# Patient Record
Sex: Female | Born: 1970
Health system: Southern US, Community
[De-identification: ages and names within clinical notes are randomized; demographics above are authoritative.]

## PROBLEM LIST (undated history)

## (undated) DIAGNOSIS — D0512 Intraductal carcinoma in situ of left breast: Secondary | ICD-10-CM

## (undated) DIAGNOSIS — Z1379 Encounter for other screening for genetic and chromosomal anomalies: Principal | ICD-10-CM

## (undated) DIAGNOSIS — I10 Essential (primary) hypertension: Secondary | ICD-10-CM

## (undated) DIAGNOSIS — R112 Nausea with vomiting, unspecified: Secondary | ICD-10-CM

## (undated) DIAGNOSIS — Z9889 Other specified postprocedural states: Secondary | ICD-10-CM

## (undated) DIAGNOSIS — D259 Leiomyoma of uterus, unspecified: Secondary | ICD-10-CM

## (undated) DIAGNOSIS — Z803 Family history of malignant neoplasm of breast: Secondary | ICD-10-CM

## (undated) DIAGNOSIS — F419 Anxiety disorder, unspecified: Secondary | ICD-10-CM

## (undated) DIAGNOSIS — Z8041 Family history of malignant neoplasm of ovary: Secondary | ICD-10-CM

## (undated) DIAGNOSIS — D649 Anemia, unspecified: Secondary | ICD-10-CM

## (undated) HISTORY — PX: MASTECTOMY: SHX3

## (undated) HISTORY — DX: Family history of malignant neoplasm of breast: Z80.3

## (undated) HISTORY — DX: Anxiety disorder, unspecified: F41.9

## (undated) HISTORY — PX: TUBAL LIGATION: SHX77

## (undated) HISTORY — DX: Family history of malignant neoplasm of ovary: Z80.41

## (undated) HISTORY — DX: Intraductal carcinoma in situ of left breast: D05.12

## (undated) HISTORY — PX: AXILLARY SURGERY: SHX892

## (undated) HISTORY — DX: Encounter for other screening for genetic and chromosomal anomalies: Z13.79

---

## 2000-07-08 ENCOUNTER — Emergency Department (HOSPITAL_COMMUNITY): Admission: EM | Admit: 2000-07-08 | Discharge: 2000-07-08 | Payer: Self-pay | Admitting: *Deleted

## 2001-08-18 ENCOUNTER — Emergency Department (HOSPITAL_COMMUNITY): Admission: EM | Admit: 2001-08-18 | Discharge: 2001-08-18 | Payer: Self-pay | Admitting: Emergency Medicine

## 2002-02-14 ENCOUNTER — Emergency Department (HOSPITAL_COMMUNITY): Admission: EM | Admit: 2002-02-14 | Discharge: 2002-02-15 | Payer: Self-pay | Admitting: Emergency Medicine

## 2002-10-24 ENCOUNTER — Inpatient Hospital Stay (HOSPITAL_COMMUNITY): Admission: AD | Admit: 2002-10-24 | Discharge: 2002-10-24 | Payer: Self-pay | Admitting: Obstetrics

## 2002-10-28 ENCOUNTER — Emergency Department (HOSPITAL_COMMUNITY): Admission: EM | Admit: 2002-10-28 | Discharge: 2002-10-28 | Payer: Self-pay | Admitting: Emergency Medicine

## 2002-12-16 ENCOUNTER — Emergency Department (HOSPITAL_COMMUNITY): Admission: EM | Admit: 2002-12-16 | Discharge: 2002-12-16 | Payer: Self-pay

## 2003-01-13 ENCOUNTER — Emergency Department (HOSPITAL_COMMUNITY): Admission: EM | Admit: 2003-01-13 | Discharge: 2003-01-13 | Payer: Self-pay | Admitting: *Deleted

## 2004-01-21 ENCOUNTER — Emergency Department (HOSPITAL_COMMUNITY): Admission: AD | Admit: 2004-01-21 | Discharge: 2004-01-21 | Payer: Self-pay | Admitting: Family Medicine

## 2004-05-13 ENCOUNTER — Emergency Department (HOSPITAL_COMMUNITY): Admission: EM | Admit: 2004-05-13 | Discharge: 2004-05-13 | Payer: Self-pay | Admitting: Family Medicine

## 2004-08-05 ENCOUNTER — Emergency Department (HOSPITAL_COMMUNITY): Admission: EM | Admit: 2004-08-05 | Discharge: 2004-08-05 | Payer: Self-pay | Admitting: Family Medicine

## 2004-12-22 ENCOUNTER — Emergency Department (HOSPITAL_COMMUNITY): Admission: EM | Admit: 2004-12-22 | Discharge: 2004-12-22 | Payer: Self-pay | Admitting: Family Medicine

## 2005-01-07 ENCOUNTER — Emergency Department (HOSPITAL_COMMUNITY): Admission: EM | Admit: 2005-01-07 | Discharge: 2005-01-07 | Payer: Self-pay | Admitting: Family Medicine

## 2005-12-02 ENCOUNTER — Emergency Department (HOSPITAL_COMMUNITY): Admission: EM | Admit: 2005-12-02 | Discharge: 2005-12-02 | Payer: Self-pay | Admitting: Family Medicine

## 2006-06-10 ENCOUNTER — Emergency Department (HOSPITAL_COMMUNITY): Admission: EM | Admit: 2006-06-10 | Discharge: 2006-06-10 | Payer: Self-pay | Admitting: Family Medicine

## 2007-12-22 ENCOUNTER — Emergency Department (HOSPITAL_COMMUNITY): Admission: EM | Admit: 2007-12-22 | Discharge: 2007-12-22 | Payer: Self-pay | Admitting: Family Medicine

## 2008-01-05 ENCOUNTER — Emergency Department (HOSPITAL_COMMUNITY): Admission: EM | Admit: 2008-01-05 | Discharge: 2008-01-05 | Payer: Self-pay | Admitting: Emergency Medicine

## 2008-01-08 ENCOUNTER — Emergency Department (HOSPITAL_COMMUNITY): Admission: EM | Admit: 2008-01-08 | Discharge: 2008-01-08 | Payer: Self-pay | Admitting: Family Medicine

## 2009-12-17 ENCOUNTER — Emergency Department (HOSPITAL_COMMUNITY): Admission: EM | Admit: 2009-12-17 | Discharge: 2009-12-18 | Payer: Self-pay | Admitting: Emergency Medicine

## 2009-12-17 ENCOUNTER — Emergency Department (HOSPITAL_COMMUNITY): Admission: EM | Admit: 2009-12-17 | Discharge: 2009-12-17 | Payer: Self-pay | Admitting: Family Medicine

## 2009-12-28 ENCOUNTER — Emergency Department (HOSPITAL_COMMUNITY): Admission: EM | Admit: 2009-12-28 | Discharge: 2009-12-28 | Payer: Self-pay | Admitting: Family Medicine

## 2010-01-16 ENCOUNTER — Ambulatory Visit: Payer: Self-pay | Admitting: Obstetrics and Gynecology

## 2010-04-05 ENCOUNTER — Emergency Department (HOSPITAL_COMMUNITY): Admission: EM | Admit: 2010-04-05 | Discharge: 2010-04-05 | Payer: Self-pay | Admitting: Family Medicine

## 2010-04-11 ENCOUNTER — Ambulatory Visit: Payer: Self-pay | Admitting: Obstetrics & Gynecology

## 2010-05-16 ENCOUNTER — Ambulatory Visit: Payer: Self-pay | Admitting: Obstetrics and Gynecology

## 2011-01-04 LAB — POCT I-STAT, CHEM 8
BUN: 8 mg/dL (ref 6–23)
Calcium, Ion: 1.07 mmol/L — ABNORMAL LOW (ref 1.12–1.32)
Chloride: 102 mEq/L (ref 96–112)
Creatinine, Ser: 0.9 mg/dL (ref 0.4–1.2)
Glucose, Bld: 95 mg/dL (ref 70–99)
HCT: 38 % (ref 36.0–46.0)
Hemoglobin: 12.9 g/dL (ref 12.0–15.0)
Potassium: 3.5 mEq/L (ref 3.5–5.1)
Sodium: 139 mEq/L (ref 135–145)
TCO2: 26 mmol/L (ref 0–100)

## 2011-01-11 LAB — POCT URINALYSIS DIP (DEVICE)
Bilirubin Urine: NEGATIVE
Glucose, UA: NEGATIVE mg/dL
Hgb urine dipstick: NEGATIVE
Nitrite: NEGATIVE
Protein, ur: 30 mg/dL — AB
Specific Gravity, Urine: 1.015 (ref 1.005–1.030)
Urobilinogen, UA: 1 mg/dL (ref 0.0–1.0)
pH: 7 (ref 5.0–8.0)

## 2011-01-11 LAB — URINALYSIS, ROUTINE W REFLEX MICROSCOPIC
Bilirubin Urine: NEGATIVE
Glucose, UA: NEGATIVE mg/dL
Hgb urine dipstick: NEGATIVE
Ketones, ur: NEGATIVE mg/dL
Nitrite: NEGATIVE
Protein, ur: NEGATIVE mg/dL
Specific Gravity, Urine: 1.028 (ref 1.005–1.030)
Urobilinogen, UA: 1 mg/dL (ref 0.0–1.0)
pH: 5.5 (ref 5.0–8.0)

## 2011-01-11 LAB — POCT PREGNANCY, URINE: Preg Test, Ur: NEGATIVE

## 2011-01-11 LAB — POCT RAPID STREP A (OFFICE): Streptococcus, Group A Screen (Direct): NEGATIVE

## 2011-01-11 LAB — SYPHILIS: RPR W/REFLEX TO RPR TITER AND TREPONEMAL ANTIBODIES, TRADITIONAL SCREENING AND DIAGNOSIS ALGORITHM: RPR Ser Ql: NONREACTIVE

## 2011-01-11 LAB — CBC
MCHC: 31.1 g/dL (ref 30.0–36.0)
MCV: 65.9 fL — ABNORMAL LOW (ref 78.0–100.0)
RDW: 21.1 % — ABNORMAL HIGH (ref 11.5–15.5)

## 2011-01-31 ENCOUNTER — Emergency Department (HOSPITAL_COMMUNITY)
Admission: EM | Admit: 2011-01-31 | Discharge: 2011-01-31 | Disposition: A | Discharge status: Home / Self Care | Payer: Self-pay | Attending: Emergency Medicine | Admitting: Emergency Medicine

## 2011-01-31 DIAGNOSIS — J309 Allergic rhinitis, unspecified: Secondary | ICD-10-CM | POA: Insufficient documentation

## 2011-01-31 DIAGNOSIS — J3489 Other specified disorders of nose and nasal sinuses: Secondary | ICD-10-CM | POA: Insufficient documentation

## 2011-01-31 DIAGNOSIS — R220 Localized swelling, mass and lump, head: Secondary | ICD-10-CM | POA: Insufficient documentation

## 2011-01-31 DIAGNOSIS — K089 Disorder of teeth and supporting structures, unspecified: Secondary | ICD-10-CM | POA: Insufficient documentation

## 2011-01-31 DIAGNOSIS — S025XXA Fracture of tooth (traumatic), initial encounter for closed fracture: Secondary | ICD-10-CM | POA: Insufficient documentation

## 2011-01-31 DIAGNOSIS — K029 Dental caries, unspecified: Secondary | ICD-10-CM | POA: Insufficient documentation

## 2011-01-31 DIAGNOSIS — X58XXXA Exposure to other specified factors, initial encounter: Secondary | ICD-10-CM | POA: Insufficient documentation

## 2011-01-31 DIAGNOSIS — Y929 Unspecified place or not applicable: Secondary | ICD-10-CM | POA: Insufficient documentation

## 2011-01-31 DIAGNOSIS — J329 Chronic sinusitis, unspecified: Secondary | ICD-10-CM | POA: Insufficient documentation

## 2011-01-31 DIAGNOSIS — R059 Cough, unspecified: Secondary | ICD-10-CM | POA: Insufficient documentation

## 2011-01-31 DIAGNOSIS — R51 Headache: Secondary | ICD-10-CM | POA: Insufficient documentation

## 2011-01-31 DIAGNOSIS — R05 Cough: Secondary | ICD-10-CM | POA: Insufficient documentation

## 2011-05-01 ENCOUNTER — Inpatient Hospital Stay (INDEPENDENT_AMBULATORY_CARE_PROVIDER_SITE_OTHER)
Admission: RE | Admit: 2011-05-01 | Discharge: 2011-05-01 | Disposition: A | Discharge status: Home / Self Care | Payer: Self-pay | Source: Ambulatory Visit | Attending: Family Medicine | Admitting: Family Medicine

## 2011-05-01 DIAGNOSIS — J069 Acute upper respiratory infection, unspecified: Secondary | ICD-10-CM

## 2011-05-01 DIAGNOSIS — R000 Tachycardia, unspecified: Secondary | ICD-10-CM

## 2011-07-13 LAB — INFLUENZA A AND B ANTIGEN (CONVERTED LAB): Inflenza A Ag: NEGATIVE

## 2011-10-20 HISTORY — PX: KNEE ARTHROSCOPY: SUR90

## 2012-05-18 ENCOUNTER — Emergency Department (HOSPITAL_COMMUNITY)
Admission: EM | Admit: 2012-05-18 | Discharge: 2012-05-18 | Disposition: A | Discharge status: Home / Self Care | Payer: Self-pay

## 2014-09-28 ENCOUNTER — Encounter (HOSPITAL_COMMUNITY): Payer: Self-pay | Admitting: Vascular Surgery

## 2014-09-28 ENCOUNTER — Observation Stay (HOSPITAL_COMMUNITY)
Admission: EM | Admit: 2014-09-28 | Discharge: 2014-09-29 | Disposition: A | Discharge status: Home / Self Care | Payer: No Typology Code available for payment source | Attending: Internal Medicine | Admitting: Internal Medicine

## 2014-09-28 DIAGNOSIS — Y9389 Activity, other specified: Secondary | ICD-10-CM | POA: Diagnosis not present

## 2014-09-28 DIAGNOSIS — Y998 Other external cause status: Secondary | ICD-10-CM | POA: Insufficient documentation

## 2014-09-28 DIAGNOSIS — D649 Anemia, unspecified: Secondary | ICD-10-CM | POA: Diagnosis not present

## 2014-09-28 DIAGNOSIS — Y92488 Other paved roadways as the place of occurrence of the external cause: Secondary | ICD-10-CM | POA: Insufficient documentation

## 2014-09-28 DIAGNOSIS — N920 Excessive and frequent menstruation with regular cycle: Secondary | ICD-10-CM | POA: Insufficient documentation

## 2014-09-28 DIAGNOSIS — R000 Tachycardia, unspecified: Secondary | ICD-10-CM | POA: Insufficient documentation

## 2014-09-28 DIAGNOSIS — D509 Iron deficiency anemia, unspecified: Secondary | ICD-10-CM | POA: Diagnosis not present

## 2014-09-28 DIAGNOSIS — D259 Leiomyoma of uterus, unspecified: Secondary | ICD-10-CM | POA: Diagnosis not present

## 2014-09-28 DIAGNOSIS — I10 Essential (primary) hypertension: Secondary | ICD-10-CM | POA: Diagnosis not present

## 2014-09-28 HISTORY — DX: Leiomyoma of uterus, unspecified: D25.9

## 2014-09-28 HISTORY — DX: Essential (primary) hypertension: I10

## 2014-09-28 MED ORDER — DIAZEPAM 5 MG PO TABS
5.0000 mg | ORAL_TABLET | Freq: Once | ORAL | Status: AC
  Administered 2014-09-28: 5 mg via ORAL
  Filled 2014-09-28: qty 1

## 2014-09-28 MED ORDER — OXYCODONE-ACETAMINOPHEN 5-325 MG PO TABS
2.0000 | ORAL_TABLET | Freq: Once | ORAL | Status: AC
  Administered 2014-09-28: 2 via ORAL
  Filled 2014-09-28: qty 2
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
## 2014-09-28 NOTE — ED Notes (Signed)
Pt ambulatory to the bathroom 
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
## 2014-09-28 NOTE — ED Notes (Signed)
Pt stating pain is better and she is "ready to go home."
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
## 2014-09-28 NOTE — ED Notes (Signed)
MD at bedside. 

## 2014-09-29 ENCOUNTER — Encounter (HOSPITAL_COMMUNITY): Payer: Self-pay | Admitting: Internal Medicine

## 2014-09-29 DIAGNOSIS — D500 Iron deficiency anemia secondary to blood loss (chronic): Secondary | ICD-10-CM

## 2014-09-29 DIAGNOSIS — N920 Excessive and frequent menstruation with regular cycle: Secondary | ICD-10-CM | POA: Diagnosis present

## 2014-09-29 DIAGNOSIS — R000 Tachycardia, unspecified: Secondary | ICD-10-CM | POA: Insufficient documentation

## 2014-09-29 DIAGNOSIS — D259 Leiomyoma of uterus, unspecified: Secondary | ICD-10-CM | POA: Diagnosis present

## 2014-09-29 DIAGNOSIS — N924 Excessive bleeding in the premenopausal period: Secondary | ICD-10-CM

## 2014-09-29 DIAGNOSIS — D649 Anemia, unspecified: Secondary | ICD-10-CM | POA: Diagnosis present

## 2014-09-29 LAB — CBC WITH DIFFERENTIAL/PLATELET
BASOS PCT: 0 % (ref 0–1)
Basophils Absolute: 0 10*3/uL (ref 0.0–0.1)
EOS ABS: 0 10*3/uL (ref 0.0–0.7)
Eosinophils Relative: 0 % (ref 0–5)
HEMATOCRIT: 25.2 % — AB (ref 36.0–46.0)
HEMOGLOBIN: 6.7 g/dL — AB (ref 12.0–15.0)
Lymphocytes Relative: 18 % (ref 12–46)
Lymphs Abs: 1.4 10*3/uL (ref 0.7–4.0)
MCH: 15.1 pg — AB (ref 26.0–34.0)
MCHC: 26.6 g/dL — AB (ref 30.0–36.0)
MCV: 56.9 fL — ABNORMAL LOW (ref 78.0–100.0)
MONO ABS: 0.5 10*3/uL (ref 0.1–1.0)
Monocytes Relative: 7 % (ref 3–12)
NEUTROS ABS: 5.7 10*3/uL (ref 1.7–7.7)
Neutrophils Relative %: 75 % (ref 43–77)
Platelets: 565 10*3/uL — ABNORMAL HIGH (ref 150–400)
RBC: 4.43 MIL/uL (ref 3.87–5.11)
RDW: 22.8 % — ABNORMAL HIGH (ref 11.5–15.5)
WBC: 7.6 10*3/uL (ref 4.0–10.5)

## 2014-09-29 LAB — BASIC METABOLIC PANEL
Anion gap: 15 (ref 5–15)
BUN: 8 mg/dL (ref 6–23)
CALCIUM: 9.2 mg/dL (ref 8.4–10.5)
CO2: 23 meq/L (ref 19–32)
CREATININE: 0.73 mg/dL (ref 0.50–1.10)
Chloride: 101 mEq/L (ref 96–112)
GFR calc Af Amer: >90 mL/min (ref >90)
GFR calc non Af Amer: >90 mL/min (ref >90)
GLUCOSE: 121 mg/dL — AB (ref 70–99)
Potassium: 3.7 mEq/L (ref 3.7–5.3)
Sodium: 139 mEq/L (ref 137–147)

## 2014-09-29 LAB — IRON AND TIBC
Iron: 181 ug/dL — ABNORMAL HIGH (ref 42–135)
Saturation Ratios: 51 % (ref 20–55)
TIBC: 357 ug/dL (ref 250–470)
UIBC: 176 ug/dL (ref 125–400)

## 2014-09-29 LAB — URINALYSIS, ROUTINE W REFLEX MICROSCOPIC
BILIRUBIN URINE: NEGATIVE
GLUCOSE, UA: NEGATIVE mg/dL
KETONES UR: NEGATIVE mg/dL
LEUKOCYTES UA: NEGATIVE
Nitrite: NEGATIVE
PH: 6 (ref 5.0–8.0)
Protein, ur: NEGATIVE mg/dL
Specific Gravity, Urine: <1.005 — ABNORMAL LOW (ref 1.005–1.030)
Urobilinogen, UA: 0.2 mg/dL (ref 0.0–1.0)

## 2014-09-29 LAB — RAPID URINE DRUG SCREEN, HOSP PERFORMED
AMPHETAMINES: NOT DETECTED
BARBITURATES: NOT DETECTED
BENZODIAZEPINES: NOT DETECTED
Cocaine: NOT DETECTED
Opiates: NOT DETECTED
Tetrahydrocannabinol: NOT DETECTED

## 2014-09-29 LAB — URINE MICROSCOPIC-ADD ON

## 2014-09-29 LAB — CBC
HCT: 29.3 % — ABNORMAL LOW (ref 36.0–46.0)
HEMATOCRIT: 26.5 % — AB (ref 36.0–46.0)
HEMOGLOBIN: 7.4 g/dL — AB (ref 12.0–15.0)
HEMOGLOBIN: 8.6 g/dL — AB (ref 12.0–15.0)
MCH: 16.9 pg — AB (ref 26.0–34.0)
MCH: 18.6 pg — ABNORMAL LOW (ref 26.0–34.0)
MCHC: 27.9 g/dL — ABNORMAL LOW (ref 30.0–36.0)
MCHC: 29.4 g/dL — ABNORMAL LOW (ref 30.0–36.0)
MCV: 60.4 fL — AB (ref 78.0–100.0)
MCV: 63.3 fL — AB (ref 78.0–100.0)
PLATELETS: 500 10*3/uL — AB (ref 150–400)
Platelets: 471 10*3/uL — ABNORMAL HIGH (ref 150–400)
RBC: 4.39 MIL/uL (ref 3.87–5.11)
RBC: 4.63 MIL/uL (ref 3.87–5.11)
RDW: 25.4 % — ABNORMAL HIGH (ref 11.5–15.5)
RDW: 26.1 % — ABNORMAL HIGH (ref 11.5–15.5)
WBC: 6 10*3/uL (ref 4.0–10.5)
WBC: 6.2 10*3/uL (ref 4.0–10.5)

## 2014-09-29 LAB — TSH: TSH: 1.29 u[IU]/mL (ref 0.350–4.500)

## 2014-09-29 LAB — PREPARE RBC (CROSSMATCH)

## 2014-09-29 LAB — TROPONIN I

## 2014-09-29 LAB — FERRITIN: FERRITIN: 4 ng/mL — AB (ref 10–291)

## 2014-09-29 LAB — ABO/RH: ABO/RH(D): A POS

## 2014-09-29 MED ORDER — FERROUS SULFATE 325 (65 FE) MG PO TABS
325.0000 mg | ORAL_TABLET | Freq: Three times a day (TID) | ORAL | Status: DC
  Administered 2014-09-29 (×3): 325 mg via ORAL
  Filled 2014-09-29 (×4): qty 1

## 2014-09-29 MED ORDER — SODIUM CHLORIDE 0.9 % IV BOLUS (SEPSIS)
2000.0000 mL | Freq: Once | INTRAVENOUS | Status: AC
  Administered 2014-09-29: 2000 mL via INTRAVENOUS

## 2014-09-29 MED ORDER — SODIUM CHLORIDE 0.9 % IV SOLN
Freq: Once | INTRAVENOUS | Status: AC
  Administered 2014-09-29: 05:00:00 via INTRAVENOUS

## 2014-09-29 MED ORDER — SODIUM CHLORIDE 0.9 % IJ SOLN
3.0000 mL | Freq: Two times a day (BID) | INTRAMUSCULAR | Status: DC
  Administered 2014-09-29 (×2): 3 mL via INTRAVENOUS

## 2014-09-29 MED ORDER — IBUPROFEN 600 MG PO TABS
600.0000 mg | ORAL_TABLET | ORAL | Status: DC | PRN
  Administered 2014-09-29: 600 mg via ORAL
  Filled 2014-09-29 (×3): qty 1

## 2014-09-29 MED ORDER — SODIUM CHLORIDE 0.9 % IV SOLN
25.0000 mg | Freq: Once | INTRAVENOUS | Status: AC
  Administered 2014-09-29: 25 mg via INTRAVENOUS
  Filled 2014-09-29: qty 0.5

## 2014-09-29 MED ORDER — SODIUM CHLORIDE 0.9 % IV SOLN
1000.0000 mg | Freq: Once | INTRAVENOUS | Status: AC
  Administered 2014-09-29: 1000 mg via INTRAVENOUS
  Filled 2014-09-29 (×2): qty 20

## 2014-09-29 MED ORDER — ONDANSETRON HCL 4 MG PO TABS
4.0000 mg | ORAL_TABLET | Freq: Four times a day (QID) | ORAL | Status: DC | PRN
  Administered 2014-09-29: 4 mg via ORAL
  Filled 2014-09-29: qty 1

## 2014-09-29 MED ORDER — HYDROCODONE-ACETAMINOPHEN 5-325 MG PO TABS: 1.0000 | ORAL_TABLET | Freq: Four times a day (QID) | ORAL | Status: DC | PRN

## 2014-09-29 MED ORDER — IBUPROFEN 600 MG PO TABS
600.0000 mg | ORAL_TABLET | Freq: Four times a day (QID) | ORAL | Status: DC | PRN
Start: 2014-09-29 — End: 2014-09-30
  Administered 2014-09-29 (×2): 600 mg via ORAL
  Filled 2014-09-29 (×3): qty 1

## 2014-09-29 MED ORDER — VITAMIN C 500 MG PO TABS
500.0000 mg | ORAL_TABLET | Freq: Three times a day (TID) | ORAL | Status: DC
  Administered 2014-09-29 (×3): 500 mg via ORAL
  Filled 2014-09-29 (×4): qty 1

## 2014-09-29 MED ORDER — ONDANSETRON HCL 4 MG/2ML IJ SOLN: 4.0000 mg | Freq: Four times a day (QID) | INTRAMUSCULAR | Status: DC | PRN

## 2014-09-29 MED ORDER — FERROUS SULFATE 325 (65 FE) MG PO TABS: 325.0000 mg | ORAL_TABLET | Freq: Every day | ORAL | Status: DC

## 2014-09-29 MED ORDER — SODIUM CHLORIDE 0.9 % IV SOLN
INTRAVENOUS | Status: DC
  Administered 2014-09-29: 08:00:00 via INTRAVENOUS

## 2014-09-29 MED ORDER — MORPHINE SULFATE 2 MG/ML IJ SOLN
2.0000 mg | INTRAMUSCULAR | Status: DC | PRN
  Administered 2014-09-29 (×3): 2 mg via INTRAVENOUS
  Filled 2014-09-29 (×3): qty 1

## 2014-09-29 NOTE — ED Notes (Signed)
MD at bedside. 
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
## 2014-09-29 NOTE — Progress Notes (Signed)
UR completed 

## 2014-09-29 NOTE — Progress Notes (Signed)
Received pt report from [place].

## 2014-09-29 NOTE — Progress Notes (Signed)
Casey Baker [id number] Admission Data: 09/29/2014 4:35 AM Attending Provider: Orvan Falconer, MD PCP:No primary care provider on file. Code Status: Full  Casey Baker is a 43 y.o. female patient admitted from ED:  -No acute distress noted.  -No complaints of shortness of breath.  -No complaints of chest pain.   Cardiac Monitoring: Box # 4 in place. Cardiac monitor yields:sinus tachycardia.  Blood pressure 116/67, pulse 103, temperature 99.1 F (37.3 C), temperature source Oral, resp. rate 16, last menstrual period 09/28/2014, SpO2 100 %.   IV Fluids:  IV in place, occlusive dsg intact without redness, IV cath wrist left, condition patent and no redness and antecubital left, condition patent and no redness none.   Allergies:  Review of patient's allergies indicates not on file.  Past Medical History:   has a past medical history of Hypertension and Uterine fibroid.  Past Surgical History:   has no past surgical history on file.  Social History:   reports that she has never smoked. She has never used smokeless tobacco. She reports that she does not drink alcohol or use illicit drugs.  Skin: NSI  Patient/Family orientated to room. Information packet given to patient/family. Admission inpatient armband information verified with patient/family to include name and date of birth and placed on patient arm. Side rails up x 2, fall assessment and education completed with patient/family. Patient/family able to verbalize understanding of risk associated with falls and verbalized understanding to call for assistance before getting out of bed. Call light within reach. Patient/family able to voice and demonstrate understanding of unit orientation instructions.
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
## 2014-09-29 NOTE — Progress Notes (Signed)
dischager instruction given to pt . VSS prior to discharge. Pt was

## 2014-09-30 ENCOUNTER — Emergency Department (HOSPITAL_COMMUNITY): Payer: No Typology Code available for payment source

## 2014-09-30 ENCOUNTER — Encounter (HOSPITAL_COMMUNITY): Payer: Self-pay | Admitting: Family Medicine

## 2014-09-30 ENCOUNTER — Emergency Department (HOSPITAL_COMMUNITY)
Admission: EM | Admit: 2014-09-30 | Discharge: 2014-09-30 | Disposition: A | Discharge status: Home / Self Care | Payer: No Typology Code available for payment source | Attending: Emergency Medicine | Admitting: Emergency Medicine

## 2014-09-30 DIAGNOSIS — M545 Low back pain, unspecified: Secondary | ICD-10-CM

## 2014-09-30 DIAGNOSIS — Y998 Other external cause status: Secondary | ICD-10-CM | POA: Diagnosis not present

## 2014-09-30 DIAGNOSIS — Z8742 Personal history of other diseases of the female genital tract: Secondary | ICD-10-CM | POA: Insufficient documentation

## 2014-09-30 DIAGNOSIS — Y92410 Unspecified street and highway as the place of occurrence of the external cause: Secondary | ICD-10-CM | POA: Insufficient documentation

## 2014-09-30 DIAGNOSIS — M62830 Muscle spasm of back: Secondary | ICD-10-CM

## 2014-09-30 DIAGNOSIS — T148 Other injury of unspecified body region: Secondary | ICD-10-CM | POA: Insufficient documentation

## 2014-09-30 DIAGNOSIS — M62838 Other muscle spasm: Secondary | ICD-10-CM

## 2014-09-30 DIAGNOSIS — Y9389 Activity, other specified: Secondary | ICD-10-CM | POA: Insufficient documentation

## 2014-09-30 DIAGNOSIS — D649 Anemia, unspecified: Secondary | ICD-10-CM | POA: Insufficient documentation

## 2014-09-30 DIAGNOSIS — I10 Essential (primary) hypertension: Secondary | ICD-10-CM | POA: Insufficient documentation

## 2014-09-30 DIAGNOSIS — T148XXA Other injury of unspecified body region, initial encounter: Secondary | ICD-10-CM

## 2014-09-30 DIAGNOSIS — M542 Cervicalgia: Secondary | ICD-10-CM

## 2014-09-30 HISTORY — DX: Anemia, unspecified: D64.9

## 2014-09-30 LAB — TYPE AND SCREEN
ABO/RH(D): A POS
Antibody Screen: NEGATIVE
Unit division: 0
Unit division: 0

## 2014-09-30 MED ORDER — CYCLOBENZAPRINE HCL 10 MG PO TABS: 10.0000 mg | ORAL_TABLET | Freq: Three times a day (TID) | ORAL | Status: DC | PRN

## 2014-09-30 MED ORDER — TRAMADOL HCL 50 MG PO TABS: 50.0000 mg | ORAL_TABLET | Freq: Four times a day (QID) | ORAL | Status: DC | PRN

## 2014-09-30 MED ORDER — CYCLOBENZAPRINE HCL 10 MG PO TABS
10.0000 mg | ORAL_TABLET | Freq: Once | ORAL | Status: AC
  Administered 2014-09-30: 10 mg via ORAL
  Filled 2014-09-30: qty 1

## 2014-09-30 MED ORDER — OXYCODONE-ACETAMINOPHEN 5-325 MG PO TABS
1.0000 | ORAL_TABLET | Freq: Once | ORAL | Status: AC
  Administered 2014-09-30: 1 via ORAL
  Filled 2014-09-30: qty 1
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
## 2014-09-30 NOTE — Progress Notes (Signed)
Casey Baker discharged to home per MD order.  Discharge instructions reviewed and discussed with the patient, all questions and concerns answered. Copy of instructions and scripts given to patient.    Medication List    STOP taking these medications        ibuprofen 200 MG tablet  Commonly known as:  ADVIL,MOTRIN      TAKE these medications        ferrous sulfate 325 (65 FE) MG tablet  Commonly known as:  FERROUSUL  Take 1 tablet (325 mg total) by mouth at bedtime.     HYDROcodone-acetaminophen 5-325 MG per tablet  Commonly known as:  NORCO  Take 1 tablet by mouth every 6 (six) hours as needed for moderate pain.     PROBIOTIC DAILY Caps  Take 1 capsule by mouth 2 (two) times daily.        Patients skin is clean, dry and intact, no evidence of skin break down. IV site discontinued . Site without signs and symptoms of complications. Dressing and pressure applied. VSS prior to discharge.   Patient escorted to car by this writer in a wheelchair,  no distress noted upon discharge. Family member present with patient. All belongings sent with pt and family member.  [name] 09/30/2014 1:16 AM
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
## 2014-09-30 NOTE — Progress Notes (Signed)
09/30/2014 4:10 PM P.J. Everton Bertha,RN, BSN case manager       In to speak with patient. Given information about the [employer] and [employer]. Explained benefits of having primary care physician. Encouraged patient to call tomorrow to make follow up appt from hospitalization. Talked with patient regarding Ferrous sulfate being on $4 list and that cost for Norco 5/325 at [facility] would be $17.65.  09/30/2014 4:03 PM P.J. Maria Gallicchio,RN, BSN case Metallurgist on [address] at [phone]. Spoke with [place] who verified that Ferrous sulfate 325 mg po is covered on $4 program. Also reported Norco 5/325 mg quantity 20 would be $17.65  09/30/2014 3:57 PM P.J. Jerrelle Michelsen,RN, BSN case manager         Received referral from [place], [place] regarding patient. Reports that patient was discharged yesterday with prescriptions for Ferrous sulfate 325 mg quantity 30 and Norco 5/325 quantity 20. PA reports that patient cannot afford medications and requests that [facility] go in and speak with patient.

## 2014-10-09 ENCOUNTER — Emergency Department (HOSPITAL_COMMUNITY)
Admission: EM | Admit: 2014-10-09 | Discharge: 2014-10-10 | Disposition: A | Discharge status: Home / Self Care | Payer: No Typology Code available for payment source | Attending: Emergency Medicine | Admitting: Emergency Medicine

## 2014-10-09 ENCOUNTER — Encounter (HOSPITAL_COMMUNITY): Payer: Self-pay | Admitting: Emergency Medicine

## 2014-10-09 DIAGNOSIS — Y9389 Activity, other specified: Secondary | ICD-10-CM | POA: Diagnosis not present

## 2014-10-09 DIAGNOSIS — Z79899 Other long term (current) drug therapy: Secondary | ICD-10-CM | POA: Insufficient documentation

## 2014-10-09 DIAGNOSIS — S99921A Unspecified injury of right foot, initial encounter: Secondary | ICD-10-CM | POA: Diagnosis present

## 2014-10-09 DIAGNOSIS — Z8742 Personal history of other diseases of the female genital tract: Secondary | ICD-10-CM | POA: Diagnosis not present

## 2014-10-09 DIAGNOSIS — D649 Anemia, unspecified: Secondary | ICD-10-CM | POA: Diagnosis not present

## 2014-10-09 DIAGNOSIS — Y92410 Unspecified street and highway as the place of occurrence of the external cause: Secondary | ICD-10-CM | POA: Diagnosis not present

## 2014-10-09 DIAGNOSIS — I10 Essential (primary) hypertension: Secondary | ICD-10-CM | POA: Diagnosis not present

## 2014-10-09 DIAGNOSIS — Y998 Other external cause status: Secondary | ICD-10-CM | POA: Insufficient documentation

## 2014-10-09 DIAGNOSIS — M62830 Muscle spasm of back: Secondary | ICD-10-CM | POA: Insufficient documentation

## 2014-10-09 MED ORDER — DIAZEPAM 5 MG PO TABS
5.0000 mg | ORAL_TABLET | Freq: Once | ORAL | Status: AC
  Administered 2014-10-10: 5 mg via ORAL
  Filled 2014-10-09: qty 1

## 2014-10-09 MED ORDER — HYDROCODONE-ACETAMINOPHEN 5-325 MG PO TABS
2.0000 | ORAL_TABLET | Freq: Once | ORAL | Status: AC
  Administered 2014-10-10: 2 via ORAL
  Filled 2014-10-09: qty 2
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
## 2014-10-10 ENCOUNTER — Emergency Department (HOSPITAL_COMMUNITY): Payer: No Typology Code available for payment source

## 2014-10-10 NOTE — Discharge Instructions (Signed)
°Emergency Department Resource Guide °1) Find a Doctor and Pay Out of Pocket °Although you won't have to find out who is covered by your insurance plan, it is a good idea to ask around and get recommendations. You will then need to call the office and see if the doctor you have chosen will accept you as a new patient and what types of options they offer for patients who are self-pay. Some doctors offer discounts or will set up payment plans for their patients who do not have insurance, but you will need to ask so you aren't surprised when you get to your appointment. ° °2) Contact Your Local Health Department °Not all health departments have doctors that can see patients for sick visits, but many do, so it is worth a call to see if yours does. If you don't know where your local health department is, you can check in your phone book. The CDC also has a tool to help you locate your state's health department, and many state websites also have listings of all of their local health departments. ° °3) Find a Walk-in Clinic °If your illness is not likely to be very severe or complicated, you may want to try a walk in clinic. These are popping up all over the country in pharmacies, drugstores, and shopping centers. They're usually staffed by nurse practitioners or physician assistants that have been trained to treat common illnesses and complaints. They're usually fairly quick and inexpensive. However, if you have serious medical issues or chronic medical problems, these are probably not your best option. ° °No Primary Care Doctor: °- Call [facility] at  832-8000 - they can help you locate a primary care doctor that  accepts your insurance, provides certain services, etc. °- Physician Referral Service- 1-800-533-3463 ° °Chronic Pain Problems: °Organization         Address  Phone   Notes  °[facility]  (336) 297-2271 Patients need to be referred by their primary care doctor.  ° °Medication  Assistance: °Organization         Address  Phone   Notes  °[place] [facility] [address] °[place], [place] [address] (336) 641-8030 --Must be a resident of [place] °-- Must have NO insurance coverage whatsoever (no Medicaid/ Medicare, etc.) °-- The pt. MUST have a primary care doctor that directs their care regularly and follows them in the community °  °[facility]  (866) 331-1348   °[employer]  (888) 892-1162   ° °Agencies that provide inexpensive medical care: °Organization         Address  Phone   Notes  °[facility]  (336) 832-8035   °[facility]    (336) 832-7272   °[facility] [address] °[place], [place] [address] (336) 832-4777   °[facility] [address], °[place] (336) 271-4999   °[facility]    (336) 373-0678   °[facility]    (336) 272-1050   °[facility] ° [address], [place] Phone:  (336) 832-4444, Fax:  (336) 832-4440 Hours of Operation:  9 am - 6 pm, M-F.  Also accepts Medicaid/Medicare and self-pay.  °[facility] ° [address], [place] Phone: (336) 832-3150, Fax: (336) 832-3151. Hours of Operation:  8:30 am - 5:30 pm, M-F.  Also accepts Medicaid and self-pay.  °[facility] [place] [address], [place] Phone: (336) 878-6027   °[facility] [address], [place], [place] (336)723-1848, Ext. 123 Mondays & Thursdays: 7-9 AM.  First 15 patients are seen on a first come, first serve basis. °  ° °Medicaid-accepting [place] Providers: ° °Organization         Address  Phone   Notes  °[facility] [address], [place] (336) 641-2100 Also accepts self-pay patients.  °[facility] [address], [place] ° (336) 856-9996   °[facility] [address], [place]  (336) 288-8857   °[facility] [address] [place] [address], [place] (336) 299-7000   °Veita Bland [address], [place]  ° (336) 373-1557 Only accepts [place] Access Medicaid patients after they have their name applied to their card.  ° °Self-Pay (no insurance) in [place]: ° °Organization         Address  Phone   Notes  °Sickle Cell Patients, [facility] [address], [place] (336) 832-1970   °[facility] [address], [place] (336) 832-4400   °[facility] ° [address],  (336) 992-4800   °[facility]/Dr. Osei-Bonsu ° [address] [place] [address], [place] or [address], [place] (336) 841-8500 Phone number for both [place] and [place] locations is the same.  °[facility] [address], [place] (336) 299-0000   °[facility] [address] [place] [address], [place] or [address] (336) 852-7530 °(336) 878-2260   °[facility] [address], [place] (336) 350-1642, phone; (336) 294-5005, fax Sees patients 1st and 3rd Saturday of every month.  Must not qualify for public or private insurance (i.e. Medicaid, Medicare, [place] Health Choice, Veterans' Benefits) • Household income should be no more than 200% of the poverty level •The clinic cannot treat you if you are pregnant or think you are pregnant • Sexually transmitted diseases are not treated at the clinic.  ° ° °Dental Care: °Organization         Address  Phone  Notes  °[place] [facility] [address], [place] (336) 641-6152 Accepts children up to age 21 who are enrolled in Medicaid or [place] Health Choice; pregnant women with a Medicaid card; and children who have applied for Medicaid or [place] Health Choice, but were declined, whose parents can pay a reduced fee at time of service.  °[place]  [facility] [place]  [address], [place] (336) 641-7733 Accepts children up to age 21 who are enrolled in Medicaid or [place] Health Choice; pregnant women with a Medicaid card; and children who have applied for Medicaid or [place] Health Choice, but were declined, whose parents can pay a reduced fee at time of service.  °[facility] ° [address],  (336) 641-4533 Patients are seen by appointment only. Walk-ins are not accepted. [facility] will see patients 18 years of age and older. °Monday - Tuesday (8am-5pm) °Most Wednesdays (8:30-5pm) °$30 per visit, cash only  °[facility] ° [address], [place] (336) 641-4533 Patients are seen by appointment only. Walk-ins are not accepted. [facility] will see patients 18 years of age and older. °One   Wednesday Evening (Monthly: Volunteer Based).  $30 per visit, cash only  °[facility]  (919) 537-3737 for adults; Children under age 4, call Graduate Pediatric Dentistry at ([phone]) [phone]. Children aged 4-14, please call (919) [phone] to request a pediatric application. ° Dental services are provided in all areas of dental care including fillings, crowns and bridges, complete and partial dentures, implants, gum treatment, root canals, and extractions. Preventive care is also provided. Treatment is provided to both adults and children. °Patients are selected via a lottery and there is often a waiting list. °  °[facility] [address], °[place] ° (336) 763-8833 [email or web address] °  °[facility] [address], [place], [place] (336)723-1848, Ext. 123 Second and Fourth Thursday of each month, opens at 6:30 AM; Clinic ends at 9 AM.  Patients are seen on a first-come first-served basis, and a limited number are seen during each clinic.  ° °[facility] ° [address], [place], [place] (336) 723-7904    Eligibility Requirements °You must have lived in [place], [place], or [place] counties for at least the last three months. °  You cannot be eligible for state or federal sponsored healthcare insurance, including Veterans Administration, Medicaid, or Medicare. °  You generally cannot be eligible for healthcare insurance through your employer.  °  How to apply: °Eligibility screenings are held every Tuesday and Wednesday afternoon from 1:00 pm until 4:00 pm. You do not need an appointment for the interview!  °[facility] [address], [place], [place] 336-631-2330   °[facility]  336-342-8273   °[facility]  336-703-3100   ° [facility]  336-570-6415   ° °Behavioral Health Resources in the Community: °Intensive Outpatient Programs °Organization         Address  Phone  Notes  °[facility] [address], [place], [place] 336-878-6098   °[facility] [address], [place], [place] 336-832-9800   °[facility] [address], [place], [place] ° 336-882-2125   °[facility] [address],  °[place], [place] 1-800-853-5163 or 336-641-4981   °Substance Abuse Resources °Organization         Address  Phone  Notes  °[facility]  336-882-2125   °[facility]  336-784-9470   °[facility]  336-285-9073   °[facility]  336-845-3988   °Residential & Outpatient Substance Abuse Program  1-800-659-3381   °Psychological Services °Organization         Address  Phone  Notes  °[facility]  336- 832-9600   °[facility]  336- 378-7881   °[facility] [address], [place] 1-800-853-5163 or 336-641-4981   ° °Mobile Crisis Teams °Organization         Address  Phone  Notes  °[facility], Mobile Crisis Care Unit  1-877-626-1772   °Assertive °[facility] ° [address]  [place], [place] 336-834-9664   °Sharon DeEsch [address] ° [place] 336-554-5454   ° °Self-Help/Support Groups °Organization         Address  Phone             Notes  °[facility]  - variety of support groups  336- 373-1402 Call for more information  °Narcotics Anonymous (NA), [facility] [address], °[place]  2 meetings at this location  ° °  Residential Treatment Programs °Organization         Address  Phone  Notes  °[facility] [address],    °[place]  1-866-801-8205   °[facility] ° [address], [place], [place] 704-293-8524   °[facility] [address], [place] 336-845-3988 Admissions: 8am-3pm M-F  °[facility] [address]    °[place], [place] 336-841-1104   °[facility] [address], [place], [place] 336-379-7146   °[facility] [address]  °[place], [place] 336-285-9073   °[facility] Intensive Outpatient [address], [place], [place] 336-852-3033   °[facility] [address]  °[place], [place] 1-877-615-2722 or 336-784-9470   °[facility] [address] , [place] 336-227-7417 Accepts Medicaid  °[facility] [address]  °[place] 1-800-659-3381 Substance Abuse/Addiction Treatment  ° °[place] Behavioral Health Resources °Organization         Address  Phone  Notes  °[facility]  (888) 581-9988   °Julie Brannon, PhD [address] [place], [place]   (336) 349-5553 or (336) 951-0000   °[facility]   [address] °[place], [place] (336) 349-4454   °[facility] [address], [place], [place] (336) 342-8316 Insurance/Medicaid/sponsorship through [facility]  °[facility] [address]                                    [place], [place] (336) 342-8316 Therapy/tele-psych/case    °[facility] [address]  ° [place],  (336) 349-2233    °Dr. Arfeen  (336) 349-4544   °[facility] [place]  [employer] [place] [facility] 1) [address],  °2) [address], [place] °3)  [address], [place] (336) 349-3220 °(336) 342-7768 ° °(336) 342-8140   °[place] [facility] (336) 342-1394 or (336) 342-3537 (After Hours)    ° ° °

## 2014-11-15 ENCOUNTER — Emergency Department (HOSPITAL_COMMUNITY): Payer: Self-pay

## 2014-11-15 ENCOUNTER — Emergency Department (HOSPITAL_COMMUNITY)
Admission: EM | Admit: 2014-11-15 | Discharge: 2014-11-15 | Disposition: A | Discharge status: Home / Self Care | Payer: Self-pay | Attending: Emergency Medicine | Admitting: Emergency Medicine

## 2014-11-15 ENCOUNTER — Encounter (HOSPITAL_COMMUNITY): Payer: Self-pay

## 2014-11-15 DIAGNOSIS — N898 Other specified noninflammatory disorders of vagina: Secondary | ICD-10-CM | POA: Insufficient documentation

## 2014-11-15 DIAGNOSIS — R000 Tachycardia, unspecified: Secondary | ICD-10-CM | POA: Insufficient documentation

## 2014-11-15 DIAGNOSIS — D649 Anemia, unspecified: Secondary | ICD-10-CM | POA: Insufficient documentation

## 2014-11-15 DIAGNOSIS — I10 Essential (primary) hypertension: Secondary | ICD-10-CM | POA: Insufficient documentation

## 2014-11-15 DIAGNOSIS — Z3202 Encounter for pregnancy test, result negative: Secondary | ICD-10-CM | POA: Insufficient documentation

## 2014-11-15 DIAGNOSIS — D259 Leiomyoma of uterus, unspecified: Secondary | ICD-10-CM | POA: Insufficient documentation

## 2014-11-15 LAB — BASIC METABOLIC PANEL
ANION GAP: 12 (ref 5–15)
BUN: 7 mg/dL (ref 6–23)
CO2: 22 mmol/L (ref 19–32)
CREATININE: 0.83 mg/dL (ref 0.50–1.10)
Calcium: 9.5 mg/dL (ref 8.4–10.5)
Chloride: 104 mmol/L (ref 96–112)
GFR calc Af Amer: >90 mL/min (ref >90)
GFR calc non Af Amer: 85 mL/min — ABNORMAL LOW (ref >90)
Glucose, Bld: 92 mg/dL (ref 70–99)
Potassium: 3.4 mmol/L — ABNORMAL LOW (ref 3.5–5.1)
SODIUM: 138 mmol/L (ref 135–145)

## 2014-11-15 LAB — WET PREP, GENITAL
CLUE CELLS WET PREP: NONE SEEN
TRICH WET PREP: NONE SEEN
WBC, Wet Prep HPF POC: NONE SEEN
YEAST WET PREP: NONE SEEN

## 2014-11-15 LAB — RAPID URINE DRUG SCREEN, HOSP PERFORMED
Amphetamines: NOT DETECTED
Barbiturates: NOT DETECTED
Benzodiazepines: NOT DETECTED
COCAINE: NOT DETECTED
Opiates: NOT DETECTED
Tetrahydrocannabinol: NOT DETECTED

## 2014-11-15 LAB — CBC
HEMATOCRIT: 42.2 % (ref 36.0–46.0)
Hemoglobin: 13.6 g/dL (ref 12.0–15.0)
MCH: 24.6 pg — ABNORMAL LOW (ref 26.0–34.0)
MCHC: 32.2 g/dL (ref 30.0–36.0)
MCV: 76.4 fL — AB (ref 78.0–100.0)
Platelets: 279 10*3/uL (ref 150–400)
RBC: 5.52 MIL/uL — AB (ref 3.87–5.11)
WBC: 5.5 10*3/uL (ref 4.0–10.5)

## 2014-11-15 LAB — URINE MICROSCOPIC-ADD ON: RBC / HPF: NONE SEEN RBC/hpf (ref <3)

## 2014-11-15 LAB — URINALYSIS, ROUTINE W REFLEX MICROSCOPIC
BILIRUBIN URINE: NEGATIVE
Glucose, UA: NEGATIVE mg/dL
Hgb urine dipstick: NEGATIVE
Ketones, ur: 15 mg/dL — AB
Leukocytes, UA: NEGATIVE
Nitrite: NEGATIVE
PH: 7 (ref 5.0–8.0)
Protein, ur: 30 mg/dL — AB
SPECIFIC GRAVITY, URINE: 1.025 (ref 1.005–1.030)
UROBILINOGEN UA: 1 mg/dL (ref 0.0–1.0)

## 2014-11-15 LAB — PREGNANCY, URINE: PREG TEST UR: NEGATIVE

## 2014-11-15 MED ORDER — IOHEXOL 300 MG/ML  SOLN: 25.0000 mL | Freq: Once | INTRAMUSCULAR | Status: DC | PRN

## 2014-11-15 MED ORDER — LORAZEPAM 2 MG/ML IJ SOLN
1.0000 mg | Freq: Once | INTRAMUSCULAR | Status: AC
  Administered 2014-11-15: 1 mg via INTRAVENOUS
  Filled 2014-11-15: qty 1

## 2014-11-15 MED ORDER — IOHEXOL 300 MG/ML  SOLN
100.0000 mL | Freq: Once | INTRAMUSCULAR | Status: AC | PRN
  Administered 2014-11-15: 100 mL via INTRAVENOUS

## 2014-11-15 MED ORDER — SODIUM CHLORIDE 0.9 % IV BOLUS (SEPSIS)
1000.0000 mL | Freq: Once | INTRAVENOUS | Status: AC
  Administered 2014-11-15: 1000 mL via INTRAVENOUS

## 2014-11-15 MED ORDER — LORAZEPAM 1 MG PO TABS: 1.0000 mg | ORAL_TABLET | Freq: Once | ORAL | Status: DC

## 2014-11-15 NOTE — ED Provider Notes (Signed)
CSN: [id number]     Arrival date & time 11/15/14  1657 History   First MD Initiated Contact with Patient 11/15/14 1714     Chief Complaint  Patient presents with  . Vaginal Discharge     (Consider location/radiation/quality/duration/timing/severity/associated sxs/prior Treatment) Patient is a 44 y.o. female presenting with vaginal discharge. The history is provided by the patient. No language interpreter was used.  Vaginal Discharge Quality:  Thin and watery Onset quality:  Gradual Duration:  2 days Timing:  Constant Progression:  Unchanged Chronicity:  New Context: spontaneously   Context: not after intercourse and not after urination   Relieved by:  Nothing Worsened by:  Nothing tried Ineffective treatments:  None tried Associated symptoms: no abdominal pain, no dysuria, no fever, no genital lesions, no nausea, no urinary frequency, no urinary hesitancy, no urinary incontinence, no vaginal itching and no vomiting   Risk factors: new sexual partner and unprotected sex   Risk factors: no endometriosis, no gynecological surgery, no STI, no STI exposure and no terminated pregnancy     Past Medical History  Diagnosis Date  . Hypertension   . Uterine fibroid   . Anemia    History reviewed. No pertinent past surgical history. History reviewed. No pertinent family history. History  Substance Use Topics  . Smoking status: Never Smoker   . Smokeless tobacco: Never Used  . Alcohol Use: No   OB History    No data available     Review of Systems  Constitutional: Negative for fever.  Respiratory: Negative for cough and shortness of breath.   Gastrointestinal: Negative for nausea, vomiting and abdominal pain.  Genitourinary: Positive for vaginal discharge. Negative for bladder incontinence, dysuria, hesitancy, urgency, frequency and pelvic pain.  Musculoskeletal: Negative for back pain and neck pain.  Skin: Negative for color change.  All other systems reviewed and are  negative.     Allergies  Review of patient's allergies indicates no known allergies.  Home Medications   Prior to Admission medications   Medication Sig Start Date End Date Taking? Authorizing Provider  acetaminophen (TYLENOL) 325 MG tablet Take 650 mg by mouth every 6 (six) hours as needed for mild pain.   Yes Historical Provider, MD  cyclobenzaprine (FLEXERIL) 10 MG tablet Take 1 tablet (10 mg total) by mouth 3 (three) times daily as needed for muscle spasms. Patient not taking: Reported on 11/15/2014 09/30/14   Patty Sermons Camprubi-Soms, PA-C  ferrous sulfate (FERROUSUL) 325 (65 FE) MG tablet Take 1 tablet (325 mg total) by mouth at bedtime. Patient not taking: Reported on 11/15/2014 09/29/14   [name], MD  HYDROcodone-acetaminophen (NORCO) 5-325 MG per tablet Take 1 tablet by mouth every 6 (six) hours as needed for moderate pain. Patient not taking: Reported on 11/15/2014 09/29/14   [name], MD  traMADol (ULTRAM) 50 MG tablet Take 1 tablet (50 mg total) by mouth every 6 (six) hours as needed. Patient not taking: Reported on 11/15/2014 09/30/14   Patty Sermons Camprubi-Soms, PA-C   BP 128/72 mmHg  Pulse 18  Temp(Src) 98.1 F (36.7 C) (Oral)  Resp 18  SpO2 98%  LMP 10/19/2014 Physical Exam  Constitutional: She is oriented to person, place, and time. She appears well-developed and well-nourished. No distress.  HENT:  Head: Normocephalic and atraumatic.  Eyes: Pupils are equal, round, and reactive to light.  Neck: Normal range of motion.  Cardiovascular: Regular rhythm, normal heart sounds and intact distal pulses.  Tachycardia present.   Pulmonary/Chest: Effort normal. No  respiratory distress. She has no wheezes. She exhibits no tenderness.  Abdominal: Soft. Bowel sounds are normal. She exhibits no distension. There is no tenderness. There is no rebound and no guarding.  Neurological: She is alert and oriented to person, place, and time. She has normal strength. No  cranial nerve deficit or sensory deficit. She exhibits normal muscle tone. Coordination and gait normal.  Skin: Skin is warm and dry.  Nursing note and vitals reviewed.   ED Course  Procedures (including critical care time) Labs Review Labs Reviewed  URINALYSIS, ROUTINE W REFLEX MICROSCOPIC - Abnormal; Notable for the following:    Ketones, ur 15 (*)    Protein, ur 30 (*)    All other components within normal limits  CBC - Abnormal; Notable for the following:    RBC 5.52 (*)    MCV 76.4 (*)    MCH 24.6 (*)    All other components within normal limits  BASIC METABOLIC PANEL - Abnormal; Notable for the following:    Potassium 3.4 (*)    GFR calc non Af Amer 85 (*)    All other components within normal limits  WET PREP, GENITAL  PREGNANCY, URINE  URINE MICROSCOPIC-ADD ON  URINE RAPID DRUG SCREEN (HOSP PERFORMED)  GC/CHLAMYDIA PROBE AMP ([place])    Imaging Review Ct Abdomen Pelvis W Contrast  11/15/2014   CLINICAL DATA:  Vaginal discharge.  EXAM: CT ABDOMEN AND PELVIS WITH CONTRAST  TECHNIQUE: Multidetector CT imaging of the abdomen and pelvis was performed using the standard protocol following bolus administration of intravenous contrast.  CONTRAST:  144mL OMNIPAQUE IOHEXOL 300 MG/ML  SOLN  COMPARISON:  Ultrasound 12/18/2009  FINDINGS: Lower chest:  No significant abnormalities  Hepatobiliary: There are unremarkable appearances of the liver, gallbladder and bile ducts.  Pancreas: Normal  Spleen: Normal  Adrenals/Urinary Tract: The adrenals and kidneys are normal in appearance. There is moderate right hydronephrosis and mild left hydronephrosis. Both ureters are dilated down to the level of the enlarged uterus. No urinary calculi are evident.  Stomach/Bowel: There are unremarkable appearances of the stomach, small bowel and colon.  Vascular/Lymphatic: The abdominal aorta is normal in caliber. There is no atherosclerotic calcification. There is no adenopathy in the abdomen or pelvis.   Reproductive: There is marked uterine enlargement, measuring 12 by 19 x 20 centimeters. The uterus contains numerous masses, some of which are calcified. This likely represents a multi fibroid uterus. It is larger than on the ultrasound of 12/18/2009 when it measured 7.4 x 8.6 x 15.6 centimeters  Other: There is no ascites. No acute inflammatory changes are evident in the abdomen or pelvis.  Musculoskeletal: Unremarkable  IMPRESSION: 1. Very large multi fibroid uterus, measuring up to 20 centimeters. 2. Bilateral hydronephrosis, probably due to compression by the markedly enlarged uterus.   Electronically Signed   By: Andreas Newport M.D.   On: 11/15/2014 22:19     EKG Interpretation None      MDM   Final diagnoses:  Vaginal discharge  Uterine leiomyoma, unspecified location   Patient is a 44 year old African-American female with pertinent past medical history of uterine fibroids who comes to the emergency department today with a thin vaginal discharge for the past 2 months who comes to the emergency department because she is concerned that she may have an STD. Physical exam as above. Patient is significantly tachycardic with a heart rate of 140 upon arrival. She has a history of heavy vaginal bleeding requiring blood transfusions as result a  CBC was obtained to evaluate for anemia. Last time patient had a heavy period was approximately a month ago. Patient was treated with a liter of normal saline and a milligram of Ativan because patient has significant history of anxiety when she is in the hospital.  Initial workup included a CBC, BMP, UA, urine pregnancy, wet prep, and an EKG. EKG is detailed above. CBC had hemoglobin of 13 otherwise unremarkable. BMP was unremarkable. UA was negative. Urine pregnancy was negative. Wet prep was unremarkable. Patient had a benign pelvic exam with the exception of his significantly enlarged uterus consistent with her history of uterine fibroids as result I doubt  PID or cervicitis and do not feel the patient requires empiric treatment at this time since she has no other evidence of STDs. Patient does not have any exposures to STDs either. Patient remained persistently tachycardic in the emergency department despite treatment with Ativan and normal saline. As result she was administered another bolus of normal saline and another milligram of Ativan. After completion of this patient still remained tachycardic in the 140s.  Patient is very anxious appearing as a result a UDS was added to the urine and a CT of her abdomen and pelvis was obtained to evaluate for her large uterine mass to ensure that there was no other abdominal abnormalities contributing to her tachycardia. Patient is afebrile she does not have any cough or shortness of breath as result I doubt pneumonia causing her tachycardia. She does not have any shortness of breath or chest pain as result I doubt a PE. CT of the abdomen and pelvis demonstrated a large multi fibroid uterus measuring 20 cm there is bilateral hydronephrosis. The patient does not have any elevation in creatinine and no flank pain as result I doubt that this is the cause of her tachycardia. After the CT scan patient was again observed she had significant decrease in her heart rate down to 110 and her heart rate and remained here for approximately 30 minutes.  This heart rate is consistent with previous evaluations in the emergency department. As result I feel that the patient is stable for discharge at this time. She was instructed to return to the emergency department with shortness of breath, fevers, chills, worsening abdominal pain, vaginal bleeding, or any other concerns. The patient expressed understanding. She was discharged in a good condition. Labs and imaging reviewed by myself and considered in medical decision making. Imaging was interpreted by Radiology. Care was discussed with my attending Dr. Regenia Skeeter.     Katheren Shams,  MD 11/16/14 [place], MD 11/24/14 Laureen Abrahams

## 2014-11-15 NOTE — ED Notes (Signed)
Pt reporting intermittent vaginal discharge since October.  Sts it had an odor once.  Partner awaiting results for STDs.

## 2014-11-15 NOTE — Discharge Instructions (Signed)
Uterine Fibroid A uterine fibroid is a growth (tumor) that occurs in your uterus. This type of tumor is not cancerous and does not spread out of the uterus. You can have one or many fibroids. Fibroids can vary in size, weight, and where they grow in the uterus. Some can become quite large. Most fibroids do not require medical treatment, but some can cause pain or heavy bleeding during and between periods. CAUSES  A fibroid is the result of a single uterine cell that keeps growing (unregulated), which is different than most cells in the human body. Most cells have a control mechanism that keeps them from reproducing without control.  SIGNS AND SYMPTOMS   Bleeding.  Pelvic pain and pressure.  Bladder problems due to the size of the fibroid.  Infertility and miscarriages depending on the size and location of the fibroid. DIAGNOSIS  Uterine fibroids are diagnosed through a physical exam. Your health care provider may feel the lumpy tumors during a pelvic exam. Ultrasonography may be done to get information regarding size, location, and number of tumors.  TREATMENT   Your health care provider may recommend watchful waiting. This involves getting the fibroid checked by your health care provider to see if it grows or shrinks.   Hormone treatment or an intrauterine device (IUD) may be prescribed.   Surgery may be needed to remove the fibroids (myomectomy) or the uterus (hysterectomy). This depends on your situation. When fibroids interfere with fertility and a woman wants to become pregnant, a health care provider may recommend having the fibroids removed.  HOME CARE INSTRUCTIONS  Home care depends on how you were treated. In general:   Keep all follow-up appointments with your health care provider.   Only take over-the-counter or prescription medicines as directed by your health care provider. If you were prescribed a hormone treatment, take the hormone medicines exactly as directed. Do not  take aspirin. It can cause bleeding.   Talk to your health care provider about taking iron pills.  If your periods are troublesome but not so heavy, lie down with your feet raised slightly above your heart. Place cold packs on your lower abdomen.   If your periods are heavy, write down the number of pads or tampons you use per month. Bring this information to your health care provider.   Include green vegetables in your diet.  SEEK IMMEDIATE MEDICAL CARE IF:  You have pelvic pain or cramps not controlled with medicines.   You have a sudden increase in pelvic pain.   You have an increase in bleeding between and during periods.   You have excessive periods and soak tampons or pads in a half hour or less.  You feel lightheaded or have fainting episodes. Document Released: 10/02/2000 Document Revised: 07/26/2013 Document Reviewed: 05/04/2013 Exit[employer] Patient Information 2015 [employer]. This information is not intended to replace advice given to you by your health care provider. Make sure you discuss any questions you have with your health care provider.  

## 2014-11-15 NOTE — ED Notes (Signed)
HR increased to 145 - pt states she is anxious.  MD notified.

## 2014-11-16 LAB — GC/CHLAMYDIA PROBE AMP (~~LOC~~) NOT AT ARMC
Chlamydia: NEGATIVE
Neisseria Gonorrhea: NEGATIVE

## 2014-12-03 ENCOUNTER — Ambulatory Visit: Payer: Self-pay | Admitting: Internal Medicine

## 2014-12-06 ENCOUNTER — Other Ambulatory Visit (HOSPITAL_COMMUNITY): Payer: Self-pay | Admitting: Chiropractic Medicine

## 2014-12-06 DIAGNOSIS — G8929 Other chronic pain: Secondary | ICD-10-CM

## 2014-12-10 ENCOUNTER — Ambulatory Visit: Payer: Self-pay | Admitting: Internal Medicine

## 2014-12-10 ENCOUNTER — Ambulatory Visit (HOSPITAL_COMMUNITY)
Admission: RE | Admit: 2014-12-10 | Discharge: 2014-12-10 | Disposition: A | Discharge status: Home / Self Care | Payer: No Typology Code available for payment source | Source: Ambulatory Visit | Attending: Chiropractic Medicine | Admitting: Chiropractic Medicine

## 2014-12-10 DIAGNOSIS — M47896 Other spondylosis, lumbar region: Secondary | ICD-10-CM | POA: Insufficient documentation

## 2014-12-10 DIAGNOSIS — G8929 Other chronic pain: Secondary | ICD-10-CM

## 2014-12-10 DIAGNOSIS — D259 Leiomyoma of uterus, unspecified: Secondary | ICD-10-CM | POA: Insufficient documentation

## 2015-04-23 ENCOUNTER — Encounter (HOSPITAL_COMMUNITY): Payer: Self-pay | Admitting: Physical Medicine and Rehabilitation

## 2015-04-23 ENCOUNTER — Emergency Department (HOSPITAL_COMMUNITY)
Admission: EM | Admit: 2015-04-23 | Discharge: 2015-04-23 | Disposition: A | Discharge status: Home / Self Care | Payer: Self-pay | Attending: Emergency Medicine | Admitting: Emergency Medicine

## 2015-04-23 DIAGNOSIS — D649 Anemia, unspecified: Secondary | ICD-10-CM | POA: Insufficient documentation

## 2015-04-23 DIAGNOSIS — K088 Other specified disorders of teeth and supporting structures: Secondary | ICD-10-CM | POA: Insufficient documentation

## 2015-04-23 DIAGNOSIS — Z86018 Personal history of other benign neoplasm: Secondary | ICD-10-CM | POA: Insufficient documentation

## 2015-04-23 DIAGNOSIS — Z791 Long term (current) use of non-steroidal anti-inflammatories (NSAID): Secondary | ICD-10-CM | POA: Insufficient documentation

## 2015-04-23 DIAGNOSIS — I10 Essential (primary) hypertension: Secondary | ICD-10-CM | POA: Insufficient documentation

## 2015-04-23 DIAGNOSIS — K0889 Other specified disorders of teeth and supporting structures: Secondary | ICD-10-CM

## 2015-04-23 LAB — BASIC METABOLIC PANEL
Anion gap: 10 (ref 5–15)
CHLORIDE: 103 mmol/L (ref 101–111)
CO2: 23 mmol/L (ref 22–32)
Calcium: 8.9 mg/dL (ref 8.9–10.3)
Creatinine, Ser: 0.81 mg/dL (ref 0.44–1.00)
GFR calc Af Amer: >60 mL/min (ref >60)
GFR calc non Af Amer: >60 mL/min (ref >60)
GLUCOSE: 96 mg/dL (ref 65–99)
Potassium: 3.5 mmol/L (ref 3.5–5.1)
SODIUM: 136 mmol/L (ref 135–145)

## 2015-04-23 LAB — CBC WITH DIFFERENTIAL/PLATELET
BASOS ABS: 0 10*3/uL (ref 0.0–0.1)
Basophils Relative: 0 % (ref 0–1)
Eosinophils Absolute: 0 10*3/uL (ref 0.0–0.7)
Eosinophils Relative: 0 % (ref 0–5)
HCT: 37.5 % (ref 36.0–46.0)
Hemoglobin: 12.2 g/dL (ref 12.0–15.0)
Lymphocytes Relative: 20 % (ref 12–46)
Lymphs Abs: 1.5 10*3/uL (ref 0.7–4.0)
MCH: 27.4 pg (ref 26.0–34.0)
MCHC: 32.5 g/dL (ref 30.0–36.0)
MCV: 84.1 fL (ref 78.0–100.0)
Monocytes Absolute: 0.9 10*3/uL (ref 0.1–1.0)
Monocytes Relative: 12 % (ref 3–12)
NEUTROS ABS: 4.9 10*3/uL (ref 1.7–7.7)
NEUTROS PCT: 68 % (ref 43–77)
PLATELETS: 311 10*3/uL (ref 150–400)
RBC: 4.46 MIL/uL (ref 3.87–5.11)
RDW: 14.9 % (ref 11.5–15.5)
WBC: 7.3 10*3/uL (ref 4.0–10.5)

## 2015-04-23 LAB — I-STAT CHEM 8, ED
BUN: 4 mg/dL — AB (ref 6–20)
Calcium, Ion: 1.12 mmol/L (ref 1.12–1.23)
Chloride: 102 mmol/L (ref 101–111)
Creatinine, Ser: 0.8 mg/dL (ref 0.44–1.00)
Glucose, Bld: 104 mg/dL — ABNORMAL HIGH (ref 65–99)
HCT: 40 % (ref 36.0–46.0)
Hemoglobin: 13.6 g/dL (ref 12.0–15.0)
Potassium: 4.2 mmol/L (ref 3.5–5.1)
Sodium: 138 mmol/L (ref 135–145)
TCO2: 24 mmol/L (ref 0–100)

## 2015-04-23 MED ORDER — HYDROCODONE-ACETAMINOPHEN 5-325 MG PO TABS
2.0000 | ORAL_TABLET | Freq: Once | ORAL | Status: AC
  Administered 2015-04-23: 2 via ORAL
  Filled 2015-04-23: qty 2

## 2015-04-23 MED ORDER — LORAZEPAM 2 MG/ML IJ SOLN
1.0000 mg | Freq: Once | INTRAMUSCULAR | Status: AC
  Administered 2015-04-23: 1 mg via INTRAVENOUS
  Filled 2015-04-23: qty 1

## 2015-04-23 MED ORDER — HYDROCODONE-ACETAMINOPHEN 5-325 MG PO TABS: 1.0000 | ORAL_TABLET | Freq: Four times a day (QID) | ORAL | Status: DC | PRN

## 2015-04-23 MED ORDER — MORPHINE SULFATE 4 MG/ML IJ SOLN
4.0000 mg | Freq: Once | INTRAMUSCULAR | Status: AC
  Administered 2015-04-23: 4 mg via INTRAVENOUS
  Filled 2015-04-23: qty 1

## 2015-04-23 MED ORDER — PENICILLIN V POTASSIUM 250 MG PO TABS: 250.0000 mg | ORAL_TABLET | Freq: Four times a day (QID) | ORAL | Status: AC

## 2015-04-23 MED ORDER — SODIUM CHLORIDE 0.9 % IV BOLUS (SEPSIS)
1000.0000 mL | Freq: Once | INTRAVENOUS | Status: AC
  Administered 2015-04-23: 1000 mL via INTRAVENOUS

## 2015-04-23 NOTE — ED Notes (Signed)
Pt reports R lower dental pain. Ongoing for several days. 9/10 pain upon arrival to ED. Respirations unlabored. NAD

## 2015-04-23 NOTE — ED Notes (Signed)
Attempted to place IV in pt not successful 1st attempt. Pt states that every time she needs a IV she states that IV team has to place her IV.

## 2015-04-23 NOTE — ED Notes (Signed)
Pt states that she has some teeth that need to be pulled and that she is getting prepared for dentures and partials. She states that the pain started Saturday.

## 2015-04-23 NOTE — ED Provider Notes (Signed)
CSN: [id number]     Arrival date & time 04/23/15  1123 History   First MD Initiated Contact with Patient 04/23/15 1213     Chief Complaint  Patient presents with  . Dental Pain     (Consider location/radiation/quality/duration/timing/severity/associated sxs/prior Treatment) HPI Comments: Patient presents to the emergency department with chief complaint of dental pain. She states that she has had right lower dental pain for the past 3 days. Started after she had a piece of chicken. She rates pain as a 10 out of 10. With palpation and with chewing. She reports mild associated facial swelling. She denies any fevers, chills, difficulty swallowing, difficulty breathing, or vomiting. She does not have a dentist. She has tried tea bags and saltwater rinses. She has not tried anything additional for her symptoms. She was moved to the acute side because of tachycardia.  Patient states that she always has a fast heart rate. States that her heart rate never drops below 130.  States that every time she comes to the emergency department "they make a big deal about her heart rate," but that she attributes this to anxiety.  She has never seen a cardiologist.  The history is provided by the patient. No language interpreter was used.    Past Medical History  Diagnosis Date  . Hypertension   . Uterine fibroid   . Anemia    History reviewed. No pertinent past surgical history. No family history on file. History  Substance Use Topics  . Smoking status: Never Smoker   . Smokeless tobacco: Never Used  . Alcohol Use: No   OB History    No data available     Review of Systems  Constitutional: Negative for fever and chills.  HENT: Positive for dental problem. Negative for drooling.   Neurological: Negative for speech difficulty.  Psychiatric/Behavioral: Positive for sleep disturbance.      Allergies  Review of patient's allergies indicates no known allergies.  Home Medications   Prior to  Admission medications   Medication Sig Start Date End Date Taking? Authorizing Provider  cyclobenzaprine (FLEXERIL) 10 MG tablet Take 1 tablet (10 mg total) by mouth 3 (three) times daily as needed for muscle spasms. 09/30/14  Yes Mercedes Camprubi-Soms, PA-C  naproxen (NAPROSYN) 375 MG tablet Take 375 mg by mouth 2 (two) times daily with a meal. 03/20/15  Yes Historical Provider, MD  ferrous sulfate (FERROUSUL) 325 (65 FE) MG tablet Take 1 tablet (325 mg total) by mouth at bedtime. Patient not taking: Reported on 11/15/2014 09/29/14   [name], MD  HYDROcodone-acetaminophen (NORCO) 5-325 MG per tablet Take 1 tablet by mouth every 6 (six) hours as needed for moderate pain. Patient not taking: Reported on 11/15/2014 09/29/14   [name], MD  traMADol (ULTRAM) 50 MG tablet Take 1 tablet (50 mg total) by mouth every 6 (six) hours as needed. Patient not taking: Reported on 11/15/2014 09/30/14   Mercedes Camprubi-Soms, PA-C   BP 144/87 mmHg  Pulse 134  Temp(Src) 98.4 F (36.9 C) (Oral)  Resp 18  SpO2 98% Physical Exam  Constitutional: She is oriented to person, place, and time. She appears well-developed and well-nourished.  HENT:  Head: Normocephalic and atraumatic.  Mouth/Throat:    Poor dentition throughout.  Affected tooth as diagrammed.  No signs of peritonsillar or tonsillar abscess.  No signs of gingival abscess. Oropharynx is clear and without exudates.  Uvula is midline.  Airway is intact. No signs of Ludwig's angina with palpation of oral and  sublingual mucosa.   Eyes: Conjunctivae and EOM are normal.  Neck: Normal range of motion.  Cardiovascular: Normal rate.   Pulmonary/Chest: Effort normal.  Abdominal: She exhibits no distension.  Musculoskeletal: Normal range of motion.  Neurological: She is alert and oriented to person, place, and time.  Skin: Skin is dry.  Psychiatric: She has a normal mood and affect. Her behavior is normal. Judgment and thought content normal.    Nursing note and vitals reviewed.   ED Course  Procedures (including critical care time) Results for orders placed or performed during the hospital encounter of 04/23/15  CBC with Differential  Result Value Ref Range   WBC 7.3 4.0 - 10.5 K/uL   RBC 4.46 3.87 - 5.11 MIL/uL   Hemoglobin 12.2 12.0 - 15.0 g/dL   HCT 37.5 36.0 - 46.0 %   MCV 84.1 78.0 - 100.0 fL   MCH 27.4 26.0 - 34.0 pg   MCHC 32.5 30.0 - 36.0 g/dL   RDW 14.9 11.5 - 15.5 %   Platelets 311 150 - 400 K/uL   Neutrophils Relative % 68 43 - 77 %   Neutro Abs 4.9 1.7 - 7.7 K/uL   Lymphocytes Relative 20 12 - 46 %   Lymphs Abs 1.5 0.7 - 4.0 K/uL   Monocytes Relative 12 3 - 12 %   Monocytes Absolute 0.9 0.1 - 1.0 K/uL   Eosinophils Relative 0 0 - 5 %   Eosinophils Absolute 0.0 0.0 - 0.7 K/uL   Basophils Relative 0 0 - 1 %   Basophils Absolute 0.0 0.0 - 0.1 K/uL  Basic metabolic panel  Result Value Ref Range   Sodium 136 135 - 145 mmol/L   Potassium 3.5 3.5 - 5.1 mmol/L   Chloride 103 101 - 111 mmol/L   CO2 23 22 - 32 mmol/L   Glucose, Bld 96 65 - 99 mg/dL   BUN <5 (L) 6 - 20 mg/dL   Creatinine, Ser 0.81 0.44 - 1.00 mg/dL   Calcium 8.9 8.9 - 10.3 mg/dL   GFR calc non Af Amer >60 >60 mL/min   GFR calc Af Amer >60 >60 mL/min   Anion gap 10 5 - 15  I-stat chem 8, ed  Result Value Ref Range   Sodium 138 135 - 145 mmol/L   Potassium 4.2 3.5 - 5.1 mmol/L   Chloride 102 101 - 111 mmol/L   BUN 4 (L) 6 - 20 mg/dL   Creatinine, Ser 0.80 0.44 - 1.00 mg/dL   Glucose, Bld 104 (H) 65 - 99 mg/dL   Calcium, Ion 1.12 1.12 - 1.23 mmol/L   TCO2 24 0 - 100 mmol/L   Hemoglobin 13.6 12.0 - 15.0 g/dL   HCT 40.0 36.0 - 46.0 %   No results found.   Imaging Review No results found.   EKG Interpretation   Date/Time:  Tuesday April 23 2015 14:15:08 EDT Ventricular Rate:  142 PR Interval:  128 QRS Duration: 72 QT Interval:  315 QTC Calculation: 484 R Axis:   22 Text Interpretation:  Sinus tachycardia Borderline T  abnormalities,  lateral leads Sinus tachycardia T wave abnormality abn Confirmed by  Carmin Muskrat  MD [phone]) on 04/23/2015 2:22:53 PM      MDM   Final diagnoses:  Pain, dental    Patient with toothache.  No gross abscess.  Exam unconcerning for Ludwig's angina or spread of infection.  Will treat with penicillin and pain meds.  Urged patient to follow-up with dentist.  Patient has been seen by and discussed with Dr. Vanita Panda.  Patient does not have any evidence of large abscess on my exam.  Patient is very well appearing.  She is afebrile.  Labs are reassuring.  Patient seen by and discussed with Dr. Vanita Panda, who agrees that there is no indication for imaging at this time and that patient needs to follow-up with her dentist.  Will give the patient abx and pain meds.  Recommend close follow-up with dentist and possibly cardiologist. In reviewing the patient's medical record, she has multiple episodes of tachycardia >130s.  She states that this is normal for here.  States that she always gets some hydrocodone and it gets better.  She asks if she needs to take this daily.  Patient could have some opiate withdrawal vs anxiety.  Patient again discussed with Dr. Vanita Panda, who agrees with plan for abx and some pain medicine.  Will recommend dental follow-up.      Montine Circle, PA-C 04/23/15 1544  Carmin Muskrat, MD 04/24/15 1600

## 2015-04-23 NOTE — Discharge Instructions (Signed)

## 2015-04-23 NOTE — ED Notes (Signed)
Pt states she drove here today. Advised patient that she should not drive. Offered bus pass. She refused. Advised she should wait in lobby for some time and attempt to get a ride. Pt agreed.

## 2015-04-23 NOTE — ED Provider Notes (Signed)
MSE was initiated and I personally evaluated the patient and placed orders (if any) at  12:25 PM on April 23, 2015.   BP 153/92 mmHg  Pulse 150  Temp(Src) 98.8 F (37.1 C) (Oral)  Resp 18  SpO2 100%  Casey Baker is a 44 y.o. female, with a h/o HTN, who presents to the Emergency Department complaining of constant right lower dental pain for the past 3 days. Pt states that pain started after eating a piece of chicken 3 days ago. She currently rates her pain as a 10/10 aching, non-radiating sensation that is exacerbated with palpation and pressure. She reports associated right facial swelling, gum swelling and HA. Pt has tried tea bags, salt rinse and ice to the affected area with mild relief. She denies fever, chills, trouble swallowing, trismus, bleeding from gums, drooling, chest pain, SOB, visual disturbances, light-headedness, abdominal pain, nausea, vomiting, dysuria, hematuria, vaginal discharge or bleeding, weakness, numbness/tingling.   On exam, pt tachycardic in the 150s and has had normal HRs in the past (70-80s), could be from pain but facial swelling very extensive on the R side, and concern for abscess and early sepsis is raised. Will get labs and CT imaging, give pain meds and fluids, and upgrade acuity and send her to a pod area.  The patient appears stable so that the remainder of the MSE may be completed by another provider.  Mung Rinker Camprubi-Soms, PA-C 04/23/15 1226  Carmin Muskrat, MD 04/24/15 1600

## 2015-04-23 NOTE — ED Notes (Signed)
Per request of PA acuity increased to a 3 and new bed requested,

## 2015-06-07 ENCOUNTER — Emergency Department (HOSPITAL_COMMUNITY)
Admission: EM | Admit: 2015-06-07 | Discharge: 2015-06-07 | Disposition: A | Discharge status: Home / Self Care | Payer: Self-pay | Attending: Emergency Medicine | Admitting: Emergency Medicine

## 2015-06-07 ENCOUNTER — Encounter (HOSPITAL_COMMUNITY): Payer: Self-pay | Admitting: Vascular Surgery

## 2015-06-07 DIAGNOSIS — Z86018 Personal history of other benign neoplasm: Secondary | ICD-10-CM | POA: Insufficient documentation

## 2015-06-07 DIAGNOSIS — I10 Essential (primary) hypertension: Secondary | ICD-10-CM | POA: Insufficient documentation

## 2015-06-07 DIAGNOSIS — Z3202 Encounter for pregnancy test, result negative: Secondary | ICD-10-CM | POA: Insufficient documentation

## 2015-06-07 DIAGNOSIS — F419 Anxiety disorder, unspecified: Secondary | ICD-10-CM | POA: Insufficient documentation

## 2015-06-07 DIAGNOSIS — R350 Frequency of micturition: Secondary | ICD-10-CM

## 2015-06-07 DIAGNOSIS — R000 Tachycardia, unspecified: Secondary | ICD-10-CM | POA: Insufficient documentation

## 2015-06-07 DIAGNOSIS — N898 Other specified noninflammatory disorders of vagina: Secondary | ICD-10-CM | POA: Insufficient documentation

## 2015-06-07 DIAGNOSIS — D649 Anemia, unspecified: Secondary | ICD-10-CM | POA: Insufficient documentation

## 2015-06-07 LAB — URINALYSIS, ROUTINE W REFLEX MICROSCOPIC
Bilirubin Urine: NEGATIVE
Glucose, UA: NEGATIVE mg/dL
Hgb urine dipstick: NEGATIVE
Ketones, ur: NEGATIVE mg/dL
Leukocytes, UA: NEGATIVE
Nitrite: NEGATIVE
Protein, ur: NEGATIVE mg/dL
Specific Gravity, Urine: 1.002 — ABNORMAL LOW (ref 1.005–1.030)
Urobilinogen, UA: 0.2 mg/dL (ref 0.0–1.0)
pH: 6 (ref 5.0–8.0)

## 2015-06-07 LAB — POC URINE PREG, ED: Preg Test, Ur: NEGATIVE

## 2015-06-07 LAB — I-STAT CHEM 8, ED
BUN: 7 mg/dL (ref 6–20)
CALCIUM ION: 1.2 mmol/L (ref 1.12–1.23)
CREATININE: 0.8 mg/dL (ref 0.44–1.00)
Chloride: 103 mmol/L (ref 101–111)
GLUCOSE: 103 mg/dL — AB (ref 65–99)
HCT: 36 % (ref 36.0–46.0)
HEMOGLOBIN: 12.2 g/dL (ref 12.0–15.0)
POTASSIUM: 3.7 mmol/L (ref 3.5–5.1)
Sodium: 141 mmol/L (ref 135–145)
TCO2: 24 mmol/L (ref 0–100)

## 2015-06-07 LAB — WET PREP, GENITAL
CLUE CELLS WET PREP: NONE SEEN
TRICH WET PREP: NONE SEEN
Yeast Wet Prep HPF POC: NONE SEEN

## 2015-06-07 LAB — CBG MONITORING, ED: GLUCOSE-CAPILLARY: 89 mg/dL (ref 65–99)

## 2015-06-07 NOTE — ED Notes (Signed)
Pt reports to the ED for eval of urinary frequency and watery vaginal d/c. She reports the d/c has been intermittent for several months. She has been seen for the same the past and had a negative workup but it came back. Denies any vaginal bleeding, vaginal itching, or new sexual partners. Reports increased urination x last few days. Denies any dysuria. Has hx of uterine fibroid and states it is larger than normal. Pt A&Ox4, resp e/u, and skin warm and dry.

## 2015-06-07 NOTE — Discharge Instructions (Signed)
1. Medications: usual home medications 2. Treatment: rest, drink plenty of fluids,  3. Follow Up: Please followup with your primary doctor in 7 days for discussion of your diagnoses and further evaluation after today's visit; if you do not have a primary care doctor use the resource guide provided to find one; Please return to the ER for worsening symptoms

## 2015-06-07 NOTE — ED Provider Notes (Signed)
CSN: [id number]     Arrival date & time 06/07/15  1125 History   First MD Initiated Contact with Patient 06/07/15 1140     Chief Complaint  Patient presents with  . Vaginal Discharge  . Urinary Frequency     (Consider location/radiation/quality/duration/timing/severity/associated sxs/prior Treatment) Patient is a 44 y.o. female presenting with vaginal discharge and frequency. The history is provided by the patient and medical records. No language interpreter was used.  Vaginal Discharge Associated symptoms: abdominal pain (suprapubic)   Associated symptoms: no dysuria, no fever, no nausea and no vomiting   Urinary Frequency Associated symptoms include abdominal pain (suprapubic). Pertinent negatives include no chest pain, coughing, diaphoresis, fatigue, fever, headaches, nausea, rash or vomiting.     Casey Baker is a 44 y.o. female  with a hx of uterine fibroids, tachycardia, HTN, anemia presents to the Emergency Department complaining of gradual, persistent, progressively worsening vaginal discharge and urinary frequency. Associated symptoms include mild suprapubic abd pain.  Pt reports a hx of large fibroids that she thinks are protruding more than usual.  Pt reports urinary frequency and polyuria.  Patient reports that she notes this leakage when sitting in certain positions, moving from sitting to standing or bending over. She reports that the leakage has no color or odor to it.  She believes she has had an increase in vaginal discharge without odor.  Pt reports that the leakage is enough to soil her clothes. Pt denies fever, chills, headache, neck pain, chest pain, SOB, nausea, vomiting, diarrhea, weakness, dizziness, syncope, hematuria.  Pt reports she has been with 1 female partner for the last 7 years.  She reports her last STD was in her 51s.  Pt reports 4 weeks ago the condom they were using broke.  LMP: 2 weeks ago.     Past Medical History  Diagnosis Date  . Hypertension   .  Uterine fibroid   . Anemia    History reviewed. No pertinent past surgical history. No family history on file. Social History  Substance Use Topics  . Smoking status: Never Smoker   . Smokeless tobacco: Never Used  . Alcohol Use: No   OB History    No data available     Review of Systems  Constitutional: Negative for fever, diaphoresis, appetite change, fatigue and unexpected weight change.  HENT: Negative for mouth sores.   Eyes: Negative for visual disturbance.  Respiratory: Negative for cough, chest tightness, shortness of breath and wheezing.   Cardiovascular: Negative for chest pain.  Gastrointestinal: Positive for abdominal pain (suprapubic). Negative for nausea, vomiting, diarrhea and constipation.  Endocrine: Positive for polyuria. Negative for polydipsia and polyphagia.  Genitourinary: Positive for frequency and vaginal discharge. Negative for dysuria, urgency and hematuria.  Musculoskeletal: Negative for back pain and neck stiffness.  Skin: Negative for rash.  Allergic/Immunologic: Negative for immunocompromised state.  Neurological: Negative for syncope, light-headedness and headaches.  Hematological: Does not bruise/bleed easily.  Psychiatric/Behavioral: Negative for sleep disturbance. The patient is not nervous/anxious.       Allergies  Review of patient's allergies indicates no known allergies.  Home Medications   Prior to Admission medications   Medication Sig Start Date End Date Taking? Authorizing Provider  cyclobenzaprine (FLEXERIL) 10 MG tablet Take 1 tablet (10 mg total) by mouth 3 (three) times daily as needed for muscle spasms. 09/30/14  Yes Mercedes Camprubi-Soms, PA-C  ferrous sulfate (FERROUSUL) 325 (65 FE) MG tablet Take 1 tablet (325 mg total) by mouth at bedtime. 09/29/14  Yes [name], MD  naproxen (NAPROSYN) 375 MG tablet Take 375 mg by mouth 2 (two) times daily with a meal. 03/20/15  Yes Historical Provider, MD   BP 155/93 mmHg  Pulse 127   Temp(Src) 97.9 F (36.6 C) (Oral)  Resp 16  SpO2 100%  LMP 05/28/2015 (Approximate) Physical Exam  Constitutional: She appears well-developed and well-nourished. No distress.  Awake, alert, nontoxic appearance  HENT:  Head: Normocephalic and atraumatic.  Mouth/Throat: Oropharynx is clear and moist. No oropharyngeal exudate.  Eyes: Conjunctivae are normal. No scleral icterus.  Neck: Normal range of motion. Neck supple.  Cardiovascular: Regular rhythm and intact distal pulses.  Tachycardia present.  Exam reveals gallop.   Pulses:      Radial pulses are 2+ on the right side, and 2+ on the left side.       Dorsalis pedis pulses are 2+ on the right side, and 2+ on the left side.  Pulmonary/Chest: Effort normal and breath sounds normal. No respiratory distress. She has no wheezes.  Equal chest expansion  Abdominal: Soft. Bowel sounds are normal. She exhibits mass. There is no tenderness. There is no rebound and no guarding. Hernia confirmed negative in the right inguinal area and confirmed negative in the left inguinal area.  Uterus and uterine fibroids are palpable in the abdomen greater on the right than the left; patient reports they are nontender  Genitourinary: No labial fusion. There is no rash, tenderness or lesion on the right labia. There is no rash, tenderness or lesion on the left labia. Uterus is enlarged. Uterus is not deviated, not fixed and not tender. Cervix exhibits no motion tenderness, no discharge and no friability. Right adnexum displays no mass, no tenderness and no fullness. Left adnexum displays no mass, no tenderness and no fullness. No erythema, tenderness or bleeding in the vagina. No foreign body around the vagina. No signs of injury around the vagina. Vaginal discharge ( Minimal, white, nonodorous) found.  Uterus is enlarged but not tender No cervical motion tenderness or friable cervix. Vaginal discharge does not appear purulent  Musculoskeletal: Normal range of  motion. She exhibits no edema.  Lymphadenopathy:       Right: No inguinal adenopathy present.       Left: No inguinal adenopathy present.  Neurological: She is alert.  Speech is clear and goal oriented Moves extremities without ataxia  Skin: Skin is warm and dry. She is not diaphoretic. No erythema.  Psychiatric: She has a normal mood and affect.  Nursing note and vitals reviewed.   ED Course  Procedures (including critical care time) Labs Review Labs Reviewed  WET PREP, GENITAL - Abnormal; Notable for the following:    WBC, Wet Prep HPF POC FEW (*)    All other components within normal limits  URINALYSIS, ROUTINE W REFLEX MICROSCOPIC (NOT AT [facility]) - Abnormal; Notable for the following:    Specific Gravity, Urine 1.002 (*)    All other components within normal limits  I-STAT CHEM 8, ED - Abnormal; Notable for the following:    Glucose, Bld 103 (*)    All other components within normal limits  RPR  HIV ANTIBODY (ROUTINE TESTING)  POC URINE PREG, ED  CBG MONITORING, ED  GC/CHLAMYDIA PROBE AMP ([place]) NOT AT [facility]    Imaging Review No results found. I have personally reviewed and evaluated these images and lab results as part of my medical decision-making.   EKG Interpretation None      MDM  Final diagnoses:  Urinary frequency  History of uterine fibroid  Vaginal discharge  Sinus tachycardia  Anxiety   Casey Baker presents with complaints of vaginal discharge and urinary frequency, to [place] for several months. She reports she was evaluated for this prior but is much more nervous about it since her boyfriend condom broke several weeks ago.  Patient's abdomen is soft and nontender with palpable uterus and very large fibroids.  Record review shows the patient had CT scan in January 2015 which showed hydronephrosis due to her enlarging fibroids. I recommended a CT scan or ultrasound today to reevaluate her fibroids and kidneys. She declines at this time wishing  to perform this on an outpatient setting.    2 PM No increased serum creatinine, pregnancy test negative, CBG 89 and urinalysis without ketones, glucose or evidence of urinary tract infection. No evidence the patient's polyuria is associated with diabetes. Wet prep pending; however no foul-smelling discharge, adnexal or cervical motion tenderness to suggest pelvic infection.    2:29 PM Wet prep with only few white blood cells. Patient is low risk for STD and I recommend that she wait for cultures to pursue treatment. Based on patient's history and physical I think is most likely that she is leaking urine due to her very large fibroids. Patient has scheduled an appointment [facility] in one week for further evaluation and for referral to OB/GYN for discussion of surgical removal of her fibroids. Her abdomen is soft and nontender. Her fibroids are palpable on exam.    Patient has been tachycardic throughout her time here in the emergency department. She reports this is normal for her usually to the 150s. Patient reports this is directly related to her anxiety however she wishes for no anxiety medication today here in the emergency department because she is driving. She denies chest pain, shortness of breath, dyspnea on exertion, long car trips or leg swelling. Doubt DVT or PE at this time.  Patient with significant tachycardia at previous encounters as well.  BP 155/93 mmHg  Pulse 127  Temp(Src) 97.9 F (36.6 C) (Oral)  Resp 16  SpO2 100%  LMP 05/28/2015 (Approximate)    Abigail Butts, PA-C 06/07/15 [place], MD 06/12/15 1351

## 2015-06-08 ENCOUNTER — Telehealth (HOSPITAL_BASED_OUTPATIENT_CLINIC_OR_DEPARTMENT_OTHER): Payer: Self-pay | Admitting: Emergency Medicine

## 2015-06-08 LAB — RPR: RPR: NONREACTIVE

## 2015-06-08 LAB — HIV ANTIBODY (ROUTINE TESTING W REFLEX): HIV SCREEN 4TH GENERATION: NONREACTIVE

## 2015-06-09 ENCOUNTER — Telehealth (HOSPITAL_COMMUNITY): Payer: Self-pay

## 2015-06-09 NOTE — Telephone Encounter (Signed)
Pt call for gon./chlam. Results.  ID verified x 2.  Informed pt results are not back at this time.

## 2015-06-10 LAB — GC/CHLAMYDIA PROBE AMP (~~LOC~~) NOT AT ARMC
Chlamydia: NEGATIVE
Neisseria Gonorrhea: NEGATIVE

## 2015-06-14 ENCOUNTER — Ambulatory Visit: Payer: Self-pay

## 2017-10-19 DIAGNOSIS — C50919 Malignant neoplasm of unspecified site of unspecified female breast: Secondary | ICD-10-CM

## 2017-10-19 HISTORY — DX: Malignant neoplasm of unspecified site of unspecified female breast: C50.919

## 2017-12-08 ENCOUNTER — Other Ambulatory Visit: Payer: Self-pay

## 2017-12-08 ENCOUNTER — Emergency Department (HOSPITAL_COMMUNITY)
Admission: EM | Admit: 2017-12-08 | Discharge: 2017-12-08 | Disposition: A | Discharge status: Home / Self Care | Payer: Self-pay | Attending: Emergency Medicine | Admitting: Emergency Medicine

## 2017-12-08 ENCOUNTER — Encounter (HOSPITAL_COMMUNITY): Payer: Self-pay | Admitting: *Deleted

## 2017-12-08 DIAGNOSIS — Y998 Other external cause status: Secondary | ICD-10-CM | POA: Insufficient documentation

## 2017-12-08 DIAGNOSIS — I10 Essential (primary) hypertension: Secondary | ICD-10-CM | POA: Insufficient documentation

## 2017-12-08 DIAGNOSIS — Y939 Activity, unspecified: Secondary | ICD-10-CM | POA: Insufficient documentation

## 2017-12-08 DIAGNOSIS — Z23 Encounter for immunization: Secondary | ICD-10-CM | POA: Insufficient documentation

## 2017-12-08 DIAGNOSIS — S299XXA Unspecified injury of thorax, initial encounter: Secondary | ICD-10-CM

## 2017-12-08 DIAGNOSIS — S21032A Puncture wound without foreign body of left breast, initial encounter: Secondary | ICD-10-CM | POA: Insufficient documentation

## 2017-12-08 DIAGNOSIS — W272XXA Contact with scissors, initial encounter: Secondary | ICD-10-CM | POA: Insufficient documentation

## 2017-12-08 DIAGNOSIS — Y929 Unspecified place or not applicable: Secondary | ICD-10-CM | POA: Insufficient documentation

## 2017-12-08 DIAGNOSIS — D509 Iron deficiency anemia, unspecified: Secondary | ICD-10-CM | POA: Insufficient documentation

## 2017-12-08 LAB — BASIC METABOLIC PANEL
Anion gap: 13 (ref 5–15)
BUN: 6 mg/dL (ref 6–20)
CALCIUM: 9.1 mg/dL (ref 8.9–10.3)
CHLORIDE: 104 mmol/L (ref 101–111)
CO2: 22 mmol/L (ref 22–32)
CREATININE: 0.87 mg/dL (ref 0.44–1.00)
GFR calc Af Amer: >60 mL/min (ref >60)
GFR calc non Af Amer: >60 mL/min (ref >60)
GLUCOSE: 112 mg/dL — AB (ref 65–99)
Potassium: 4.3 mmol/L (ref 3.5–5.1)
Sodium: 139 mmol/L (ref 135–145)

## 2017-12-08 LAB — I-STAT BETA HCG BLOOD, ED (MC, WL, AP ONLY): I-stat hCG, quantitative: <5 m[IU]/mL (ref <5)

## 2017-12-08 LAB — CBC
HCT: 31.8 % — ABNORMAL LOW (ref 36.0–46.0)
HEMOGLOBIN: 8.9 g/dL — AB (ref 12.0–15.0)
MCH: 17.7 pg — AB (ref 26.0–34.0)
MCHC: 28 g/dL — AB (ref 30.0–36.0)
MCV: 63.2 fL — AB (ref 78.0–100.0)
Platelets: 406 10*3/uL — ABNORMAL HIGH (ref 150–400)
RBC: 5.03 MIL/uL (ref 3.87–5.11)
RDW: 21.1 % — ABNORMAL HIGH (ref 11.5–15.5)
WBC: 5 10*3/uL (ref 4.0–10.5)

## 2017-12-08 MED ORDER — TETANUS-DIPHTH-ACELL PERTUSSIS 5-2.5-18.5 LF-MCG/0.5 IM SUSP
0.5000 mL | Freq: Once | INTRAMUSCULAR | Status: AC
  Administered 2017-12-08: 0.5 mL via INTRAMUSCULAR
  Filled 2017-12-08: qty 0.5

## 2017-12-08 MED ORDER — FERROUS SULFATE 325 (65 FE) MG PO TABS: 325.0000 mg | ORAL_TABLET | Freq: Every day | ORAL | 0 refills | Status: DC

## 2017-12-08 NOTE — Discharge Instructions (Signed)
Keep the area clean and dry. DO NOT apply pressure or pu clear-pink drainage is normal, pus is not.  Return to the emergency department if there are anysh on the area.  Signs of an infection such as fever, redness, swelling, or abnormal drainage.  You may apply antibiotic ointment twice daily if you wish.  Take your Iron supplements as prescribed.  Follow-up with OB/GYN and primary care for reevaluation of your symptoms.  Return to the department if any concerning signs or symptoms develop.

## 2017-12-08 NOTE — ED Triage Notes (Addendum)
Pt reports cutting left nipple accidentally with scissors 6 weeks ago and still has bleeding/oozing from nipple. Pt has hx of anemia and reports fatigue and heavy vaginal bleeding so requests to have hgb and iron level checked. Tachy at triage but patient states that is her baseline.

## 2017-12-08 NOTE — ED Notes (Signed)
D/C reviewed with patient. No further questions

## 2017-12-08 NOTE — ED Notes (Signed)
ED Provider at bedside. 
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
## 2018-01-12 ENCOUNTER — Ambulatory Visit: Payer: Self-pay | Attending: Internal Medicine | Admitting: Internal Medicine

## 2018-01-12 ENCOUNTER — Encounter: Payer: Self-pay | Admitting: Internal Medicine

## 2018-01-12 ENCOUNTER — Other Ambulatory Visit: Payer: Self-pay

## 2018-01-12 VITALS — BP 150/93 | HR 124 | Temp 97.8°F | Resp 16 | Wt 177.0 lb

## 2018-01-12 DIAGNOSIS — S21032D Puncture wound without foreign body of left breast, subsequent encounter: Secondary | ICD-10-CM | POA: Insufficient documentation

## 2018-01-12 DIAGNOSIS — D508 Other iron deficiency anemias: Secondary | ICD-10-CM

## 2018-01-12 DIAGNOSIS — I10 Essential (primary) hypertension: Secondary | ICD-10-CM | POA: Insufficient documentation

## 2018-01-12 DIAGNOSIS — F418 Other specified anxiety disorders: Secondary | ICD-10-CM | POA: Insufficient documentation

## 2018-01-12 DIAGNOSIS — S21002D Unspecified open wound of left breast, subsequent encounter: Secondary | ICD-10-CM | POA: Insufficient documentation

## 2018-01-12 DIAGNOSIS — S21002A Unspecified open wound of left breast, initial encounter: Secondary | ICD-10-CM

## 2018-01-12 DIAGNOSIS — F410 Panic disorder [episodic paroxysmal anxiety] without agoraphobia: Secondary | ICD-10-CM | POA: Insufficient documentation

## 2018-01-12 DIAGNOSIS — D649 Anemia, unspecified: Secondary | ICD-10-CM | POA: Insufficient documentation

## 2018-01-12 MED ORDER — FERROUS SULFATE 325 (65 FE) MG PO TABS: 325.0000 mg | ORAL_TABLET | Freq: Every day | ORAL | 3 refills | Status: DC

## 2018-01-12 MED ORDER — PAROXETINE HCL 10 MG PO TABS: 10.0000 mg | ORAL_TABLET | Freq: Every day | ORAL | 3 refills | Status: DC

## 2018-01-12 MED FILL — FERROUS SULFATE 325 MG TAB: 325 (65 FE) | 30 days supply | Qty: 30 | Fill #0

## 2018-01-12 MED FILL — PARoxetine HCL 10 MG TABS: 10 | 30 days supply | Qty: 30 | Fill #0

## 2018-01-12 NOTE — Progress Notes (Signed)
Subjective:    Patient ID: Casey Baker, female    DOB: 1971/03/31, 47 y.o.   MRN: [id number]  HPI  Patient here for ED follow up - seen 12/08/17 for drainage from unintentional puncture wound to L breast with scissors and general labs for history of anemia. Reports continued bleeding and blood tinged drainage from nipple wound with manipulation or pressure to breast. Never had mammo and wants breasts checked. Also reports her fast heart and BP are aggravated by worry. Prior hx anxiety and depression requiring rx but not on any rx for 20 years since no PCP.  Past Medical History:  Diagnosis Date  . Anemia   . Anxiety   . Hypertension   . Uterine fibroid     Review of Systems  Constitutional: Positive for fatigue. Negative for fever.  Cardiovascular: Positive for palpitations (chronic, worse when anxious). Negative for chest pain.  Neurological: Positive for dizziness (occassional).  Psychiatric/Behavioral: Positive for decreased concentration, dysphoric mood and sleep disturbance. Negative for self-injury and suicidal ideas. The patient is nervous/anxious.        Objective:    Physical Exam  Constitutional: She appears well-developed and well-nourished. No distress.  Cardiovascular: Regular rhythm and normal heart sounds.  No murmur heard. tachy  Pulmonary/Chest: Effort normal and breath sounds normal. No respiratory distress.  Musculoskeletal: She exhibits no edema.  Skin: No rash noted.  L breast without mass or redness. No obvious "wound" to nipple on visual exam. Upon specific pattern of manipulation by patient, serous fluid alternating with dark blood expressed from nipple. Continued spont drainage for 10 sec following same, then stop.  Psychiatric:  Mildly anxious and dysphoric but appreciative of conversation and validation. smiles    BP (!) 150/93 (BP Location: Right Arm, Patient Position: Sitting, Cuff Size: Large)   Pulse (!) 124   Temp 97.8 F (36.6 C) (Oral)    Resp 16   Wt 177 lb (80.3 kg)   LMP 11/14/2017   SpO2 97%   BMI 27.72 kg/m  Wt Readings from Last 3 Encounters:  01/12/18 177 lb (80.3 kg)  10/09/14 170 lb (77.1 kg)  09/29/14 172 lb (78 kg)    Lab Results  Component Value Date   WBC 5.0 12/08/2017   HGB 8.9 (L) 12/08/2017   HCT 31.8 (L) 12/08/2017   PLT 406 (H) 12/08/2017   GLUCOSE 112 (H) 12/08/2017   NA 139 12/08/2017   K 4.3 12/08/2017   CL 104 12/08/2017   CREATININE 0.87 12/08/2017   BUN 6 12/08/2017   CO2 22 12/08/2017   TSH 1.290 09/29/2014    No results found.     Assessment & Plan:   Problem List Items Addressed This Visit    Anemia    Encouraged to resume daily Fe - refill rx to this pharmacy.      Relevant Medications   ferrous sulfate 325 (65 FE) MG tablet   Anxiety with depression    Long history of same - remote rx therapy by PCP when pt in her 70s. Overlap with depression symptoms but denies SI?HI today. Reports increase in tachycardia with anxiety attacks. Agreeable to starting SSRI therapy - generic low dose paxil selected for daily use - we reviewed potential risk/benefit and possible side effects - pt understands and agrees to same  follow up here 4-6 weeks on meds for symptoms management and titration of rx as needed      Relevant Medications   PARoxetine (PAXIL) 10 MG tablet  Breast wound, left, subsequent encounter - Primary    Ongoing serous and blood tingles drainage from L nipple following accidental puncture by craft scissors 2 mo ago. On exam, no redness, mass or symptoms infection. Pt admits her anxiety and worry keeps her "manipulating" breast daily to express the fluid collected. Will refer for mammo (none ever done) to also help reassure and evaluate, and, no other abnormalities detected on exam.      Relevant Orders   MM DIGITAL SCREENING BILATERAL   Hypertension    Pt reports this elevated BP is ONLY situational, anxiety induced from MD and hospital contact.  Denies FH  hypertension. Manual recheck SBP 138 Will treat anxiety/depression with SSRI as above and recheck BP in 4-6 weeks. Consider low dose diuretic if continued elevation next OV          Gwendolyn Grant, MD

## 2018-01-12 NOTE — Assessment & Plan Note (Signed)
Long history of same - remote rx therapy by PCP when pt in her 50s. Overlap with depression symptoms but denies SI?HI today. Reports increase in tachycardia with anxiety attacks. Agreeable to starting SSRI therapy - generic low dose paxil selected for daily use - we reviewed potential risk/benefit and possible side effects - pt understands and agrees to same  follow up here 4-6 weeks on meds for symptoms management and titration of rx as needed

## 2018-01-12 NOTE — Patient Instructions (Addendum)
It was good to see you today.  We have reviewed your prior records including labs today  Medications reviewed and updated  continue daily iron for your anemia and begin daily generic PAXIL for your anxiety and depression symptoms  Your prescription(s) have been submitted to your pharmacy. Please take as directed and contact our office if you believe you are having problem(s) with the medication(s).   we'll make referral for mammogram to evaluate your breasts as discussed. Our office will contact you regarding appointment(s) once made. Mammogram ordered today. Your results will be released to [facility] (or called to you) after review, usually within 72hours after test completion. If any changes need to be made, you will be notified at that same time.   Please schedule followup in 4-6 weeks on anxiety/depression symptoms and medications with recheck of blood pressure - please call sooner if problems.   Living With Anxiety After being diagnosed with an anxiety disorder, you may be relieved to know why you have felt or behaved a certain way. It is natural to also feel overwhelmed about the treatment ahead and what it will mean for your life. With care and support, you can manage this condition and recover from it. How to cope with anxiety Dealing with stress Stress is your body's reaction to life changes and events, both good and bad. Stress can last just a few hours or it can be ongoing. Stress can play a major role in anxiety, so it is important to learn both how to cope with stress and how to think about it differently. Talk with your health care provider or a counselor to learn more about stress reduction. He or she may suggest some stress reduction techniques, such as:  Music therapy. This can include creating or listening to music that you enjoy and that inspires you.  Mindfulness-based meditation. This involves being aware of your normal breaths, rather than trying to control your  breathing. It can be done while sitting or walking.  Centering prayer. This is a kind of meditation that involves focusing on a word, phrase, or sacred image that is meaningful to you and that brings you peace.  Deep breathing. To do this, expand your stomach and inhale slowly through your nose. Hold your breath for 3-5 seconds. Then exhale slowly, allowing your stomach muscles to relax.  Self-talk. This is a skill where you identify thought patterns that lead to anxiety reactions and correct those thoughts.  Muscle relaxation. This involves tensing muscles then relaxing them.  Choose a stress reduction technique that fits your lifestyle and personality. Stress reduction techniques take time and practice. Set aside 5-15 minutes a day to do them. Therapists can offer training in these techniques. The training may be covered by some insurance plans. Other things you can do to manage stress include:  Keeping a stress diary. This can help you learn what triggers your stress and ways to control your response.  Thinking about how you respond to certain situations. You may not be able to control everything, but you can control your reaction.  Making time for activities that help you relax, and not feeling guilty about spending your time in this way.  Therapy combined with coping and stress-reduction skills provides the best chance for successful treatment. Medicines Medicines can help ease symptoms. Medicines for anxiety include:  Anti-anxiety drugs.  Antidepressants.  Beta-blockers.  Medicines may be used as the main treatment for anxiety disorder, along with therapy, or if other treatments are not working.  Medicines should be prescribed by a health care provider. Relationships Relationships can play a big part in helping you recover. Try to spend more time connecting with trusted friends and family members. Consider going to couples counseling, taking family education classes, or going to  family therapy. Therapy can help you and others better understand the condition. How to recognize changes in your condition Everyone has a different response to treatment for anxiety. Recovery from anxiety happens when symptoms decrease and stop interfering with your daily activities at home or work. This may mean that you will start to:  Have better concentration and focus.  Sleep better.  Be less irritable.  Have more energy.  Have improved memory.  It is important to recognize when your condition is getting worse. Contact your health care provider if your symptoms interfere with home or work and you do not feel like your condition is improving. Where to find help and support: You can get help and support from these sources:  Self-help groups.  Online and [employer].  A trusted spiritual leader.  Couples counseling.  Family education classes.  Family therapy.  Follow these instructions at home:  Eat a healthy diet that includes plenty of vegetables, fruits, whole grains, low-fat dairy products, and lean protein. Do not eat a lot of foods that are high in solid fats, added sugars, or salt.  Exercise. Most adults should do the following: ? Exercise for at least 150 minutes each week. The exercise should increase your heart rate and make you sweat (moderate-intensity exercise). ? Strengthening exercises at least twice a week.  Cut down on caffeine, tobacco, alcohol, and other potentially harmful substances.  Get the right amount and quality of sleep. Most adults need 7-9 hours of sleep each night.  Make choices that simplify your life.  Take over-the-counter and prescription medicines only as told by your health care provider.  Avoid caffeine, alcohol, and certain over-the-counter cold medicines. These may make you feel worse. Ask your pharmacist which medicines to avoid.  Keep all follow-up visits as told by your health care provider. This is  important. Questions to ask your health care provider  Would I benefit from therapy?  How often should I follow up with a health care provider?  How long do I need to take medicine?  Are there any long-term side effects of my medicine?  Are there any alternatives to taking medicine? Contact a health care provider if:  You have a hard time staying focused or finishing daily tasks.  You spend many hours a day feeling worried about everyday life.  You become exhausted by worry.  You start to have headaches, feel tense, or have nausea.  You urinate more than normal.  You have diarrhea. Get help right away if:  You have a racing heart and shortness of breath.  You have thoughts of hurting yourself or others. If you ever feel like you may hurt yourself or others, or have thoughts about taking your own life, get help right away. You can go to your nearest emergency department or call:  Your local emergency services (911 in the [place]  A suicide crisis helpline, such as the [place] at 310-560-1993. This is open 24-hours a day.  Summary  Taking steps to deal with stress can help calm you.  Medicines cannot cure anxiety disorders, but they can help ease symptoms.  Family, friends, and partners can play a big part in helping you recover from an anxiety disorder.  This information is not intended to replace advice given to you by your health care provider. Make sure you discuss any questions you have with your health care provider. Document Released: 09/29/2016 Document Revised: 09/29/2016 Document Reviewed: 09/29/2016 Elsevier Interactive Patient Education  [employer].

## 2018-01-12 NOTE — Assessment & Plan Note (Signed)
Pt reports this elevated BP is ONLY situational, anxiety induced from MD and hospital contact.  Denies FH hypertension. Manual recheck SBP 138 Will treat anxiety/depression with SSRI as above and recheck BP in 4-6 weeks. Consider low dose diuretic if continued elevation next OV

## 2018-01-12 NOTE — Assessment & Plan Note (Signed)
Ongoing serous and blood tingles drainage from L nipple following accidental puncture by craft scissors 2 mo ago. On exam, no redness, mass or symptoms infection. Pt admits her anxiety and worry keeps her "manipulating" breast daily to express the fluid collected. Will refer for mammo (none ever done) to also help reassure and evaluate, and, no other abnormalities detected on exam.

## 2018-01-12 NOTE — Assessment & Plan Note (Signed)
Encouraged to resume daily Fe - refill rx to this pharmacy.

## 2018-01-12 NOTE — Progress Notes (Signed)
F/u left breast injury Still has drainage, bleeding Denies pain, redness, or inflammation

## 2018-01-17 ENCOUNTER — Other Ambulatory Visit (HOSPITAL_COMMUNITY): Payer: Self-pay | Admitting: *Deleted

## 2018-01-17 DIAGNOSIS — N6452 Nipple discharge: Secondary | ICD-10-CM

## 2018-01-24 ENCOUNTER — Ambulatory Visit: Payer: Self-pay

## 2018-01-27 ENCOUNTER — Other Ambulatory Visit (HOSPITAL_COMMUNITY): Payer: Self-pay | Admitting: Obstetrics and Gynecology

## 2018-01-27 ENCOUNTER — Ambulatory Visit (HOSPITAL_COMMUNITY)
Admission: RE | Admit: 2018-01-27 | Discharge: 2018-01-27 | Disposition: A | Discharge status: Home / Self Care | Payer: Self-pay | Source: Ambulatory Visit | Attending: Obstetrics and Gynecology | Admitting: Obstetrics and Gynecology

## 2018-01-27 ENCOUNTER — Ambulatory Visit
Admission: RE | Admit: 2018-01-27 | Discharge: 2018-01-27 | Disposition: A | Discharge status: Home / Self Care | Payer: No Typology Code available for payment source | Source: Ambulatory Visit | Attending: Obstetrics and Gynecology | Admitting: Obstetrics and Gynecology

## 2018-01-27 ENCOUNTER — Ambulatory Visit (HOSPITAL_COMMUNITY): Payer: Self-pay

## 2018-01-27 ENCOUNTER — Encounter (HOSPITAL_COMMUNITY): Payer: Self-pay

## 2018-01-27 VITALS — BP 152/94 | Ht 67.0 in

## 2018-01-27 DIAGNOSIS — N6452 Nipple discharge: Secondary | ICD-10-CM

## 2018-01-27 DIAGNOSIS — Z01419 Encounter for gynecological examination (general) (routine) without abnormal findings: Secondary | ICD-10-CM

## 2018-01-27 DIAGNOSIS — N6320 Unspecified lump in the left breast, unspecified quadrant: Secondary | ICD-10-CM

## 2018-01-27 DIAGNOSIS — N6310 Unspecified lump in the right breast, unspecified quadrant: Secondary | ICD-10-CM

## 2018-01-27 DIAGNOSIS — N6325 Unspecified lump in the left breast, overlapping quadrants: Secondary | ICD-10-CM

## 2018-01-27 DIAGNOSIS — N898 Other specified noninflammatory disorders of vagina: Secondary | ICD-10-CM

## 2018-01-27 NOTE — Progress Notes (Signed)
Complaints of left breast spontaneous bloody nipple discharge since January 2019 and possible left breast lump.  Pap Smear: Pap smear completed today. Last Pap smear was around 20 years ago and normal per patient. Per patient has no history of an abnormal Pap smear. No Pap smear results are in Epic.  Physical exam: Breasts Breasts symmetrical. No skin abnormalities bilateral breasts. No nipple retraction bilateral breasts. No nipple discharge right breast. Bloody discharge expressed on exam from left breast. Sample of discharge sent to Cytology for evaluation. No lymphadenopathy. No lumps palpated right breast. Palpated a lump within the left breast at 3 o'clock 4 cm from the nipple No complaints of pain or tenderness on exam. Referred patient to the [facility] for a bilateral diagnostic mammogram and left breast ultrasound. Appointment scheduled for Thursday, January 27, 2018 at 0920.        Pelvic/Bimanual   Ext Genitalia No lesions, no swelling and no discharge observed on external genitalia.         Vagina Vagina pink and normal texture. No lesions and yellowish colored frothy discharge observed in vagina. Wet prep completed.         Cervix Cervix is present. Cervix pink and of normal texture. Yellowish colored frothy discharge observed on cervix.    Uterus Uterus is present and palpable. Uterus in normal position and enlarged. Patient has a history of fibroids and AUB. Will refer patient to the [facility] for follow-up.   Adnexae Bilateral ovaries present and palpable. No tenderness on palpation.         Rectovaginal No rectal exam completed today since patient had no rectal complaints. No skin abnormalities observed on exam.    Smoking History: Patient has never smoked.  Patient Navigation: Patient education provided. Access to services provided for patient through [facility] program.   Breast and Cervical Cancer Risk  Assessment: Patient has no family history of breast cancer, known genetic mutations, or radiation treatment to the chest before age 27. Patient has no history of cervical dysplasia, immunocompromised, or DES exposure in-utero.

## 2018-01-27 NOTE — Patient Instructions (Signed)
Explained breast self awareness with Feliberto Harts. Let patient know [facility] will cover Pap smears and HPV typing every 5 years unless has a history of abnormal Pap smears. Referred patient to the [facility] for a bilateral diagnostic mammogram and left breast ultrasound. Appointment scheduled for Thursday, January 27, 2018 at 0920. Let patient know will follow up with her within the next week with results of Pap smear, wet prep, and breast discharge results by phone. Told patient that I will refer her to the [facility] to follow-up for her fibroids and let her know [facility] will not cover. Patient stated she has completed the financial assistance paperwork. Feliberto Harts verbalized understanding.  Lora Chavers, Arvil [place], RN 8:26 AM

## 2018-01-28 ENCOUNTER — Encounter (HOSPITAL_COMMUNITY): Payer: Self-pay | Admitting: *Deleted

## 2018-01-28 LAB — CYTOLOGY - PAP
Adequacy: ABSENT
BACTERIAL VAGINITIS: POSITIVE — AB
CANDIDA VAGINITIS: NEGATIVE
DIAGNOSIS: NEGATIVE
HPV: NOT DETECTED
Trichomonas: NEGATIVE

## 2018-01-31 ENCOUNTER — Ambulatory Visit
Admission: RE | Admit: 2018-01-31 | Discharge: 2018-01-31 | Disposition: A | Discharge status: Home / Self Care | Payer: Self-pay | Source: Ambulatory Visit | Attending: Obstetrics and Gynecology | Admitting: Obstetrics and Gynecology

## 2018-01-31 ENCOUNTER — Other Ambulatory Visit (HOSPITAL_COMMUNITY): Payer: Self-pay | Admitting: Obstetrics and Gynecology

## 2018-01-31 DIAGNOSIS — N6320 Unspecified lump in the left breast, unspecified quadrant: Secondary | ICD-10-CM

## 2018-02-01 ENCOUNTER — Telehealth: Payer: Self-pay | Admitting: General Practice

## 2018-02-01 ENCOUNTER — Telehealth: Payer: Self-pay | Admitting: Hematology

## 2018-02-01 NOTE — Telephone Encounter (Signed)
Called and notified patient of appointment with Dr. Kennon Rounds on 03/01/17 at 8:55am.

## 2018-02-01 NOTE — Telephone Encounter (Signed)
Spoke to patient to confirm afternoon [facility] appointment for 4/24, packet will be mailed to patient

## 2018-02-02 ENCOUNTER — Telehealth (HOSPITAL_COMMUNITY): Payer: Self-pay | Admitting: *Deleted

## 2018-02-02 ENCOUNTER — Other Ambulatory Visit (HOSPITAL_COMMUNITY): Payer: Self-pay | Admitting: *Deleted

## 2018-02-02 MED ORDER — METRONIDAZOLE 500 MG PO TABS: 500.0000 mg | ORAL_TABLET | Freq: Two times a day (BID) | ORAL | 0 refills | Status: DC

## 2018-02-02 NOTE — Telephone Encounter (Signed)
Telephoned patient at home number and advised patient of negative pap smear results. HPV was negative. Next pap smear due in five years. Advised patient pap smear did show bacterial vaginosis. Medication was called into [employer]. Advised patient to finish all mediation and no alcohol while taking medication. Patient to fill out [facility] Medicaid on Wednesday April 24 11:30. Patient voiced understanding.

## 2018-02-03 ENCOUNTER — Encounter: Payer: Self-pay | Admitting: *Deleted

## 2018-02-03 DIAGNOSIS — D0512 Intraductal carcinoma in situ of left breast: Secondary | ICD-10-CM

## 2018-02-03 DIAGNOSIS — C50412 Malignant neoplasm of upper-outer quadrant of left female breast: Secondary | ICD-10-CM | POA: Insufficient documentation

## 2018-02-03 HISTORY — DX: Intraductal carcinoma in situ of left breast: D05.12

## 2018-02-07 ENCOUNTER — Telehealth (HOSPITAL_COMMUNITY): Payer: Self-pay | Admitting: *Deleted

## 2018-02-07 NOTE — Telephone Encounter (Signed)
Called patient to let her know the results of her breast discharge. Answered patients questions. Patient verbalized understanding. Patient is scheduled to complete her [facility] Medicaid paperwork with Sabrina on Wednesday at 1130.

## 2018-02-08 ENCOUNTER — Other Ambulatory Visit: Payer: Self-pay | Admitting: Hematology

## 2018-02-08 NOTE — Progress Notes (Signed)
[place]  [phone] [phone]  Clinic New Consult Note   Patient Care Team: Patient, [name] - General (General Practice) 02/09/2018  CHIEF COMPLAINTS/PURPOSE OF CONSULTATION:  Left Breast DCIS  Oncology History   Cancer Staging Ductal carcinoma in situ (DCIS) of left breast Staging form: Breast, AJCC 8th Edition - Clinical stage from 02/09/2018: Stage 0 (cTis (DCIS), cN0, cM0, ER+, PR+) - Unsigned       Ductal carcinoma in situ (DCIS) of left breast   01/27/2018 Mammogram    IMPRESSION: Suspicious bloody left nipple discharge with palpable masses in the 2 o'clock axis of the left breast. Findings are suspicious for Malignancy. ADDENDUM: Magnification views of the retroareolar right breast were performed to evaluate calcifications seen on the patient's baseline mammogram. There are loosely grouped and scattered calcifications in the outer right breast, superior to and directly posterior to the nipple. These calcifications are rounded and smudgy in the CC projection, and many of them demonstrate layering on the 90 degree lateral magnification view, consistent with benign milk of calcium. No suspicious microcalcifications are seen in the right breast.      01/31/2018 Initial Biopsy    Diagnosis 01/31/18 1. 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 - NEGATIVE FOR INVASIVE CARCINOMA. 2. 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 - NEGATIVE FOR INVASIVE CARCINOMA.      01/31/2018 Receptors her2    Prognostic indicators significant for: ER, 100% positive and PR, 100% positive, both with strong staining intensity.       02/03/2018 Initial Diagnosis    Ductal carcinoma in situ (DCIS) of left breast       HISTORY OF PRESENTING  ILLNESS:  Casey Baker 47 y.o. 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 The patient was referred by her PCP Dr. Elly Modena. The patient presents to the clinic today accompanied by her family member.  Prior to pt's abnormal mammogram, she felt the mass herself and noticed some bloody nipple discharge in January 2019. She states that was her first mammogram. Since the biopsy, she reports the nipple discharge has stopped.   Pt's diagnostic mammogram from 01/27/18 revealed calcifications in the left breast and suspicious bloody discharge from the nippy with palpable masses in 2 o'clock position. Her initial biopsy confirmed low grade DCIS partially involving an intraductal papilloma.  Prognostic indicators significant for: ER, 100% positive and PR, 100% positive, both with strong staining intensity.  She reports her last period was in Dec 2018 and she has starting experiencing hot flashes. She noticed to prior to her last period that it was getting lighter and more irregular.  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 She states she has HTN but has never been started on medication. She reports she has mood swings and she was prescribed Paxil but she has not tried it yet. She has a surgical history of breast tissue excision in her early 20's.   GYN HISTORY  Menarchal: 11-12 LMP: Dec 2018 Contraceptive: none  HRT: n/a  G6P3: first pregnancy at age 7  She has a FMHx of Ovarian CA by her grandmother and Breast CA by her cousin. She denies tobacco use and alcohol use.   Socially, she lives  in [facility] and she is not working.    MEDICAL HISTORY:  Past Medical History:  Diagnosis Date  . Anemia   . Anxiety   . 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 Hypertension   . Uterine fibroid     SURGICAL HISTORY: Past Surgical History:  Procedure Laterality Date  . TUBAL LIGATION      SOCIAL  HISTORY: Social History   Socioeconomic History  . Marital status: Single    Spouse name: Not on file  . Number of children: Not on file  . Years of education: Not on file  . Highest education level: Not on file  Occupational History  . Not on file  Social Needs  . Financial resource strain: Not on file  . Food insecurity:    Worry: Not on file    Inability: Not on file  . Transportation needs:    Medical: Not on file    Non-medical: Not on file  Tobacco Use  . Smoking status: Never Smoker  . Smokeless tobacco: Never Used  Substance and Sexual Activity  . Alcohol use: No  . Drug use: No  . Sexual activity: Yes  Lifestyle  . Physical activity:    Days per week: 2 days    Minutes per session: 50 min  . Stress: Very much  Relationships  . Social connections:    Talks on phone: More than three times a week    Gets together: More than three times a week    Attends religious service: More than 4 times per year    Active member of club or organization: No    Attends meetings of clubs or organizations: Never    Relationship status: Never married  . Intimate partner violence:    Fear of current or ex partner: No    Emotionally abused: No    Physically abused: No    Forced sexual activity: No  Other Topics Concern  . Not on file  Social History Narrative  . Not on file    FAMILY HISTORY: Family History  Problem Relation Age of Onset  . Hypertension Mother   . Cancer Maternal Grandmother        ovarian cancer   . Cancer Cousin        breast caner   . Leukemia Maternal Aunt   . Breast cancer Neg Hx     ALLERGIES:  has No Known Allergies.  MEDICATIONS:  Current Outpatient Medications  Medication Sig Dispense Refill  . ferrous sulfate 325 (65 FE) MG tablet Take 1 tablet (325 mg total) by mouth daily with breakfast. 30 tablet 3  . lisinopril (PRINIVIL,ZESTRIL) 10 MG tablet Take 1 tablet (10 mg total) by mouth daily. 30 tablet 1  . metroNIDAZOLE (FLAGYL) 500 MG  tablet Take 1 tablet (500 mg total) by mouth 2 (two) times daily. (Patient not taking: Reported on 02/09/2018) 14 tablet 0  . PARoxetine (PAXIL) 10 MG tablet Take 1 tablet (10 mg total) by mouth daily. (Patient not taking: Reported on 02/09/2018) 30 tablet 3  . venlafaxine XR (EFFEXOR-XR) 37.5 MG 24 hr capsule Take 1 capsule (37.5 mg total) by mouth daily with breakfast. 30 capsule 1   No current facility-administered medications for this visit.     REVIEW OF SYSTEMS:   Constitutional: Denies fevers, chills or abnormal night sweats (+) hot flash Eyes: Denies blurriness of vision, double vision or watery eyes Ears, nose, mouth, throat, and face: Denies mucositis or  sore throat Respiratory: Denies cough, dyspnea or wheezes Cardiovascular: Denies palpitation, chest discomfort or lower extremity swelling Gastrointestinal:  Denies nausea, heartburn or change in bowel habits Skin: Denies abnormal skin rashes Lymphatics: Denies new lymphadenopathy or easy bruising Neurological:Denies numbness, tingling or new weaknesses Behavioral/Psych: Mood is stable, no new changes  All other systems were reviewed with the patient and are negative.  PHYSICAL EXAMINATION: ECOG PERFORMANCE STATUS: 0 - Asymptomatic  Vitals:   02/09/18 1258  BP: (!) 158/89  Pulse: (!) 149  Resp: 18  Temp: 97.7 F (36.5 C)  SpO2: 100%   Filed Weights   02/09/18 1258  Weight: 172 lb 8 oz (78.2 kg)    GENERAL:alert, no distress and comfortable SKIN: skin color, texture, turgor are normal, no rashes or significant lesions EYES: normal, conjunctiva are pink and non-injected, sclera clear OROPHARYNX:no exudate, no erythema and lips, buccal mucosa, and tongue normal  NECK: supple, thyroid normal size, non-tender, without nodularity LYMPH:  no palpable lymphadenopathy in the cervical, axillary or inguinal LUNGS: clear to auscultation and percussion with normal breathing effort HEART: regular rate & rhythm and no murmurs and  no lower extremity edema ABDOMEN:abdomen soft, non-tender and normal bowel sounds Musculoskeletal:no cyanosis of digits and no clubbing  PSYCH: alert & oriented x 3 with fluent speech NEURO: no focal motor/sensory deficits Breasts: Breast inspection showed them to be symmetrical with no nipple discharge. Palpation of the breasts and axilla revealed 2 cm mass at the 2 o'clock position about 3 cm from the nipple. Slightly tender due to biopsy. She has another 5 mm mass was at 1 o'clock 6 cm from the nipple.   LABORATORY DATA:  I have reviewed the data as listed CBC Latest Ref Rng & Units 12/08/2017 06/07/2015 04/23/2015  WBC 4.0 - 10.5 K/uL 5.0 - 7.3  Hemoglobin 12.0 - 15.0 g/dL 8.9(L) 12.2 12.2  Hematocrit 36.0 - 46.0 % 31.8(L) 36.0 37.5  Platelets 150 - 400 K/uL 406(H) - 311   CMP Latest Ref Rng & Units 12/08/2017 06/07/2015 04/23/2015  Glucose 65 - 99 mg/dL 112(H) 103(H) 96  BUN 6 - 20 mg/dL 6 7 <5(L)  Creatinine 0.44 - 1.00 mg/dL 0.87 0.80 0.81  Sodium 135 - 145 mmol/L 139 141 136  Potassium 3.5 - 5.1 mmol/L 4.3 3.7 3.5  Chloride 101 - 111 mmol/L 104 103 103  CO2 22 - 32 mmol/L 22 - 23  Calcium 8.9 - 10.3 mg/dL 9.1 - 8.9   PATHOLOGY  Diagnosis 01/31/18 1. 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 - NEGATIVE FOR INVASIVE CARCINOMA. 2. 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 - NEGATIVE FOR INVASIVE CARCINOMA. 1 of 2 FINAL for SHEY, YOTT [id number]) Diagnosis Note 1. Dr. Lyndon Code has reviewed this case and concurs with the above diagnosis. The [facility] was notified on 02/01/18. A breast prognostic profile is pending and will be reported in an addendum. (NK:kh 02/01/18) Results: Estrogen Receptor: 100%, POSITIVE, STRONG STAINING INTENSITY Progesterone Receptor: 100%,  POSITIVE, STRONG STAINING INTENSITY  RADIOGRAPHIC STUDIES: I have personally reviewed the radiological images as listed and agreed with the findings in the report.  Diagnostic Mammogram and [place] 01/27/18 IMPRESSION: Suspicious bloody left nipple discharge with palpable masses in the 2 o'clock axis of the left breast. Findings are suspicious for Malignancy. ADDENDUM: Magnification views of  the retroareolar right breast were performed to evaluate calcifications seen on the patient's baseline mammogram. There are loosely grouped and scattered calcifications in the outer right breast, superior to and directly posterior to the nipple. These calcifications are rounded and smudgy in the CC projection, and many of them demonstrate layering on the 90 degree lateral magnification view, consistent with benign milk of calcium. No suspicious microcalcifications are seen in the right breast.  US Breast Ltd Uni Left Inc Axilla  Addendum Date: 02/03/2018   ADDENDUM REPORT: 02/03/2018 16:56 ADDENDUM: Magnification views of the retroareolar right breast were performed to evaluate calcifications seen on the patient's baseline mammogram. There are loosely grouped and scattered calcifications in the outer right breast, superior to and directly posterior to the nipple. These calcifications are rounded and smudgy in the CC projection, and many of them demonstrate layering on the 90 degree lateral magnification view, consistent with benign milk of calcium. No suspicious microcalcifications are seen in the right breast. Electronically Signed   By: Curlene Dolphin M.D.   On: 02/03/2018 16:56   Result Date: 02/03/2018 CLINICAL DATA:  47 year old patient has a history of spontaneous bloody left nipple discharge since January of 2019. She does palpate a lump in the outer left breast in the 2 o'clock region approximately 6 cm from the nipple. She was evaluated by the [facility] clinic today. This is her first mammogram. EXAM:  DIGITAL DIAGNOSTIC BILATERAL MAMMOGRAM WITH CAD AND TOMO ULTRASOUND LEFT BREAST COMPARISON:  None ACR Breast Density Category c: The breast tissue is heterogeneously dense, which may obscure small masses. FINDINGS: Metallic skin marker was placed on the region of palpable concern in the upper outer left breast, middle third. There is an asymmetry with possible distortion deep to the metallic skin marker in the lateral left breast, best seen in the CC projection. Extending posterior to this area in the far lateral left breast are multiple nearly coalescent small nodules. Extending anterior to the palpable area of concern, toward the nipple, is a tubular structure, which may reflect a dilated duct. No suspicious microcalcifications in the left breast. Of note, bloody nipple discharge extruded from the left nipple during acquisition of the mammogram images. In the right breast there is an asymmetry seen far posteriorly in the MLO projection, projecting over the pectoralis muscle. A focal spot compression view of this region shows probable fibroglandular tissue, and there is no correlate identified in the CC projection. After the patient left today, when reviewing the mammogram images again for dictation, I noted calcifications in the anterior right breast for which magnification views should be performed given that this is the patient's baseline mammogram Mammographic images were processed with CAD. On physical exam, there is a firm palpable mass in the 2 o'clock position of the left breast centered at 6 cm from the nipple with indistinct margins. Somewhat tubular feeling palpable firmness extends peripherally from this more dominant mass in the 2 o'clock position of the left breast, as well as more centrally, toward the nipple in the 2 o'clock axis. Targeted ultrasound is performed, showing ill-defined hypoechogenicity and probable dilated ducts and vascular flow in the 2 o'clock region approximately 6 cm from the  nipple in the dominant palpable area of concern. Dilated ducts and hypoechoic nodularity extends further peripherally, as far as 2 o'clock position approximately 10 cm from the nipple where there are discrete hypoechoic masses with vascular flow, one measuring 0.6 x 0.5 x 0.7 cm. There are similar appearing dilated ducts and hypoechoic  nodules in the 2 o'clock position of the left breast periareolar, with a discrete nodule seen at 2 o'clock position 3 cm from the nipple measuring 0.5 x 0.4 x 0.6 cm with internal flow, and a prominent dilated duct extending toward the nipple, with internal debris or masses. Bloody nipple discharge extruded from the left nipple during the ultrasound exam. Ultrasound of the left axilla is negative for lymphadenopathy. Ultrasound of the right breast shows normal fibroglandular tissue, and a small 0.6 cm cyst at 10:30 position 8 cm from nipple. No suspicious findings are seen on ultrasound of the right breast. IMPRESSION: Suspicious bloody left nipple discharge with palpable masses in the 2 o'clock axis of the left breast. Findings are suspicious for malignancy. Calcifications in the anterior third of the right breast for which magnification views should be performed. As these calcifications were recognized after the patient left today, they will be performed next Monday at her appointment for the biopsies. RECOMMENDATION: Two left breast biopsies are recommended: #1: I suggest biopsying the irregular hypoechoic mass and dilated ducts in the 2 o'clock position left breast 6 cm from the nipple, in the region of the most prominent palpable area of concern. #2: I suggest biopsying a discrete hypoechoic nodule with vascular flow seen at 2 o'clock position 10 cm from the nipple. A additionally, please note that disease appears to extend toward the nipple. Therefore, if pathology results are positive for malignancy, bilateral breast MRI is suggested. Magnification views of the right breast are  suggested, and can be added to the patient's diagnostic mammogram from today. I have discussed the findings and recommendations with the patient. Results were also provided in writing at the conclusion of the visit. If applicable, a reminder letter will be sent to the patient regarding the next appointment. BI-RADS CATEGORY  4: Suspicious. Electronically Signed: By: Curlene Dolphin M.D. On: 01/27/2018 16:53   US Breast Ltd Uni Right Inc Axilla  Addendum Date: 02/03/2018   ADDENDUM REPORT: 02/03/2018 16:56 ADDENDUM: Magnification views of the retroareolar right breast were performed to evaluate calcifications seen on the patient's baseline mammogram. There are loosely grouped and scattered calcifications in the outer right breast, superior to and directly posterior to the nipple. These calcifications are rounded and smudgy in the CC projection, and many of them demonstrate layering on the 90 degree lateral magnification view, consistent with benign milk of calcium. No suspicious microcalcifications are seen in the right breast. Electronically Signed   By: Curlene Dolphin M.D.   On: 02/03/2018 16:56   Result Date: 02/03/2018 CLINICAL DATA:  47 year old patient has a history of spontaneous bloody left nipple discharge since January of 2019. She does palpate a lump in the outer left breast in the 2 o'clock region approximately 6 cm from the nipple. She was evaluated by the [facility] clinic today. This is her first mammogram. EXAM: DIGITAL DIAGNOSTIC BILATERAL MAMMOGRAM WITH CAD AND TOMO ULTRASOUND LEFT BREAST COMPARISON:  None ACR Breast Density Category c: The breast tissue is heterogeneously dense, which may obscure small masses. FINDINGS: Metallic skin marker was placed on the region of palpable concern in the upper outer left breast, middle third. There is an asymmetry with possible distortion deep to the metallic skin marker in the lateral left breast, best seen in the CC projection. Extending posterior to this area  in the far lateral left breast are multiple nearly coalescent small nodules. Extending anterior to the palpable area of concern, toward the nipple, is a tubular structure, which may  reflect a dilated duct. No suspicious microcalcifications in the left breast. Of note, bloody nipple discharge extruded from the left nipple during acquisition of the mammogram images. In the right breast there is an asymmetry seen far posteriorly in the MLO projection, projecting over the pectoralis muscle. A focal spot compression view of this region shows probable fibroglandular tissue, and there is no correlate identified in the CC projection. After the patient left today, when reviewing the mammogram images again for dictation, I noted calcifications in the anterior right breast for which magnification views should be performed given that this is the patient's baseline mammogram Mammographic images were processed with CAD. On physical exam, there is a firm palpable mass in the 2 o'clock position of the left breast centered at 6 cm from the nipple with indistinct margins. Somewhat tubular feeling palpable firmness extends peripherally from this more dominant mass in the 2 o'clock position of the left breast, as well as more centrally, toward the nipple in the 2 o'clock axis. Targeted ultrasound is performed, showing ill-defined hypoechogenicity and probable dilated ducts and vascular flow in the 2 o'clock region approximately 6 cm from the nipple in the dominant palpable area of concern. Dilated ducts and hypoechoic nodularity extends further peripherally, as far as 2 o'clock position approximately 10 cm from the nipple where there are discrete hypoechoic masses with vascular flow, one measuring 0.6 x 0.5 x 0.7 cm. There are similar appearing dilated ducts and hypoechoic nodules in the 2 o'clock position of the left breast periareolar, with a discrete nodule seen at 2 o'clock position 3 cm from the nipple measuring 0.5 x 0.4 x 0.6 cm  with internal flow, and a prominent dilated duct extending toward the nipple, with internal debris or masses. Bloody nipple discharge extruded from the left nipple during the ultrasound exam. Ultrasound of the left axilla is negative for lymphadenopathy. Ultrasound of the right breast shows normal fibroglandular tissue, and a small 0.6 cm cyst at 10:30 position 8 cm from nipple. No suspicious findings are seen on ultrasound of the right breast. IMPRESSION: Suspicious bloody left nipple discharge with palpable masses in the 2 o'clock axis of the left breast. Findings are suspicious for malignancy. Calcifications in the anterior third of the right breast for which magnification views should be performed. As these calcifications were recognized after the patient left today, they will be performed next Monday at her appointment for the biopsies. RECOMMENDATION: Two left breast biopsies are recommended: #1: I suggest biopsying the irregular hypoechoic mass and dilated ducts in the 2 o'clock position left breast 6 cm from the nipple, in the region of the most prominent palpable area of concern. #2: I suggest biopsying a discrete hypoechoic nodule with vascular flow seen at 2 o'clock position 10 cm from the nipple. A additionally, please note that disease appears to extend toward the nipple. Therefore, if pathology results are positive for malignancy, bilateral breast MRI is suggested. Magnification views of the right breast are suggested, and can be added to the patient's diagnostic mammogram from today. I have discussed the findings and recommendations with the patient. Results were also provided in writing at the conclusion of the visit. If applicable, a reminder letter will be sent to the patient regarding the next appointment. BI-RADS CATEGORY  4: Suspicious. Electronically Signed: By: Curlene Dolphin M.D. On: 01/27/2018 16:53   Mm Diag Breast Tomo Bilateral  Addendum Date: 02/03/2018   ADDENDUM REPORT: 02/03/2018  16:56 ADDENDUM: Magnification views of the retroareolar right breast were  performed to evaluate calcifications seen on the patient's baseline mammogram. There are loosely grouped and scattered calcifications in the outer right breast, superior to and directly posterior to the nipple. These calcifications are rounded and smudgy in the CC projection, and many of them demonstrate layering on the 90 degree lateral magnification view, consistent with benign milk of calcium. No suspicious microcalcifications are seen in the right breast. Electronically Signed   By: Curlene Dolphin M.D.   On: 02/03/2018 16:56   Result Date: 02/03/2018 CLINICAL DATA:  47 year old patient has a history of spontaneous bloody left nipple discharge since January of 2019. She does palpate a lump in the outer left breast in the 2 o'clock region approximately 6 cm from the nipple. She was evaluated by the [facility] clinic today. This is her first mammogram. EXAM: DIGITAL DIAGNOSTIC BILATERAL MAMMOGRAM WITH CAD AND TOMO ULTRASOUND LEFT BREAST COMPARISON:  None ACR Breast Density Category c: The breast tissue is heterogeneously dense, which may obscure small masses. FINDINGS: Metallic skin marker was placed on the region of palpable concern in the upper outer left breast, middle third. There is an asymmetry with possible distortion deep to the metallic skin marker in the lateral left breast, best seen in the CC projection. Extending posterior to this area in the far lateral left breast are multiple nearly coalescent small nodules. Extending anterior to the palpable area of concern, toward the nipple, is a tubular structure, which may reflect a dilated duct. No suspicious microcalcifications in the left breast. Of note, bloody nipple discharge extruded from the left nipple during acquisition of the mammogram images. In the right breast there is an asymmetry seen far posteriorly in the MLO projection, projecting over the pectoralis muscle. A focal spot  compression view of this region shows probable fibroglandular tissue, and there is no correlate identified in the CC projection. After the patient left today, when reviewing the mammogram images again for dictation, I noted calcifications in the anterior right breast for which magnification views should be performed given that this is the patient's baseline mammogram Mammographic images were processed with CAD. On physical exam, there is a firm palpable mass in the 2 o'clock position of the left breast centered at 6 cm from the nipple with indistinct margins. Somewhat tubular feeling palpable firmness extends peripherally from this more dominant mass in the 2 o'clock position of the left breast, as well as more centrally, toward the nipple in the 2 o'clock axis. Targeted ultrasound is performed, showing ill-defined hypoechogenicity and probable dilated ducts and vascular flow in the 2 o'clock region approximately 6 cm from the nipple in the dominant palpable area of concern. Dilated ducts and hypoechoic nodularity extends further peripherally, as far as 2 o'clock position approximately 10 cm from the nipple where there are discrete hypoechoic masses with vascular flow, one measuring 0.6 x 0.5 x 0.7 cm. There are similar appearing dilated ducts and hypoechoic nodules in the 2 o'clock position of the left breast periareolar, with a discrete nodule seen at 2 o'clock position 3 cm from the nipple measuring 0.5 x 0.4 x 0.6 cm with internal flow, and a prominent dilated duct extending toward the nipple, with internal debris or masses. Bloody nipple discharge extruded from the left nipple during the ultrasound exam. Ultrasound of the left axilla is negative for lymphadenopathy. Ultrasound of the right breast shows normal fibroglandular tissue, and a small 0.6 cm cyst at 10:30 position 8 cm from nipple. No suspicious findings are seen on ultrasound of the  right breast. IMPRESSION: Suspicious bloody left nipple discharge  with palpable masses in the 2 o'clock axis of the left breast. Findings are suspicious for malignancy. Calcifications in the anterior third of the right breast for which magnification views should be performed. As these calcifications were recognized after the patient left today, they will be performed next Monday at her appointment for the biopsies. RECOMMENDATION: Two left breast biopsies are recommended: #1: I suggest biopsying the irregular hypoechoic mass and dilated ducts in the 2 o'clock position left breast 6 cm from the nipple, in the region of the most prominent palpable area of concern. #2: I suggest biopsying a discrete hypoechoic nodule with vascular flow seen at 2 o'clock position 10 cm from the nipple. A additionally, please note that disease appears to extend toward the nipple. Therefore, if pathology results are positive for malignancy, bilateral breast MRI is suggested. Magnification views of the right breast are suggested, and can be added to the patient's diagnostic mammogram from today. I have discussed the findings and recommendations with the patient. Results were also provided in writing at the conclusion of the visit. If applicable, a reminder letter will be sent to the patient regarding the next appointment. BI-RADS CATEGORY  4: Suspicious. Electronically Signed: By: Curlene Dolphin M.D. On: 01/27/2018 16:53   Mm Clip Placement Left  Result Date: 01/31/2018 CLINICAL DATA:  Post 2 ultrasound-guided core needle biopsy of left breast. EXAM: DIAGNOSTIC LEFT MAMMOGRAM POST ULTRASOUND BIOPSY COMPARISON:  Previous exam(s). FINDINGS: Mammographic images were obtained following ultrasound guided biopsy of left breast. Two-view mammography demonstrates presence of ribbon shaped marker at 2 o'clock 6 cm from the nipple in an area of focal asymmetry seen mammographically. This corresponds to the area of palpable concern. The heart shaped marker is located more posteriorly at 2 o'clock 10 cm from the  nipple, again within an area of focal asymmetry. Expected post biopsy changes are seen. IMPRESSION: Successful placement of post biopsy markers, post 2 left breast core needle biopsies: Left breast 2 o'clock 6 cm from the nipple-ribbon shaped marker Left breast 2 o'clock 10 cm from the nipple-heart shaped marker. Final Assessment: Post Procedure Mammograms for Marker Placement Electronically Signed   By: Fidela Salisbury M.D.   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 This was found to be concordant by Dr. Fidela Salisbury. Pathology results were discussed with the patient by telephone. The patient reported doing well after the biopsy with tenderness at the site. Post biopsy instructions and care were reviewed and questions were answered. The patient was encouraged to call [facility] for any additional concerns. The patient was referred to [facility] on February 09, 2018. A breast MRI is recommended for LEFT breast bloody nipple discharge and extent of disease. Pathology results reported by Roselind Messier, RN on 02/02/2018. Electronically Signed   By: Fidela Salisbury M.D.   On: 02/02/2018 13:00   Result Date: 02/03/2018 CLINICAL DATA:  Ultrasound-guided core needle biopsy of an area of palpable concern in the left breast 2 o'clock 6 cm  from the nipple, and a nodule in the left breast 2 o'clock 10 cm from the nipple. EXAM: ULTRASOUND GUIDED LEFT BREAST CORE NEEDLE BIOPSY COMPARISON:  Previous exam(s). FINDINGS: I met  with the patient and we discussed the procedure of ultrasound-guided biopsy, including benefits and alternatives. We discussed the high likelihood of a successful procedure. We discussed the risks of the procedure, including infection, bleeding, tissue injury, clip migration, and inadequate sampling. Informed written consent was given. The usual time-out protocol was performed immediately prior to the procedure. Lesion quadrant: Upper outer quadrant Using sterile technique and 1% Lidocaine as local anesthetic, under direct ultrasound visualization, a 14 gauge spring-loaded device was used to perform biopsy of left breast palpable hypoechoic area/dilated ducts at 2 o'clock 6 cm from the nipple using a inferior approach. At the conclusion of the procedure a ribbon shaped tissue marker clip was deployed into the biopsy cavity. Next, using sterile technique and 1% Lidocaine as local anesthetic, under direct ultrasound visualization, a 14 gauge spring-loaded device was used to perform biopsy of left breast 2 o'clock 10 cm from the nipple nodule using a inferior approach. At the conclusion of the procedure a heart shaped tissue marker clip was deployed into the biopsy cavity. Follow up 2 view mammogram was performed and dictated separately. IMPRESSION: Ultrasound guided biopsy of left breast: 2 o'clock 6 cm from the nipple-ribbon shaped marker 2 o'clock 10 cm from the nipple-heart shaped marker. No apparent complications. Electronically Signed: By: Fidela Salisbury M.D. 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 This was  found to be concordant by Dr. Fidela Salisbury. Pathology results were discussed with the patient by telephone. The patient reported doing well after the biopsy with tenderness at the site. Post biopsy instructions and care were reviewed and questions were answered. The patient was encouraged to call [facility] for any additional concerns. The patient was referred to [facility] on February 09, 2018. A breast MRI is recommended for LEFT breast bloody nipple discharge and extent of disease. Pathology results reported by Roselind Messier, RN on 02/02/2018. Electronically Signed   By: Fidela Salisbury M.D.   On: 02/02/2018 13:00   Result Date: 02/03/2018 CLINICAL DATA:  Ultrasound-guided core needle biopsy of an area of palpable concern in the left breast 2 o'clock 6 cm from the nipple, and a nodule in the left breast 2 o'clock 10 cm from the nipple. EXAM: ULTRASOUND GUIDED LEFT BREAST CORE NEEDLE BIOPSY COMPARISON:  Previous exam(s). FINDINGS: I met with the patient and we discussed the procedure of ultrasound-guided biopsy, including benefits and alternatives. We discussed the high likelihood of a successful procedure. We discussed the risks of the procedure, including infection, bleeding, tissue injury, clip migration, and inadequate sampling. Informed written consent was given. The usual time-out protocol was performed immediately prior to the procedure. Lesion quadrant: Upper outer quadrant Using sterile technique and 1% Lidocaine as local anesthetic, under direct ultrasound visualization, a 14 gauge spring-loaded device was used to perform biopsy of left breast palpable hypoechoic area/dilated ducts at  2 o'clock 6 cm from the nipple using a inferior approach. At the conclusion of the procedure a ribbon shaped tissue marker clip was deployed into the biopsy cavity. Next, using sterile technique and 1% Lidocaine  as local anesthetic, under direct ultrasound visualization, a 14 gauge spring-loaded device was used to perform biopsy of left breast 2 o'clock 10 cm from the nipple nodule using a inferior approach. At the conclusion of the procedure a heart shaped tissue marker clip was deployed into the biopsy cavity. Follow up 2 view mammogram was performed and dictated separately. IMPRESSION: Ultrasound guided biopsy of left breast: 2 o'clock 6 cm from the nipple-ribbon shaped marker 2 o'clock 10 cm from the nipple-heart shaped marker. No apparent complications. Electronically Signed: By: Fidela Salisbury M.D. On: 01/31/2018 16:46    ASSESSMENT & PLAN:  Casey Baker is 47 year old pre-menopausal woman, presented with screening discovered to DCIS.   1. Left Breast DCIS, low grade, ER/PR positve --We discussed her imaging findings and the biopsy results in great details. -She may not be a good candidate for breast conservation surgery due to the location of her two foci of DCIS. She has been seen by breast surgeon Dr. Excell Seltzer, who recommends left mastectomy. Her DCIS will be cured by complete surgical resection. Any form of adjuvant therapy is preventive. -We also discussed that biopsy may have sampling limitation, we will review her surgical path, to see if she has any invasive carcinoma components.  She is at high risk for second breast cancer which appears -She was also seen by radiation oncologist Dr. Sondra Come today. She will not need post mastectomy radiation unless her sentinel lymph node is positive.    -Given her family history of breast cancer and age of 47 years old, genetic testing is was recommended to her. Referral made today.  --Giving her strongly ER and PR positivity of the tumor cells, and her pre-menopause status, I recommend adjuvant endocrine therapy with tamoxifen to prevent future breast cancer.  The potential side effects, which includes but not limited to, hot flash, skin and vaginal  dryness, slightly increased risk of cardiovascular disease and cataract, small risk of thrombosis and endometrial cancer, were discussed with her in great details. Preventive strategies for thrombosis, such as being physically active, using compression stocks, avoid cigarette smoking, etc., were reviewed with her. I also recommend her to follow-up with her gynecologist once a year, and watch for vaginal spotting or bleeding, as a clinically sign of endometrial cancer, etc. She voiced good understanding, and she is interested. Will discuss more at our next visit -We also discussed the breast cancer surveillance after her surgery. She will continue annual screening mammogram, self exam, and a routine office visit with lab and exam with [place]. -I encouraged her to have healthy diet and exercise regularly -F/u after surgery  2. HTN  -She reports she has had high blood pressure in every doctor's visit but no one has ever started her on medication. She does not have a meter at home.  -I prescribed 10 mg lisinopril today and advised her to check her BP more regularly.    3. Anemia, likely iron deficiency from menorrhagia -She has a hx of Anemia secondary to menorrhagia. She takes oral iron daily and has had IV iron and blood transfusion in the past. Last transfusion was 2 years ago -Will monitor her labs while she is being followed by me and I will consider IV iron if needed.  -lab today with iron studies  4. Hot Flashes and Mood disorder -Her last period was Dec 2018 and she has been having hot flashes since. Her PCP recently prescribed Paxil but she has not taken it yet. I discussed that there is drug interaction with Tamoxifen so I will switch her to Effexor 75 mg which will also help her hot flashes.  -I offered social work consult but she declined   5. Financial Assistance  -She currently does not have insurance but she is applying for medicaid now. I discussed [place]'s financial assistance  program with co pay. She stated she might be interested in the future.    Orders Placed This Encounter  Procedures  . CBC with Differential (Cancer Center Only)    Standing Status:   Future    Standing Expiration Date:   02/10/2019  . CMP ([facility] only)    Standing Status:   Future    Standing Expiration Date:   02/10/2019  . Ferritin    Standing Status:   Future    Standing Expiration Date:   02/09/2019  . Iron and TIBC    Standing Status:   Future    Standing Expiration Date:   02/09/2019   PLAN:  -left mastectomy with Dr. Excell Seltzer soon -prescribed Effexor and lisinopril today -Genetic referral  -[employer] consent -Lab today with iron studies, CBC and CMP   All questions were answered. The patient knows to call the clinic with any problems, questions or concerns. I spent 45 minutes counseling the patient face to face. The total time spent in the appointment was 60 minutes and more than 50% was on counseling.   This document serves as a record of services personally performed by Truitt Merle, MD. It was created on her behalf by Theresia Bough, a trained medical scribe. The creation of this record is based on the scribe's personal observations and the provider's statements to them.   I have reviewed the above documentation for accuracy and completeness, and I agree with the above.    Truitt Merle, MD 02/09/2018

## 2018-02-09 ENCOUNTER — Inpatient Hospital Stay: Payer: Self-pay | Attending: Hematology | Admitting: Hematology

## 2018-02-09 ENCOUNTER — Other Ambulatory Visit: Payer: Self-pay

## 2018-02-09 ENCOUNTER — Encounter: Payer: Self-pay | Admitting: Medical Oncology

## 2018-02-09 ENCOUNTER — Ambulatory Visit
Admission: RE | Admit: 2018-02-09 | Discharge: 2018-02-09 | Disposition: A | Discharge status: Home / Self Care | Payer: Self-pay | Source: Ambulatory Visit | Attending: Radiation Oncology | Admitting: Radiation Oncology

## 2018-02-09 ENCOUNTER — Encounter: Payer: Self-pay | Admitting: Hematology

## 2018-02-09 VITALS — BP 158/89 | HR 149 | Temp 97.7°F | Resp 18 | Ht 67.0 in | Wt 172.5 lb

## 2018-02-09 DIAGNOSIS — F39 Unspecified mood [affective] disorder: Secondary | ICD-10-CM | POA: Insufficient documentation

## 2018-02-09 DIAGNOSIS — Z79899 Other long term (current) drug therapy: Secondary | ICD-10-CM | POA: Insufficient documentation

## 2018-02-09 DIAGNOSIS — D0512 Intraductal carcinoma in situ of left breast: Secondary | ICD-10-CM | POA: Insufficient documentation

## 2018-02-09 DIAGNOSIS — D500 Iron deficiency anemia secondary to blood loss (chronic): Secondary | ICD-10-CM

## 2018-02-09 DIAGNOSIS — I10 Essential (primary) hypertension: Secondary | ICD-10-CM | POA: Insufficient documentation

## 2018-02-09 DIAGNOSIS — N951 Menopausal and female climacteric states: Secondary | ICD-10-CM | POA: Insufficient documentation

## 2018-02-09 DIAGNOSIS — Z170 Estrogen receptor positive status [ER+]: Secondary | ICD-10-CM | POA: Insufficient documentation

## 2018-02-09 MED ORDER — LISINOPRIL 10 MG PO TABS: 10.0000 mg | ORAL_TABLET | Freq: Every day | ORAL | Status: DC

## 2018-02-09 MED ORDER — LISINOPRIL 10 MG PO TABS: 10.0000 mg | ORAL_TABLET | Freq: Every day | ORAL | 1 refills | Status: DC

## 2018-02-09 MED ORDER — VENLAFAXINE HCL ER 37.5 MG PO CP24: 37.5000 mg | ORAL_CAPSULE | Freq: Every day | ORAL | 1 refills | Status: DC

## 2018-02-09 NOTE — Progress Notes (Signed)
Radiation Oncology         [phone] ________________________________  Multidisciplinary Breast Oncology Clinic [facility]) Initial Outpatient Consultation  Name: CIONNA COLLANTES MRN: [id number]  Date: 02/09/2018  DOB: 11/08/1970  EU:[id number], No Pcp Per  Excell Seltzer, MD   REFERRING PHYSICIAN: Excell Seltzer, MD  DIAGNOSIS: The encounter diagnosis was Ductal carcinoma in situ (DCIS) of left breast. Clinical stage 0 (Tis, Nx) Left Breast UOQ Ductal Carcinoma In Situ, ER(+) / PR(+) / Her2 (not indicated), Low-grade    ICD-10-CM   1. Ductal carcinoma in situ (DCIS) of left breast D05.12     HISTORY OF PRESENT ILLNESS::Eleanor L Hosack is a 47 y.o. female who is presenting to the office today for evaluation of her newly diagnosed breast cancer.   She underwent bilateral diagnostic mammography with tomography and bilateral breast ultrasonography at [facility] on 01/27/2018 showing: Breast density category C. Suspicious bloody left nipple discharge with palpable masses in the 2 o'clock axis of the left breast. Findings are suspicious for malignancy. Calcifications in the anterior third of the right breast for which magnification views should be performed. There are loosely grouped and scattered calcifications in the outer right breast, superior to and directly posterior to the nipple. These calcifications are rounded and smudgy in the CC projection, and many of them demonstrate layering on the 90 degree lateral magnification view, consistent with benign milk of calcium. No suspicious microcalcifications are seen in the right breast.  Accordingly on 01/31/2018 she proceeded to biopsy of the left breast area in question. The pathology from this procedure showed: Breast, left, needle core biopsy, 2 o'clock, 6 cfn with low-grade ductal carcinoma in situ (DCIS) partially involving an intraductal papilloma. Negative for invasive carcinoma. Breast, left, needle core biopsy, 2 o'clock, 10  cfn with low-grade ductal carcinoma in situ (DCIS) partially involving an intraductal papilloma. Negative for invasive carcinoma. Prognostic indicators significant for: estrogen receptor, 100% positive and progesterone receptor, 100% positive, both with strong staining intensity.  On review of systems, She reports insomnia, dental issues, left breast pain and nipple discharge. She denies any other symptoms.    Menarche: 47 years old Age at first live birth: 47 years old GP: GxP3 LMP: December 2018 Contraceptive: No HRT: No   The patient was referred today for presentation in the multidisciplinary conference.  Radiology studies and pathology slides were presented there for review and discussion of treatment options.  A consensus was discussed regarding potential next steps.  PREVIOUS RADIATION THERAPY: No  PAST MEDICAL HISTORY:  has a past medical history of Anemia, Anxiety, Ductal carcinoma in situ (DCIS) of left breast (02/03/2018), Hypertension, and Uterine fibroid.    PAST SURGICAL HISTORY: Past Surgical History:  Procedure Laterality Date  . TUBAL LIGATION      FAMILY HISTORY: family history includes Cancer in her cousin and maternal grandmother; Hypertension in her mother; Leukemia in her maternal aunt.  SOCIAL HISTORY:  reports that she has never smoked. She has never used smokeless tobacco. She reports that she does not drink alcohol or use drugs.  ALLERGIES: Patient has no known allergies.  MEDICATIONS:  Current Outpatient Medications  Medication Sig Dispense Refill  . ferrous sulfate 325 (65 FE) MG tablet Take 1 tablet (325 mg total) by mouth daily with breakfast. 30 tablet 3  . lisinopril (PRINIVIL,ZESTRIL) 10 MG tablet Take 1 tablet (10 mg total) by mouth daily. 30 tablet 1  . metroNIDAZOLE (FLAGYL) 500 MG tablet Take 1 tablet (500 mg total) by mouth  2 (two) times daily. (Patient not taking: Reported on 02/09/2018) 14 tablet 0  . PARoxetine (PAXIL) 10 MG tablet Take 1  tablet (10 mg total) by mouth daily. (Patient not taking: Reported on 02/09/2018) 30 tablet 3  . venlafaxine XR (EFFEXOR-XR) 37.5 MG 24 hr capsule Take 1 capsule (37.5 mg total) by mouth daily with breakfast. 30 capsule 1   No current facility-administered medications for this encounter.     REVIEW OF SYSTEMS:  REVIEW OF SYSTEMS: A 10+ POINT REVIEW OF SYSTEMS WAS OBTAINED including neurology, dermatology, psychiatry, cardiac, respiratory, lymph, extremities, GI, GU, musculoskeletal, constitutional, reproductive, HEENT. All pertinent positives are noted in the HPI. All others are negative.   PHYSICAL EXAM:  Vitals with BMI 02/09/2018  Height '5\' 7"'   Weight 172 lbs 8 oz  BMI 65.78  Systolic 469  Diastolic 89  Pulse 629  Respirations 18    General: Alert and oriented, in no acute distress HEENT: Head is normocephalic. Extraocular movements are intact. Oropharynx is clear. Neck: Neck is supple, no palpable cervical or supraclavicular lymphadenopathy. Heart: Regular in rate and rhythm with no murmurs, rubs, or gallops. Chest: Clear to auscultation bilaterally, with no rhonchi, wheezes, or rales. Abdomen: Soft, nontender, nondistended, with no rigidity or guarding. Extremities: No cyanosis or edema. Lymphatics: see Neck Exam Skin: No concerning lesions. Musculoskeletal: symmetric strength and muscle tone throughout. Neurologic: Cranial nerves II through XII are grossly intact. No obvious focalities. Speech is fluent. Coordination is intact. Psychiatric: Judgment and insight are intact. Affect is appropriate. Breast: Right breast large and pendulous without mass or nipple discharge. Left breast large and pendulous with palpable area of induration measuring approximately 5 x 6 cm in the 3 o'clock position of the breast. Bruising lateral to this palpable area of induration. No nipple discharge or bleeding.     KPS = 100  100 - Normal; no complaints; no evidence of disease. 90   - Able to  carry on normal activity; minor signs or symptoms of disease. 80   - Normal activity with effort; some signs or symptoms of disease. 3   - Cares for self; unable to carry on normal activity or to do active work. 60   - Requires occasional assistance, but is able to care for most of his personal needs. 50   - Requires considerable assistance and frequent medical care. 47   - Disabled; requires special care and assistance. 21   - Severely disabled; hospital admission is indicated although death not imminent. 89   - Very sick; hospital admission necessary; active supportive treatment necessary. 10   - Moribund; fatal processes progressing rapidly. 0     - Dead  Karnofsky DA, Abelmann Meagher, Craver LS and Burchenal [facility] [phone]) The use of the nitrogen mustards in the palliative treatment of carcinoma: with particular reference to bronchogenic carcinoma Cancer 1 634-56  LABORATORY DATA:  Lab Results  Component Value Date   WBC 5.0 12/08/2017   HGB 8.9 (L) 12/08/2017   HCT 31.8 (L) 12/08/2017   MCV 63.2 (L) 12/08/2017   PLT 406 (H) 12/08/2017   Lab Results  Component Value Date   NA 139 12/08/2017   K 4.3 12/08/2017   CL 104 12/08/2017   CO2 22 12/08/2017   No results found for: ALT, AST, GGT, ALKPHOS, BILITOT  PULMONARY FUNCTION TEST:   Recent Review Flowsheet Data    There is no flowsheet data to display.      RADIOGRAPHY: US Breast Ltd Uni Left Inc  Axilla  Addendum Date: 02/03/2018   ADDENDUM REPORT: 02/03/2018 16:56 ADDENDUM: Magnification views of the retroareolar right breast were performed to evaluate calcifications seen on the patient's baseline mammogram. There are loosely grouped and scattered calcifications in the outer right breast, superior to and directly posterior to the nipple. These calcifications are rounded and smudgy in the CC projection, and many of them demonstrate layering on the 90 degree lateral magnification view, consistent with benign milk of calcium. No  suspicious microcalcifications are seen in the right breast. Electronically Signed   By: Curlene Dolphin M.D.   On: 02/03/2018 16:56   Result Date: 02/03/2018 CLINICAL DATA:  47 year old patient has a history of spontaneous bloody left nipple discharge since January of 2019. She does palpate a lump in the outer left breast in the 2 o'clock region approximately 6 cm from the nipple. She was evaluated by the [facility] clinic today. This is her first mammogram. EXAM: DIGITAL DIAGNOSTIC BILATERAL MAMMOGRAM WITH CAD AND TOMO ULTRASOUND LEFT BREAST COMPARISON:  None ACR Breast Density Category c: The breast tissue is heterogeneously dense, which may obscure small masses. FINDINGS: Metallic skin marker was placed on the region of palpable concern in the upper outer left breast, middle third. There is an asymmetry with possible distortion deep to the metallic skin marker in the lateral left breast, best seen in the CC projection. Extending posterior to this area in the far lateral left breast are multiple nearly coalescent small nodules. Extending anterior to the palpable area of concern, toward the nipple, is a tubular structure, which may reflect a dilated duct. No suspicious microcalcifications in the left breast. Of note, bloody nipple discharge extruded from the left nipple during acquisition of the mammogram images. In the right breast there is an asymmetry seen far posteriorly in the MLO projection, projecting over the pectoralis muscle. A focal spot compression view of this region shows probable fibroglandular tissue, and there is no correlate identified in the CC projection. After the patient left today, when reviewing the mammogram images again for dictation, I noted calcifications in the anterior right breast for which magnification views should be performed given that this is the patient's baseline mammogram Mammographic images were processed with CAD. On physical exam, there is a firm palpable mass in the 2 o'clock  position of the left breast centered at 6 cm from the nipple with indistinct margins. Somewhat tubular feeling palpable firmness extends peripherally from this more dominant mass in the 2 o'clock position of the left breast, as well as more centrally, toward the nipple in the 2 o'clock axis. Targeted ultrasound is performed, showing ill-defined hypoechogenicity and probable dilated ducts and vascular flow in the 2 o'clock region approximately 6 cm from the nipple in the dominant palpable area of concern. Dilated ducts and hypoechoic nodularity extends further peripherally, as far as 2 o'clock position approximately 10 cm from the nipple where there are discrete hypoechoic masses with vascular flow, one measuring 0.6 x 0.5 x 0.7 cm. There are similar appearing dilated ducts and hypoechoic nodules in the 2 o'clock position of the left breast periareolar, with a discrete nodule seen at 2 o'clock position 3 cm from the nipple measuring 0.5 x 0.4 x 0.6 cm with internal flow, and a prominent dilated duct extending toward the nipple, with internal debris or masses. Bloody nipple discharge extruded from the left nipple during the ultrasound exam. Ultrasound of the left axilla is negative for lymphadenopathy. Ultrasound of the right breast shows normal fibroglandular tissue, and  a small 0.6 cm cyst at 10:30 position 8 cm from nipple. No suspicious findings are seen on ultrasound of the right breast. IMPRESSION: Suspicious bloody left nipple discharge with palpable masses in the 2 o'clock axis of the left breast. Findings are suspicious for malignancy. Calcifications in the anterior third of the right breast for which magnification views should be performed. As these calcifications were recognized after the patient left today, they will be performed next Monday at her appointment for the biopsies. RECOMMENDATION: Two left breast biopsies are recommended: #1: I suggest biopsying the irregular hypoechoic mass and dilated ducts  in the 2 o'clock position left breast 6 cm from the nipple, in the region of the most prominent palpable area of concern. #2: I suggest biopsying a discrete hypoechoic nodule with vascular flow seen at 2 o'clock position 10 cm from the nipple. A additionally, please note that disease appears to extend toward the nipple. Therefore, if pathology results are positive for malignancy, bilateral breast MRI is suggested. Magnification views of the right breast are suggested, and can be added to the patient's diagnostic mammogram from today. I have discussed the findings and recommendations with the patient. Results were also provided in writing at the conclusion of the visit. If applicable, a reminder letter will be sent to the patient regarding the next appointment. BI-RADS CATEGORY  4: Suspicious. Electronically Signed: By: Curlene Dolphin M.D. On: 01/27/2018 16:53   US Breast Ltd Uni Right Inc Axilla  Addendum Date: 02/03/2018   ADDENDUM REPORT: 02/03/2018 16:56 ADDENDUM: Magnification views of the retroareolar right breast were performed to evaluate calcifications seen on the patient's baseline mammogram. There are loosely grouped and scattered calcifications in the outer right breast, superior to and directly posterior to the nipple. These calcifications are rounded and smudgy in the CC projection, and many of them demonstrate layering on the 90 degree lateral magnification view, consistent with benign milk of calcium. No suspicious microcalcifications are seen in the right breast. Electronically Signed   By: Curlene Dolphin M.D.   On: 02/03/2018 16:56   Result Date: 02/03/2018 CLINICAL DATA:  47 year old patient has a history of spontaneous bloody left nipple discharge since January of 2019. She does palpate a lump in the outer left breast in the 2 o'clock region approximately 6 cm from the nipple. She was evaluated by the [facility] clinic today. This is her first mammogram. EXAM: DIGITAL DIAGNOSTIC BILATERAL MAMMOGRAM  WITH CAD AND TOMO ULTRASOUND LEFT BREAST COMPARISON:  None ACR Breast Density Category c: The breast tissue is heterogeneously dense, which may obscure small masses. FINDINGS: Metallic skin marker was placed on the region of palpable concern in the upper outer left breast, middle third. There is an asymmetry with possible distortion deep to the metallic skin marker in the lateral left breast, best seen in the CC projection. Extending posterior to this area in the far lateral left breast are multiple nearly coalescent small nodules. Extending anterior to the palpable area of concern, toward the nipple, is a tubular structure, which may reflect a dilated duct. No suspicious microcalcifications in the left breast. Of note, bloody nipple discharge extruded from the left nipple during acquisition of the mammogram images. In the right breast there is an asymmetry seen far posteriorly in the MLO projection, projecting over the pectoralis muscle. A focal spot compression view of this region shows probable fibroglandular tissue, and there is no correlate identified in the CC projection. After the patient left today, when reviewing the mammogram images again  for dictation, I noted calcifications in the anterior right breast for which magnification views should be performed given that this is the patient's baseline mammogram Mammographic images were processed with CAD. On physical exam, there is a firm palpable mass in the 2 o'clock position of the left breast centered at 6 cm from the nipple with indistinct margins. Somewhat tubular feeling palpable firmness extends peripherally from this more dominant mass in the 2 o'clock position of the left breast, as well as more centrally, toward the nipple in the 2 o'clock axis. Targeted ultrasound is performed, showing ill-defined hypoechogenicity and probable dilated ducts and vascular flow in the 2 o'clock region approximately 6 cm from the nipple in the dominant palpable area of  concern. Dilated ducts and hypoechoic nodularity extends further peripherally, as far as 2 o'clock position approximately 10 cm from the nipple where there are discrete hypoechoic masses with vascular flow, one measuring 0.6 x 0.5 x 0.7 cm. There are similar appearing dilated ducts and hypoechoic nodules in the 2 o'clock position of the left breast periareolar, with a discrete nodule seen at 2 o'clock position 3 cm from the nipple measuring 0.5 x 0.4 x 0.6 cm with internal flow, and a prominent dilated duct extending toward the nipple, with internal debris or masses. Bloody nipple discharge extruded from the left nipple during the ultrasound exam. Ultrasound of the left axilla is negative for lymphadenopathy. Ultrasound of the right breast shows normal fibroglandular tissue, and a small 0.6 cm cyst at 10:30 position 8 cm from nipple. No suspicious findings are seen on ultrasound of the right breast. IMPRESSION: Suspicious bloody left nipple discharge with palpable masses in the 2 o'clock axis of the left breast. Findings are suspicious for malignancy. Calcifications in the anterior third of the right breast for which magnification views should be performed. As these calcifications were recognized after the patient left today, they will be performed next Monday at her appointment for the biopsies. RECOMMENDATION: Two left breast biopsies are recommended: #1: I suggest biopsying the irregular hypoechoic mass and dilated ducts in the 2 o'clock position left breast 6 cm from the nipple, in the region of the most prominent palpable area of concern. #2: I suggest biopsying a discrete hypoechoic nodule with vascular flow seen at 2 o'clock position 10 cm from the nipple. A additionally, please note that disease appears to extend toward the nipple. Therefore, if pathology results are positive for malignancy, bilateral breast MRI is suggested. Magnification views of the right breast are suggested, and can be added to the  patient's diagnostic mammogram from today. I have discussed the findings and recommendations with the patient. Results were also provided in writing at the conclusion of the visit. If applicable, a reminder letter will be sent to the patient regarding the next appointment. BI-RADS CATEGORY  4: Suspicious. Electronically Signed: By: Curlene Dolphin M.D. On: 01/27/2018 16:53   Mm Diag Breast Tomo Bilateral  Addendum Date: 02/03/2018   ADDENDUM REPORT: 02/03/2018 16:56 ADDENDUM: Magnification views of the retroareolar right breast were performed to evaluate calcifications seen on the patient's baseline mammogram. There are loosely grouped and scattered calcifications in the outer right breast, superior to and directly posterior to the nipple. These calcifications are rounded and smudgy in the CC projection, and many of them demonstrate layering on the 90 degree lateral magnification view, consistent with benign milk of calcium. No suspicious microcalcifications are seen in the right breast. Electronically Signed   By: Curlene Dolphin M.D.   On:  02/03/2018 16:56   Result Date: 02/03/2018 CLINICAL DATA:  47 year old patient has a history of spontaneous bloody left nipple discharge since January of 2019. She does palpate a lump in the outer left breast in the 2 o'clock region approximately 6 cm from the nipple. She was evaluated by the [facility] clinic today. This is her first mammogram. EXAM: DIGITAL DIAGNOSTIC BILATERAL MAMMOGRAM WITH CAD AND TOMO ULTRASOUND LEFT BREAST COMPARISON:  None ACR Breast Density Category c: The breast tissue is heterogeneously dense, which may obscure small masses. FINDINGS: Metallic skin marker was placed on the region of palpable concern in the upper outer left breast, middle third. There is an asymmetry with possible distortion deep to the metallic skin marker in the lateral left breast, best seen in the CC projection. Extending posterior to this area in the far lateral left breast are  multiple nearly coalescent small nodules. Extending anterior to the palpable area of concern, toward the nipple, is a tubular structure, which may reflect a dilated duct. No suspicious microcalcifications in the left breast. Of note, bloody nipple discharge extruded from the left nipple during acquisition of the mammogram images. In the right breast there is an asymmetry seen far posteriorly in the MLO projection, projecting over the pectoralis muscle. A focal spot compression view of this region shows probable fibroglandular tissue, and there is no correlate identified in the CC projection. After the patient left today, when reviewing the mammogram images again for dictation, I noted calcifications in the anterior right breast for which magnification views should be performed given that this is the patient's baseline mammogram Mammographic images were processed with CAD. On physical exam, there is a firm palpable mass in the 2 o'clock position of the left breast centered at 6 cm from the nipple with indistinct margins. Somewhat tubular feeling palpable firmness extends peripherally from this more dominant mass in the 2 o'clock position of the left breast, as well as more centrally, toward the nipple in the 2 o'clock axis. Targeted ultrasound is performed, showing ill-defined hypoechogenicity and probable dilated ducts and vascular flow in the 2 o'clock region approximately 6 cm from the nipple in the dominant palpable area of concern. Dilated ducts and hypoechoic nodularity extends further peripherally, as far as 2 o'clock position approximately 10 cm from the nipple where there are discrete hypoechoic masses with vascular flow, one measuring 0.6 x 0.5 x 0.7 cm. There are similar appearing dilated ducts and hypoechoic nodules in the 2 o'clock position of the left breast periareolar, with a discrete nodule seen at 2 o'clock position 3 cm from the nipple measuring 0.5 x 0.4 x 0.6 cm with internal flow, and a  prominent dilated duct extending toward the nipple, with internal debris or masses. Bloody nipple discharge extruded from the left nipple during the ultrasound exam. Ultrasound of the left axilla is negative for lymphadenopathy. Ultrasound of the right breast shows normal fibroglandular tissue, and a small 0.6 cm cyst at 10:30 position 8 cm from nipple. No suspicious findings are seen on ultrasound of the right breast. IMPRESSION: Suspicious bloody left nipple discharge with palpable masses in the 2 o'clock axis of the left breast. Findings are suspicious for malignancy. Calcifications in the anterior third of the right breast for which magnification views should be performed. As these calcifications were recognized after the patient left today, they will be performed next Monday at her appointment for the biopsies. RECOMMENDATION: Two left breast biopsies are recommended: #1: I suggest biopsying the irregular hypoechoic mass and  dilated ducts in the 2 o'clock position left breast 6 cm from the nipple, in the region of the most prominent palpable area of concern. #2: I suggest biopsying a discrete hypoechoic nodule with vascular flow seen at 2 o'clock position 10 cm from the nipple. A additionally, please note that disease appears to extend toward the nipple. Therefore, if pathology results are positive for malignancy, bilateral breast MRI is suggested. Magnification views of the right breast are suggested, and can be added to the patient's diagnostic mammogram from today. I have discussed the findings and recommendations with the patient. Results were also provided in writing at the conclusion of the visit. If applicable, a reminder letter will be sent to the patient regarding the next appointment. BI-RADS CATEGORY  4: Suspicious. Electronically Signed: By: Curlene Dolphin M.D. On: 01/27/2018 16:53   Mm Clip Placement Left  Result Date: 01/31/2018 CLINICAL DATA:  Post 2 ultrasound-guided core needle biopsy of  left breast. EXAM: DIAGNOSTIC LEFT MAMMOGRAM POST ULTRASOUND BIOPSY COMPARISON:  Previous exam(s). FINDINGS: Mammographic images were obtained following ultrasound guided biopsy of left breast. Two-view mammography demonstrates presence of ribbon shaped marker at 2 o'clock 6 cm from the nipple in an area of focal asymmetry seen mammographically. This corresponds to the area of palpable concern. The heart shaped marker is located more posteriorly at 2 o'clock 10 cm from the nipple, again within an area of focal asymmetry. Expected post biopsy changes are seen. IMPRESSION: Successful placement of post biopsy markers, post 2 left breast core needle biopsies: Left breast 2 o'clock 6 cm from the nipple-ribbon shaped marker Left breast 2 o'clock 10 cm from the nipple-heart shaped marker. Final Assessment: Post Procedure Mammograms for Marker Placement Electronically Signed   By: Fidela Salisbury M.D.   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 This was found to be concordant by Dr. Fidela Salisbury. Pathology results were discussed with the patient by telephone. The patient reported doing well after the biopsy with tenderness at the site. Post biopsy instructions and care were reviewed and questions were answered. The patient was encouraged to call [facility] for any additional concerns. The patient was referred to [facility] on February 09, 2018. A breast MRI is recommended for LEFT breast bloody nipple discharge and extent  of disease. Pathology results reported by Roselind Messier, RN on 02/02/2018. Electronically Signed   By: Fidela Salisbury M.D.   On: 02/02/2018 13:00   Result Date: 02/03/2018 CLINICAL DATA:  Ultrasound-guided core needle biopsy of an area of palpable concern in the left breast 2 o'clock 6 cm from the nipple, and a nodule in the left breast 2 o'clock 10 cm from the nipple. EXAM: ULTRASOUND GUIDED LEFT BREAST CORE NEEDLE BIOPSY COMPARISON:  Previous exam(s). FINDINGS: I met with the patient and we discussed the procedure of ultrasound-guided biopsy, including benefits and alternatives. We discussed the high likelihood of a successful procedure. We discussed the risks of the procedure, including infection, bleeding, tissue injury, clip migration, and inadequate sampling. Informed written consent was given. The  usual time-out protocol was performed immediately prior to the procedure. Lesion quadrant: Upper outer quadrant Using sterile technique and 1% Lidocaine as local anesthetic, under direct ultrasound visualization, a 14 gauge spring-loaded device was used to perform biopsy of left breast palpable hypoechoic area/dilated ducts at 2 o'clock 6 cm from the nipple using a inferior approach. At the conclusion of the procedure a ribbon shaped tissue marker clip was deployed into the biopsy cavity. Next, using sterile technique and 1% Lidocaine as local anesthetic, under direct ultrasound visualization, a 14 gauge spring-loaded device was used to perform biopsy of left breast 2 o'clock 10 cm from the nipple nodule using a inferior approach. At the conclusion of the procedure a heart shaped tissue marker clip was deployed into the biopsy cavity. Follow up 2 view mammogram was performed and dictated separately. IMPRESSION: Ultrasound guided biopsy of left breast: 2 o'clock 6 cm from the nipple-ribbon shaped marker 2 o'clock 10 cm from the nipple-heart shaped marker. No apparent complications. Electronically Signed: By:  Fidela Salisbury M.D. 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 This was found to be concordant by Dr. Fidela Salisbury. Pathology results were discussed with the patient by telephone. The patient reported doing well after the biopsy with tenderness at the site. Post biopsy instructions and care were reviewed and questions were answered. The patient was encouraged to call [facility] for any additional concerns. The patient was referred to [facility] on February 09, 2018. A breast MRI is recommended for LEFT breast bloody nipple discharge and extent of disease. Pathology results reported by Roselind Messier, RN on 02/02/2018. Electronically Signed   By: Fidela Salisbury M.D.   On: 02/02/2018 13:00   Result Date: 02/03/2018 CLINICAL DATA:  Ultrasound-guided core needle biopsy of an area of palpable concern in the left breast 2 o'clock 6 cm from the nipple, and a nodule in the left breast 2 o'clock 10 cm from the nipple. EXAM: ULTRASOUND GUIDED LEFT BREAST CORE NEEDLE BIOPSY COMPARISON:  Previous exam(s). FINDINGS: I met with the patient and we discussed the procedure of ultrasound-guided biopsy, including benefits and alternatives. We discussed the high likelihood of a successful procedure. We discussed the risks of the procedure, including infection, bleeding, tissue injury, clip migration, and inadequate sampling. Informed written consent was given. The usual time-out protocol was performed immediately prior to the  procedure. Lesion quadrant: Upper outer quadrant Using sterile technique and 1% Lidocaine as local anesthetic, under direct ultrasound visualization, a 14 gauge spring-loaded device was used to perform biopsy of left breast palpable hypoechoic area/dilated ducts at 2 o'clock 6 cm from the nipple using a inferior approach. At the conclusion of the procedure a ribbon shaped tissue marker clip was deployed into the biopsy cavity. Next, using sterile technique and 1% Lidocaine as local anesthetic, under direct ultrasound visualization, a 14 gauge spring-loaded device was used to perform biopsy of left breast 2 o'clock 10 cm from the nipple nodule using a inferior approach. At the conclusion of the procedure a heart shaped tissue marker  clip was deployed into the biopsy cavity. Follow up 2 view mammogram was performed and dictated separately. IMPRESSION: Ultrasound guided biopsy of left breast: 2 o'clock 6 cm from the nipple-ribbon shaped marker 2 o'clock 10 cm from the nipple-heart shaped marker. No apparent complications. Electronically Signed: By: Fidela Salisbury M.D. 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 The pt doesn't qualify for COMET due to bloody discharge. The patient will be meeting with Dr. Excell Seltzer, it is doubtful that she will be a good breast conserving candidate due to the extension of disease noted on physical and imaging. The patient will likely require a mastectomy.   We discussed the general indication for post-mastectomy radiation being, margin status, tumor size, and lymph node positivity.     PLAN:  1. Genetics, 02/10/2018 2. Def surgery, most like mast and snl procedure 3. Radiation therapy depending on results from surgical  findings 4. Tamoxifen     ------------------------------------------------  Blair Promise, PhD, MD   This document serves as a record of services personally performed by Gery Pray, MD. It was created on his behalf by [facility], a trained medical scribe. The creation of this record is based on the scribe's personal observations and the provider's statements to them. This document has been checked and approved by the attending provider.

## 2018-02-10 ENCOUNTER — Telehealth: Payer: Self-pay | Admitting: Student in an Organized Health Care Education/Training Program

## 2018-02-10 ENCOUNTER — Encounter: Payer: Self-pay | Admitting: Genetic Counselor

## 2018-02-10 ENCOUNTER — Inpatient Hospital Stay: Payer: Self-pay

## 2018-02-10 ENCOUNTER — Inpatient Hospital Stay (HOSPITAL_BASED_OUTPATIENT_CLINIC_OR_DEPARTMENT_OTHER): Payer: Self-pay | Admitting: Genetic Counselor

## 2018-02-10 DIAGNOSIS — D0512 Intraductal carcinoma in situ of left breast: Secondary | ICD-10-CM

## 2018-02-10 DIAGNOSIS — Z8041 Family history of malignant neoplasm of ovary: Secondary | ICD-10-CM | POA: Insufficient documentation

## 2018-02-10 DIAGNOSIS — Z803 Family history of malignant neoplasm of breast: Secondary | ICD-10-CM | POA: Insufficient documentation

## 2018-02-10 DIAGNOSIS — D500 Iron deficiency anemia secondary to blood loss (chronic): Secondary | ICD-10-CM

## 2018-02-10 LAB — CMP (CANCER CENTER ONLY)
ALT: 8 U/L (ref 0–55)
AST: 13 U/L (ref 5–34)
Albumin: 4.2 g/dL (ref 3.5–5.0)
Alkaline Phosphatase: 71 U/L (ref 40–150)
Anion gap: 9 (ref 3–11)
BILIRUBIN TOTAL: 0.6 mg/dL (ref 0.2–1.2)
BUN: 9 mg/dL (ref 7–26)
CHLORIDE: 106 mmol/L (ref 98–109)
CO2: 25 mmol/L (ref 22–29)
CREATININE: 0.84 mg/dL (ref 0.60–1.10)
Calcium: 9.9 mg/dL (ref 8.4–10.4)
Glucose, Bld: 97 mg/dL (ref 70–140)
Potassium: 3.9 mmol/L (ref 3.5–5.1)
Sodium: 140 mmol/L (ref 136–145)
TOTAL PROTEIN: 8.2 g/dL (ref 6.4–8.3)

## 2018-02-10 LAB — IRON AND TIBC
IRON: 33 ug/dL — AB (ref 41–142)
Saturation Ratios: 9 % — ABNORMAL LOW (ref 21–57)
TIBC: 360 ug/dL (ref 236–444)
UIBC: 327 ug/dL

## 2018-02-10 LAB — CBC WITH DIFFERENTIAL (CANCER CENTER ONLY)
BASOS ABS: 0 10*3/uL (ref 0.0–0.1)
BASOS PCT: 1 %
EOS ABS: 0 10*3/uL (ref 0.0–0.5)
Eosinophils Relative: 0 %
HCT: 39.2 % (ref 34.8–46.6)
Hemoglobin: 12.1 g/dL (ref 11.6–15.9)
LYMPHS ABS: 1.6 10*3/uL (ref 0.9–3.3)
Lymphocytes Relative: 41 %
MCH: 22.3 pg — AB (ref 25.1–34.0)
MCHC: 30.9 g/dL — ABNORMAL LOW (ref 31.5–36.0)
MCV: 72.3 fL — ABNORMAL LOW (ref 79.5–101.0)
Monocytes Absolute: 0.3 10*3/uL (ref 0.1–0.9)
Monocytes Relative: 8 %
NEUTROS PCT: 50 %
Neutro Abs: 1.9 10*3/uL (ref 1.5–6.5)
Platelet Count: 349 10*3/uL (ref 145–400)
RBC: 5.42 MIL/uL (ref 3.70–5.45)
RDW: 29.8 % — ABNORMAL HIGH (ref 11.2–14.5)
WBC: 3.8 10*3/uL — AB (ref 3.9–10.3)

## 2018-02-10 LAB — FERRITIN: FERRITIN: 15 ng/mL (ref 9–269)

## 2018-02-10 LAB — RESEARCH LABS

## 2018-02-10 NOTE — Telephone Encounter (Signed)
No LOS 4/24 °

## 2018-02-10 NOTE — Progress Notes (Signed)
[facility]      Initial Visit   Patient Name: Casey Baker Patient DOB: 07-09-1971 Patient Age: 47 y.o. Encounter Date: 02/10/2018  Referring Provider: Truitt Merle, MD  Reason for Visit: Evaluate for hereditary susceptibility to cancer    Assessment and Plan:  . Casey Baker's history is not highly suggestive of a hereditary predisposition to cancer because of the many unaffected family members she has. However, her own age at diagnosis (age 79), in combination with ovarian cancer in her maternal grandmother, warrants a genetics evaluation.   . Testing is recommended to determine whether she has a pathogenic mutation that will impact her decision for breast surgery as well as her screening and risk-reduction for future cancer. A negative result will be reassuring.  . Casey Baker wished to pursue genetic testing and a blood sample will be sent to [facility] for analysis. [facility]'s STAT breast panel was requested as it will impact surgical decisions. Results should be available in about 7-12 days. The 9 genes on this panel are ATM, BRCA1, BRCA2, CDH1, CHEK2, PALB2, PTEN, STK11, TP53. Once this test is complete, analysis of additional genes on a larger hereditary cancer panel will proceed. She will be called after each result is obtained.   Dr. Burr Medico was available for questions concerning this case. Total time spent by me in face-to-face counseling was approximately 30 minutes.   _____________________________________________________________________   History of Present Illness: Casey Baker, a 47 y.o. female, is being seen at the [facility] due to a personal and family history of cancer. She presents to clinic today to discuss the possibility of a hereditary predisposition to cancer and discuss whether genetic testing is warranted.  Casey Baker was recenlty diagnosed with breast cancer at the age of 37. She indicated that she may use  results of genetic testing to decide which surgery to have.   Oncology History   Cancer Staging Ductal carcinoma in situ (DCIS) of left breast Staging form: Breast, AJCC 8th Edition - Clinical stage from 02/09/2018: Stage 0 (cTis (DCIS), cN0, cM0, ER+, PR+) - Unsigned       Ductal carcinoma in situ (DCIS) of left breast   01/27/2018 Mammogram    IMPRESSION: Suspicious bloody left nipple discharge with palpable masses in the 2 o'clock axis of the left breast. Findings are suspicious for Malignancy. ADDENDUM: Magnification views of the retroareolar right breast were performed to evaluate calcifications seen on the patient's baseline mammogram. There are loosely grouped and scattered calcifications in the outer right breast, superior to and directly posterior to the nipple. These calcifications are rounded and smudgy in the CC projection, and many of them demonstrate layering on the 90 degree lateral magnification view, consistent with benign milk of calcium. No suspicious microcalcifications are seen in the right breast.      01/31/2018 Initial Biopsy    Diagnosis 01/31/18 1. 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 - NEGATIVE FOR INVASIVE CARCINOMA. 2. 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 - NEGATIVE FOR INVASIVE CARCINOMA.      01/31/2018 Receptors her2    Prognostic indicators significant for: ER, 100% positive and PR, 100% positive, both with strong staining intensity.       02/03/2018 Initial Diagnosis    Ductal carcinoma  in situ (DCIS) of left breast       Past Medical History:  Diagnosis Date  . Anemia   . Anxiety   . 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 Family history of breast cancer   . Family history of ovarian cancer   . Hypertension    . Uterine fibroid     Past Surgical History:  Procedure Laterality Date  . TUBAL LIGATION      Social History   Socioeconomic History  . Marital status: Single    Spouse name: Not on file  . Number of children: Not on file  . Years of education: Not on file  . Highest education level: Not on file  Occupational History  . Not on file  Social Needs  . Financial resource strain: Not on file  . Food insecurity:    Worry: Not on file    Inability: Not on file  . Transportation needs:    Medical: Not on file    Non-medical: Not on file  Tobacco Use  . Smoking status: Never Smoker  . Smokeless tobacco: Never Used  Substance and Sexual Activity  . Alcohol use: No  . Drug use: No  . Sexual activity: Yes  Lifestyle  . Physical activity:    Days per week: 2 days    Minutes per session: 50 min  . Stress: Very much  Relationships  . Social connections:    Talks on phone: More than three times a week    Gets together: More than three times a week    Attends religious service: More than 4 times per year    Active member of club or organization: No    Attends meetings of clubs or organizations: Never    Relationship status: Never married  Other Topics Concern  . Not on file  Social History Narrative  . Not on file     Family History:  During the visit, a 4-generation pedigree was obtained. Family tree will be scanned in the Media tab in Epic  Significant diagnoses include the following:  Family History  Problem Relation Age of Onset  . Hypertension Mother   . Ovarian cancer Maternal Grandmother        dx 38s; deceased 32s  . Breast cancer Cousin        dx 72s; currently 23s; daughter of matenral uncle  . Leukemia Maternal Aunt        currently 13    Additionally, Casey Baker has 3 daughters (ages 9, 61 and 61). She has one sister (age 3) as well as a maternal half-brother. Her mother (age 65) had a TAH/BSO in her 14s due to fibroid. Her mother has 3 sisters and  5 brothers. Her father died at 72. He has 3 brothers and a sister.  Casey Baker ancestry is African American. There is no known Jewish ancestry and no consanguinity.  Discussion: We reviewed the characteristics, features and inheritance patterns of hereditary cancer syndromes. We discussed her risk of harboring a mutation in the context of her personal and family history. We discussed the process of genetic testing, insurance coverage and implications of results: positive, negative and variant of unknown significance (VUS).    Ms. Kopplin questions were answered to her satisfaction today and she is welcome to call with any additional questions or concerns. Thank you for the referral and allowing [place] to share in the care of your patient.    Steele Berg, MS, CGC  Certified Genetic Counselor phone: [phone] [email or web address]

## 2018-02-11 ENCOUNTER — Other Ambulatory Visit: Payer: Self-pay

## 2018-02-14 ENCOUNTER — Telehealth: Payer: Self-pay

## 2018-02-14 NOTE — Telephone Encounter (Signed)
Patient called stating that she has a lot of questions regarding her treatment plan and would like for Dr. Burr Medico to call her.

## 2018-02-15 ENCOUNTER — Telehealth: Payer: Self-pay | Admitting: *Deleted

## 2018-02-15 ENCOUNTER — Telehealth: Payer: Self-pay | Admitting: Hematology

## 2018-02-15 NOTE — Telephone Encounter (Signed)
I returned patient's phone call, and answered to her all questions.  She is quite anxious, concerned about the possibility of invasive cancer and metastasis.  I reassured her that she has DCIS, noninvasive, which will not cause metastasis.  I reviewed her lab test results, her anemia has resolved since she started oral iron 3 to 4 weeks ago, she has slightly low white blood cell, with normal neutrophil count, I am not concerned.  This could be a normal variation.   I will refer her to SW for anxiety counseling.  Patient also requested an open MRI, I will ask our navigator to look into it.  Truitt Merle  02/15/2018

## 2018-02-15 NOTE — Telephone Encounter (Signed)
Spoke to pt concerning Baker from 02/11/18. Denies questions or concerns regarding dx or treatment care plan. Pt will see Dr. Iran Planas on 5/1 to discuss reconstruction. She will then call to discuss her final decision on sx. Encourage pt to call with needs. Received verbal understanding. Contact information provided.

## 2018-02-16 ENCOUNTER — Telehealth: Payer: Self-pay | Admitting: General Practice

## 2018-02-16 NOTE — Telephone Encounter (Signed)
[facility] CSW Progress Notes  Referral received from Ky Barban MD, requests CSW contact patient and explore need for support and resources.  Called patient, left VM requesting call back.  Edwyna Shell, LCSW Clinical Social Worker Phone:  [phone]

## 2018-02-17 ENCOUNTER — Ambulatory Visit: Payer: Self-pay | Admitting: Genetic Counselor

## 2018-02-17 ENCOUNTER — Telehealth: Payer: Self-pay | Admitting: General Practice

## 2018-02-17 NOTE — Telephone Encounter (Signed)
[facility] CSW Progress Note  Reached patient, states she is driving in heavy traffic.  Requests call back tomorrow.  Edwyna Shell, LCSW Clinical Social Worker Phone:  [phone]

## 2018-02-17 NOTE — Progress Notes (Signed)
[facility]            Result Disclosure        Patient Name: Casey Baker Patient DOB: [birth year] Patient Age: 47 y.o. Encounter Date: 02/17/2018  Referring Provider: Truitt Merle, MD  Reason for Call: Discuss results of genetic testing- 1st of 2 results   This is a brief note to document preliminary genetic test results.  [name] was called today to discuss the first of her genetic test results. Please see the Genetics note from her visit on 02/10/2018. Due to time contraints and needing actionable results for medical management, [facility]'s STAT Breast panel was ordered first.  Preliminary Test Results: Genetic testing involved analysis of 9 genes: ATM, BRCA1, BRCA2, CDH1, CHEK2, PALB2, PTEN, STK11 and TP53 genes. Testing was normal and did not reveal a mutation in these genes. Testing is in process for the remaining genes on the Breast/GYN panel.   Once results are obtained, [name] will be called again. She does not need to wait to proceed with surgery.  Steele Berg, MS, [place] Certified Genetic Counselor phone: [phone]

## 2018-02-18 ENCOUNTER — Telehealth: Payer: Self-pay | Admitting: General Practice

## 2018-02-18 NOTE — Telephone Encounter (Signed)
Casey Baker CSW Progress Note  Referral received from MD, concerned about patient's anxiety and coping skills needs.  CSW spoke w patient, "I am dong much better now, I was very overwhelmed at first."  Reports she has support from family and faith community.  Interested in additional resources through [employer] and [employer] - information packet mailed.  Encouraged to reach out to CSW as needed for help/resources.  Edwyna Shell, LCSW Clinical Social Worker Phone:  [phone]

## 2018-02-20 ENCOUNTER — Ambulatory Visit (HOSPITAL_COMMUNITY)
Admission: EM | Admit: 2018-02-20 | Discharge: 2018-02-20 | Disposition: A | Discharge status: Home / Self Care | Payer: Self-pay | Attending: Family Medicine | Admitting: Family Medicine

## 2018-02-20 ENCOUNTER — Encounter (HOSPITAL_COMMUNITY): Payer: Self-pay | Admitting: Emergency Medicine

## 2018-02-20 DIAGNOSIS — J3089 Other allergic rhinitis: Secondary | ICD-10-CM

## 2018-02-20 DIAGNOSIS — R0981 Nasal congestion: Secondary | ICD-10-CM

## 2018-02-20 MED ORDER — TRIAMCINOLONE ACETONIDE 55 MCG/ACT NA AERO: 2.0000 | INHALATION_SPRAY | Freq: Every day | NASAL | 12 refills | Status: DC
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
## 2018-02-20 NOTE — ED Triage Notes (Signed)
Pt c/o sinus symptoms x4 days, states shes been swallowing a lot of secretions.

## 2018-02-20 NOTE — Discharge Instructions (Signed)
Plenty of fluids. Use the nasal inhaler in both sides until symptoms clear Try to get enough rest, eat well  Follow-up with your primary care doctor.  I recommend [facility]  I will be here on Thursday and Friday if you have follow-up questions

## 2018-02-21 ENCOUNTER — Ambulatory Visit: Payer: Self-pay | Admitting: Genetic Counselor

## 2018-02-21 ENCOUNTER — Encounter: Payer: Self-pay | Admitting: Genetic Counselor

## 2018-02-21 DIAGNOSIS — Z1379 Encounter for other screening for genetic and chromosomal anomalies: Secondary | ICD-10-CM

## 2018-02-21 HISTORY — DX: Encounter for other screening for genetic and chromosomal anomalies: Z13.79

## 2018-02-21 NOTE — Progress Notes (Signed)
[facility]       Genetic Test Results    Patient Name: Casey Baker Patient DOB: 1970/12/20 Patient Age: 47 y.o. Encounter Date: 02/21/2018  Referring Provider: Truitt Merle, MD   Casey Baker was called today to discuss genetic test results. Please see the Genetics note from her visit on 02/10/2018 for a detailed discussion of her personal and family history.  Genetic Testing: At the time of Casey Baker's visit, she decided to pursue genetic testing of 9 genes that may be used to help guide treatment decisions. Once that test was completed, additional genes on a larger panel were analyzed. Testing which included sequencing and deletion/duplication analysis. Testing did not reveal a pathogenic mutation in any of the genes analyzed. A copy of the genetic test report will be scanned into Epic under the Media tab.  The genes analyzed were the 23 genes on [facility]'s Breast/GYN panel (ATM, BARD1, BRCA1, BRCA2, BRIP1, CDH1, CHEK2, DICER1, EPCAM, MLH1,  MSH2, MSH6, NBN, NF1, PALB2, PMS2, PTEN, RAD50, RAD51C, RAD51D,SMARCA4, STK11, and TP53).  Since the current test is not perfect, it is possible that there may be a gene mutation that current testing cannot detect, but that chance is small. It is possible that a different genetic factor, which has not yet been discovered or is not on this panel, is responsible for the cancer diagnoses in the family. Again, the likelihood of this is low. No additional testing is recommended at this time for Casey Baker.  Cancer Screening: These results suggest that Casey Baker's cancer was most likely not due to an inherited predisposition. Most cancers happen by chance and this test, along with details of her family history, suggests that her cancer falls into this category. She is recommended to follow the cancer screening guidelines provided by her physician.   Family Members: Given the young age of breast cancer in Casey Baker (age 53), women  are recommended to speak with their own providers about having a yearly mammogram beginning at age 41, which is 10 years earlier than the youngest breast cancer in the family and discuss with their own provider whether there is a benefit to adding tomosynthesis to the mammogram. Women are recommended to also have a yearly clinical breast exam, a yearly gynecologic exam and perform monthly breast self-exams. Colon cancer screening is recommended to begin by age 72 in both men and women, unless there is a family history of colon cancer or colon polyps or an individual has a personal history to warrant initiating screening at a younger age.  Any relative who had cancer at a young age or had a particularly rare cancer may also wish to pursue genetic testing. Genetic counselors can be located in other cities, by visiting the website of the [employer] ([employer]ist[employer]se) and Field seismologist for a Dietitian by zip code.   Lastly, cancer genetics is a rapidly advancing field and it is possible that new genetic tests will be appropriate for Casey Baker in the future. Casey Baker is encouraged to remain in contact with Genetics on an annual basis so we can update her personal and family histories, and let her know of advances in cancer genetics that may benefit the family. Casey Baker questions were answered to her satisfaction today, and she knows she is welcome to call anytime with additional questions.     Steele Berg, MS, [facility] Certified Genetic  Counselor phone: [phone]

## 2018-02-22 ENCOUNTER — Ambulatory Visit: Payer: Self-pay | Admitting: General Surgery

## 2018-02-22 DIAGNOSIS — D0512 Intraductal carcinoma in situ of left breast: Secondary | ICD-10-CM

## 2018-02-28 ENCOUNTER — Encounter (HOSPITAL_COMMUNITY): Payer: Self-pay | Admitting: *Deleted

## 2018-03-01 ENCOUNTER — Encounter: Payer: Self-pay | Admitting: Family Medicine

## 2018-03-01 NOTE — Progress Notes (Signed)
Patient did not keep appointment today. She will be called to reschedule.  

## 2018-03-02 NOTE — H&P (Signed)
Subjective:     Patient ID: Casey Baker is a 47 y.o. female.  HPI  Here for follow up discussion breast reconstruction. Presented with palpable mass and some bloody nipple discharge in January 2019.Obtained her first MMG revealed calcifications in the left breast and suspicious bloody discharge from the nipple with palpable masses in 2 o'clock position. Spans appr 8 cm over UOQ by [place]. Biopsies labeled left breast 2 o'clock, 6 cfn with low grade DCIS with intraductal papilloma, and left breast  2 o'clock, 10 cfn showedlow grade DCIS, ER/PR +.  MRI recommended, has not been scheduled in part due to financial concerns and claustrophobia. Given span of disease, Dr. Excell Seltzer has recommended mastectomy. Patient reports considering MRI now that insurance active. Genetics negative.  Current DDD. Wt stable  Not currently working.  Review of Systems    Objective:   Physical Exam  Cardiovascular: Normal rate, regular rhythm and normal heart sounds.   Pulmonary/Chest: Effort normal and breath sounds normal.  Abdominal: Soft.  +umbilical hernia Small to moderate volume soft tissue  grade 3 ptosis bilateral Right>left volume SN to nipple R 31 L 30 cm BW R 19 L 20 cm (CW Nipple to IMF R 14 L 13 cm Induration noted over left LOQ    Assessment:     DCIS left breast    Plan:     Plan left mastectomy with immediate expander, ADM reconstruction.   Given degree breast ptosis, recommend skin reduction pattern mastectomy. Reviewed anchor type scar. Reviewed  drains, OR length, hospital stay and post op visits, limitations. Discussed process of expansion and implant based risks including rupture, MRI surveillance for silicone implants, infection requiring surgery or removal, contracture. Discussed asymmetry one can expect with implant unilateral and natural breast on opposite. Discussed future surgery dependent on adjuvant treatments.  Reviewed reconstruction will be asensate and not  stimulate.   Discussed use of acellular dermis in reconstruction, cadaveric source, incorporation over several weeks, risk that if has seroma or infection can act as additional nidus for infection if not incorporated.Discussed prepectoral vs sub pectoral reconstruction. Discussed with patient and benefit of this is no animation deformity, may be less pain. Risk may be more visible rippling over upper poles, greater need of ADM. Reviewed pre pectoral would require larger amount acellular dermis, more drains. Plan prepectoral placement.  With regards to MRI, recommend she discuss this with Dr. Excell Seltzer and [facility]. Reviewed my opinion mastectomy is good choice for her given span of disease. States she is an anxious person and asks that I not tell her so many of the risks. Counseled I need to inform her of these, but understand that this has been stressful.  Rx for [facility] [place] given.  [name], MD [facility] [phone], pin [phone]

## 2018-03-04 ENCOUNTER — Telehealth: Payer: Self-pay | Admitting: Hematology

## 2018-03-04 NOTE — Telephone Encounter (Signed)
Appointment scheduled per 5/17 sch msg/ patient notified

## 2018-03-07 ENCOUNTER — Ambulatory Visit: Payer: Medicaid Other | Attending: Internal Medicine | Admitting: Internal Medicine

## 2018-03-07 ENCOUNTER — Encounter: Payer: Self-pay | Admitting: Internal Medicine

## 2018-03-07 VITALS — BP 145/92 | HR 138 | Temp 98.8°F | Resp 16 | Ht 67.0 in | Wt 174.4 lb

## 2018-03-07 DIAGNOSIS — Z8041 Family history of malignant neoplasm of ovary: Secondary | ICD-10-CM | POA: Insufficient documentation

## 2018-03-07 DIAGNOSIS — Z170 Estrogen receptor positive status [ER+]: Secondary | ICD-10-CM | POA: Diagnosis not present

## 2018-03-07 DIAGNOSIS — Z806 Family history of leukemia: Secondary | ICD-10-CM | POA: Diagnosis not present

## 2018-03-07 DIAGNOSIS — D0512 Intraductal carcinoma in situ of left breast: Secondary | ICD-10-CM | POA: Diagnosis not present

## 2018-03-07 DIAGNOSIS — F418 Other specified anxiety disorders: Secondary | ICD-10-CM | POA: Insufficient documentation

## 2018-03-07 DIAGNOSIS — I10 Essential (primary) hypertension: Secondary | ICD-10-CM | POA: Diagnosis not present

## 2018-03-07 DIAGNOSIS — Z818 Family history of other mental and behavioral disorders: Secondary | ICD-10-CM | POA: Diagnosis not present

## 2018-03-07 DIAGNOSIS — Z9851 Tubal ligation status: Secondary | ICD-10-CM | POA: Insufficient documentation

## 2018-03-07 DIAGNOSIS — R232 Flushing: Secondary | ICD-10-CM | POA: Diagnosis not present

## 2018-03-07 DIAGNOSIS — N951 Menopausal and female climacteric states: Secondary | ICD-10-CM | POA: Diagnosis not present

## 2018-03-07 DIAGNOSIS — Z79899 Other long term (current) drug therapy: Secondary | ICD-10-CM | POA: Insufficient documentation

## 2018-03-07 DIAGNOSIS — C50912 Malignant neoplasm of unspecified site of left female breast: Secondary | ICD-10-CM | POA: Diagnosis not present

## 2018-03-07 DIAGNOSIS — Z803 Family history of malignant neoplasm of breast: Secondary | ICD-10-CM | POA: Insufficient documentation

## 2018-03-07 DIAGNOSIS — Z8249 Family history of ischemic heart disease and other diseases of the circulatory system: Secondary | ICD-10-CM | POA: Diagnosis not present

## 2018-03-07 DIAGNOSIS — D649 Anemia, unspecified: Secondary | ICD-10-CM | POA: Diagnosis not present

## 2018-03-07 MED ORDER — GABAPENTIN 300 MG PO CAPS: 300.0000 mg | ORAL_CAPSULE | Freq: Every day | ORAL | 3 refills | Status: DC

## 2018-03-07 MED ORDER — LISINOPRIL 10 MG PO TABS: 10.0000 mg | ORAL_TABLET | Freq: Every day | ORAL | 1 refills | Status: DC

## 2018-03-07 NOTE — Patient Instructions (Signed)
Start [employer]
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
## 2018-03-09 ENCOUNTER — Telehealth: Payer: Self-pay | Admitting: Internal Medicine

## 2018-03-09 NOTE — Telephone Encounter (Signed)
Patient called stating that she was prescribed Gabapentin to help her sleep. Patient states that the medication is not helping her. Patient states that she is dealing with breast cancer and is having trouble sleeping. Patient states she was told by her PCP that if the medication did not work she would prescribe her another Rx. [facility] [address]. Please f/u

## 2018-03-09 NOTE — Telephone Encounter (Signed)
Will forward to pcp

## 2018-03-10 MED ORDER — TRAZODONE HCL 50 MG PO TABS: 50.0000 mg | ORAL_TABLET | Freq: Every evening | ORAL | 3 refills | Status: DC | PRN

## 2018-03-10 NOTE — Telephone Encounter (Signed)
Patient called to see if PCP could prescribe her some medication to help her sleep since she is still awake around 1 am.  Please follow up

## 2018-03-11 ENCOUNTER — Encounter (HOSPITAL_BASED_OUTPATIENT_CLINIC_OR_DEPARTMENT_OTHER): Payer: Self-pay | Admitting: *Deleted

## 2018-03-11 ENCOUNTER — Other Ambulatory Visit: Payer: Self-pay

## 2018-03-11 NOTE — Telephone Encounter (Signed)
Contacted pt and made aware rx is sent to her preferred pharmacy

## 2018-03-15 ENCOUNTER — Encounter (HOSPITAL_BASED_OUTPATIENT_CLINIC_OR_DEPARTMENT_OTHER)
Admission: RE | Admit: 2018-03-15 | Discharge: 2018-03-15 | Disposition: A | Discharge status: Home / Self Care | Payer: Medicaid Other | Source: Ambulatory Visit | Attending: General Surgery | Admitting: General Surgery

## 2018-03-15 DIAGNOSIS — Z01810 Encounter for preprocedural cardiovascular examination: Secondary | ICD-10-CM | POA: Insufficient documentation

## 2018-03-15 NOTE — Progress Notes (Signed)
Pt given Ensure and instructed to drink  By 0430 day of surgery with teach back method by Mingo Amber RN.

## 2018-03-17 NOTE — H&P (Addendum)
History of Present Illness  The patient is a 47 year old female who presents with breast cancer. She is a postmenopausal female referred by Dr. Jetta Lout for evaluation of recently diagnosed carcinoma of the left breast. She recently presented to the [facility] for evaluation with a 4 month history of intermittent left nipple discharge and a 3 month history of a palpable mass in the upper outer left breast. Subsequent imaging included diagnostic mamogram showing asymmetry with possible distortion at the site of the palpable abnormality and extending posterior to this in the far lateral left breast are multiple nearly coalescent small nodules and extending anteriorly is a tubular structure, possible dilated duct. Ultrasound was obtained showing hypo-echogenicity and probable dilated ducts in the 2:00 region 6 cm from the nipple in the area of palpable concern, and dilated ducts and hypoechoiasnodularity extended further peripherally as far as 10 cm from the nipple in the 2:00 position where there are discrete hypoechoic masses, one measuring 0.6 cm. Also noted was a discrete nodule at the 2:00 position 3 cm from the nipple measuring 0.6 cm. Axillary ultrasound was negative. 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 She is seen now in breast multidisciplinary clinic for initial treatment planning. She has experienced a mass and nipple discharge as above. No skin changes or nipple crusting.. She does not have a personal history of any previous breast problems.  Findings at that time were the following: Tumor size: estimated at least 8 cm extent by ultrasound Tumor grade: low-grade- Estrogen Receptor: 100% positive Progesterone Receptor: 100% positive    Past  Surgical History  Breast Biopsy  Bilateral.  Medication History  Medications Reconciled  Social History  Caffeine use  Carbonated beverages, Tea. Illicit drug use  Remotely quit drug use. Tobacco use  Never smoker.  Family History  Depression  Sister. Diabetes Mellitus  Daughter. Hypertension  Mother.  Pregnancy / Birth History  Age at menarche  32 years. Age of menopause  110-49 Contraceptive History  Oral contraceptives. Gravida  5 Maternal age  74-20 Para  3  Other Problems ( Depression  Gastroesophageal Reflux Disease  High blood pressure     Review of Systems  General Not Present- Appetite Loss, Chills, Fatigue, Fever, Night Sweats, Weight Gain and Weight Loss. Cardiovascular Not Present- Chest Pain, Difficulty Breathing Lying Down, Leg Cramps, Palpitations, Rapid Heart Rate, Shortness of Breath and Swelling of Extremities. Gastrointestinal Not Present- Abdominal Pain, Bloating, Bloody Stool, Change in Bowel Habits, Chronic diarrhea, Constipation, Difficulty Swallowing, Excessive gas, Gets full quickly at meals, Hemorrhoids, Indigestion, Nausea, Rectal Pain and Vomiting. Female Genitourinary Not Present- Frequency, Nocturia, Painful Urination, Pelvic Pain and Urgency. Psychiatric Present- Anxiety, Depression and Frequent crying. Not Present- Bipolar, Change in Sleep Pattern and Fearful. Endocrine Not Present- Cold Intolerance, Excessive Hunger, Hair Changes, Heat Intolerance, Hot flashes and New Diabetes.   Physical Exam ( The physical exam findings are as follows: Note:General: Alert, pleasant mildly overweight African-American female, in no distress Skin: Warm and dry without rash or infection. HEENT: No palpable masses or thyromegaly. Sclera nonicteric. Pupils equal round and reactive. Lymph nodes: No cervical, supraclavicular, nodes palpable. Breasts: Large ptotic D cup breasts bilaterally. There is thickening and possibly discrete mass upper  outer left breast about 6 cm from the  nipple.No skin changes or nipple crusting. Lungs: Breath sounds clear and equal. No wheezing or increased work of breathing. Cardiovascular: Regular rate and rhythm without murmer. No JVD or edema. Peripheral pulses intact. Abdomen: Nondistended. Soft and nontender. No masses palpable. No organomegaly. No palpable hernias. Extremities: No edema or joint swelling or deformity. No chronic venous stasis changes. Neurologic: Alert and fully oriented. Gait normal. No focal weakness. Psychiatric: Normal mood and affect. Thought content appropriate with normal judgement and insight    Assessment & Plan  DUCTAL CARCINOMA IN SITU (DCIS) OF LEFT BREAST (D05.12) Impression: 47 year old female with a new diagnosis of low-grade DCIS of the left breast, upper outer quadrant. Clinical stage 0, ER positive, PR positive, at least 7-8 cm extent of tumor by ultrasound. I discussed with the patient today initial surgical treatment options. We discussed options of breast conservation with lumpectomy or total mastectomy and sentinal lymph node biopsy/dissection. Options for reconstruction were discussed. patient strongly opposes a mastectomy if there are any other options. I'm concerned that mastectf y m disease. best approach based on the extent of her disease. However she has large ptotic breasts and we could possibly do a large lumpectomy in conjunction with bilateral breast reduction. We discussed obtaining an MRI to more fully evaluate extent of disease. However the patient's current insurance does not cover open MRI and she feels she is completely unable to have a closed MRI so we will not be able to obtain this study. We have referred to plastic surgery to discuss reconstruction options and we can also discuss potential extended lumpectomy with bilateral breast reduction although I am somewhat less enthusiastic about this approach without an MRI for evaluation. At the end of the  discussion which was likely patient had an opportunity to have all her questions answered. she is considering the possibility of no surgery at all with hormone therapy and follow-up. We discussed that this is not standard treatment and unfortunately due to her mass and symptoms she would not be a candidate for the COMET trial. I will plan to call the patient after her plastic surgery consultation to discuss further plans. I would need to see her in the office for face-to-face discussion and exam prior to any surgery. Current Plans:  Patient has subsequently been referred to Dr. Iran Planas and skin reduction pattern mastectomy with sentinel lymph node biopsy has been planned.

## 2018-03-17 NOTE — Anesthesia Preprocedure Evaluation (Addendum)
Anesthesia Evaluation  Patient identified by MRN, date of birth, ID band Patient awake    Reviewed: Allergy & Precautions, NPO status , Patient's Chart, lab work & pertinent test results  Airway Mallampati: II  TM Distance: >3 FB Neck ROM: Full    Dental no notable dental hx. (+) Dental Advisory Given, Poor Dentition,    Pulmonary neg pulmonary ROS,    Pulmonary exam normal breath sounds clear to auscultation       Cardiovascular hypertension, Pt. on medications Normal cardiovascular exam Rhythm:Regular Rate:Normal     Neuro/Psych Anxiety negative neurological ROS     GI/Hepatic negative GI ROS, Neg liver ROS,   Endo/Other    Renal/GU negative Renal ROS     Musculoskeletal   Abdominal   Peds  Hematology  (+) anemia ,   Anesthesia Other Findings   Reproductive/Obstetrics                            Lab Results  Component Value Date   WBC 3.8 (L) 02/10/2018   HGB 12.1 02/10/2018   HCT 39.2 02/10/2018   MCV 72.3 (L) 02/10/2018   PLT 349 02/10/2018    Anesthesia Physical Anesthesia Plan  ASA: II  Anesthesia Plan: General and Regional   Post-op Pain Management:    Induction: Intravenous  PONV Risk Score and Plan: 3 and Treatment may vary due to age or medical condition, Scopolamine patch - Pre-op, Ondansetron and Dexamethasone  Airway Management Planned: LMA  Additional Equipment:   Intra-op Plan:   Post-operative Plan:   Informed Consent: I have reviewed the patients History and Physical, chart, labs and discussed the procedure including the risks, benefits and alternatives for the proposed anesthesia with the patient or authorized representative who has indicated his/her understanding and acceptance.     Plan Discussed with: CRNA  Anesthesia Plan Comments:        Anesthesia Quick Evaluation

## 2018-03-18 ENCOUNTER — Encounter (HOSPITAL_BASED_OUTPATIENT_CLINIC_OR_DEPARTMENT_OTHER)
Admission: RE | Disposition: A | Discharge status: Home / Self Care | Payer: Self-pay | Source: Ambulatory Visit | Attending: General Surgery

## 2018-03-18 ENCOUNTER — Encounter (HOSPITAL_BASED_OUTPATIENT_CLINIC_OR_DEPARTMENT_OTHER): Payer: Self-pay | Admitting: Anesthesiology

## 2018-03-18 ENCOUNTER — Other Ambulatory Visit: Payer: Self-pay

## 2018-03-18 ENCOUNTER — Ambulatory Visit (HOSPITAL_BASED_OUTPATIENT_CLINIC_OR_DEPARTMENT_OTHER)
Admission: RE | Admit: 2018-03-18 | Discharge: 2018-03-19 | Disposition: A | Discharge status: Home / Self Care | Payer: Medicaid Other | Source: Ambulatory Visit | Attending: General Surgery | Admitting: General Surgery

## 2018-03-18 ENCOUNTER — Encounter (HOSPITAL_COMMUNITY)
Admission: RE | Admit: 2018-03-18 | Discharge: 2018-03-18 | Disposition: A | Discharge status: Home / Self Care | Payer: Medicaid Other | Source: Ambulatory Visit | Attending: General Surgery | Admitting: General Surgery

## 2018-03-18 ENCOUNTER — Ambulatory Visit (HOSPITAL_BASED_OUTPATIENT_CLINIC_OR_DEPARTMENT_OTHER): Payer: Medicaid Other | Admitting: Anesthesiology

## 2018-03-18 DIAGNOSIS — F329 Major depressive disorder, single episode, unspecified: Secondary | ICD-10-CM | POA: Diagnosis not present

## 2018-03-18 DIAGNOSIS — Z79899 Other long term (current) drug therapy: Secondary | ICD-10-CM | POA: Insufficient documentation

## 2018-03-18 DIAGNOSIS — Z170 Estrogen receptor positive status [ER+]: Secondary | ICD-10-CM | POA: Insufficient documentation

## 2018-03-18 DIAGNOSIS — D649 Anemia, unspecified: Secondary | ICD-10-CM | POA: Insufficient documentation

## 2018-03-18 DIAGNOSIS — C773 Secondary and unspecified malignant neoplasm of axilla and upper limb lymph nodes: Secondary | ICD-10-CM | POA: Insufficient documentation

## 2018-03-18 DIAGNOSIS — D0592 Unspecified type of carcinoma in situ of left breast: Secondary | ICD-10-CM | POA: Diagnosis present

## 2018-03-18 DIAGNOSIS — F419 Anxiety disorder, unspecified: Secondary | ICD-10-CM | POA: Diagnosis not present

## 2018-03-18 DIAGNOSIS — I10 Essential (primary) hypertension: Secondary | ICD-10-CM | POA: Diagnosis not present

## 2018-03-18 DIAGNOSIS — C50412 Malignant neoplasm of upper-outer quadrant of left female breast: Secondary | ICD-10-CM | POA: Diagnosis not present

## 2018-03-18 DIAGNOSIS — D0512 Intraductal carcinoma in situ of left breast: Secondary | ICD-10-CM

## 2018-03-18 DIAGNOSIS — C50912 Malignant neoplasm of unspecified site of left female breast: Secondary | ICD-10-CM | POA: Diagnosis present

## 2018-03-18 HISTORY — DX: Nausea with vomiting, unspecified: R11.2

## 2018-03-18 HISTORY — PX: MASTECTOMY W/ SENTINEL NODE BIOPSY: SHX2001

## 2018-03-18 HISTORY — DX: Other specified postprocedural states: Z98.890

## 2018-03-18 HISTORY — PX: BREAST RECONSTRUCTION WITH PLACEMENT OF TISSUE EXPANDER AND FLEX HD (ACELLULAR HYDRATED DERMIS): SHX6295

## 2018-03-18 SURGERY — MASTECTOMY WITH SENTINEL LYMPH NODE BIOPSY
Anesthesia: Regional | Site: Breast | Laterality: Left

## 2018-03-18 MED ORDER — TRAZODONE HCL 50 MG PO TABS: 50.0000 mg | ORAL_TABLET | Freq: Every evening | ORAL | Status: DC | PRN

## 2018-03-18 MED ORDER — LIDOCAINE 2% (20 MG/ML) 5 ML SYRINGE
INTRAMUSCULAR | Status: DC | PRN
  Administered 2018-03-18: 50 mg via INTRAVENOUS

## 2018-03-18 MED ORDER — HYDROMORPHONE HCL 1 MG/ML IJ SOLN
INTRAMUSCULAR | Status: AC
  Filled 2018-03-18: qty 0.5

## 2018-03-18 MED ORDER — METHOCARBAMOL 500 MG PO TABS
500.0000 mg | ORAL_TABLET | Freq: Three times a day (TID) | ORAL | Status: DC | PRN
  Administered 2018-03-18 – 2018-03-19 (×3): 500 mg via ORAL
  Filled 2018-03-18 (×3): qty 1

## 2018-03-18 MED ORDER — MIDAZOLAM HCL 2 MG/2ML IJ SOLN
INTRAMUSCULAR | Status: AC
  Filled 2018-03-18: qty 2

## 2018-03-18 MED ORDER — OXYCODONE HCL 5 MG PO TABS
5.0000 mg | ORAL_TABLET | ORAL | Status: DC | PRN
  Administered 2018-03-18 – 2018-03-19 (×3): 10 mg via ORAL
  Filled 2018-03-18 (×4): qty 2

## 2018-03-18 MED ORDER — GABAPENTIN 300 MG PO CAPS
300.0000 mg | ORAL_CAPSULE | ORAL | Status: AC
  Administered 2018-03-18: 300 mg via ORAL

## 2018-03-18 MED ORDER — SODIUM CHLORIDE 0.9 % IV SOLN
INTRAVENOUS | Status: DC | PRN
  Administered 2018-03-18: 1000 mL

## 2018-03-18 MED ORDER — EPHEDRINE SULFATE 50 MG/ML IJ SOLN
INTRAMUSCULAR | Status: DC | PRN
  Administered 2018-03-18: 10 mg via INTRAVENOUS

## 2018-03-18 MED ORDER — ONDANSETRON 4 MG PO TBDP: 4.0000 mg | ORAL_TABLET | Freq: Four times a day (QID) | ORAL | Status: DC | PRN

## 2018-03-18 MED ORDER — ROPIVACAINE HCL 5 MG/ML IJ SOLN
INTRAMUSCULAR | Status: DC | PRN
  Administered 2018-03-18: 30 mL

## 2018-03-18 MED ORDER — FENTANYL CITRATE (PF) 100 MCG/2ML IJ SOLN
50.0000 ug | INTRAMUSCULAR | Status: DC | PRN
  Administered 2018-03-18: 50 ug via INTRAVENOUS
  Administered 2018-03-18: 100 ug via INTRAVENOUS

## 2018-03-18 MED ORDER — GABAPENTIN 300 MG PO CAPS
300.0000 mg | ORAL_CAPSULE | Freq: Every day | ORAL | Status: DC
  Administered 2018-03-18: 300 mg via ORAL

## 2018-03-18 MED ORDER — ACETAMINOPHEN 500 MG PO TABS
ORAL_TABLET | ORAL | Status: AC
  Filled 2018-03-18: qty 2

## 2018-03-18 MED ORDER — PHENYLEPHRINE HCL 10 MG/ML IJ SOLN: INTRAVENOUS | Status: DC | PRN

## 2018-03-18 MED ORDER — FENTANYL CITRATE (PF) 100 MCG/2ML IJ SOLN
INTRAMUSCULAR | Status: AC
Start: 2018-03-18 — End: ?
  Filled 2018-03-18: qty 2

## 2018-03-18 MED ORDER — BACITRACIN ZINC 500 UNIT/GM EX OINT
TOPICAL_OINTMENT | CUTANEOUS | Status: AC
  Filled 2018-03-18: qty 0.9

## 2018-03-18 MED ORDER — CEFAZOLIN SODIUM-DEXTROSE 2-4 GM/100ML-% IV SOLN
INTRAVENOUS | Status: AC
  Filled 2018-03-18: qty 100

## 2018-03-18 MED ORDER — POVIDONE-IODINE 10 % EX SOLN
CUTANEOUS | Status: DC | PRN
  Administered 2018-03-18: 1 via TOPICAL

## 2018-03-18 MED ORDER — LISINOPRIL 10 MG PO TABS
10.0000 mg | ORAL_TABLET | Freq: Every day | ORAL | Status: DC
  Administered 2018-03-19: 10 mg via ORAL

## 2018-03-18 MED ORDER — GABAPENTIN 300 MG PO CAPS
ORAL_CAPSULE | ORAL | Status: AC
  Filled 2018-03-18: qty 2

## 2018-03-18 MED ORDER — TECHNETIUM TC 99M SULFUR COLLOID FILTERED
1.0000 | Freq: Once | INTRAVENOUS | Status: AC | PRN
Start: 2018-03-18 — End: 2018-03-18
  Administered 2018-03-18: 1 via INTRADERMAL

## 2018-03-18 MED ORDER — HYDROMORPHONE HCL 1 MG/ML IJ SOLN
0.5000 mg | INTRAMUSCULAR | Status: DC | PRN
  Administered 2018-03-18 (×2): 0.5 mg via INTRAVENOUS
  Filled 2018-03-18 (×2): qty 0.5

## 2018-03-18 MED ORDER — METHOCARBAMOL 500 MG PO TABS: 500.0000 mg | ORAL_TABLET | Freq: Three times a day (TID) | ORAL | 0 refills | Status: DC | PRN

## 2018-03-18 MED ORDER — BUPIVACAINE-EPINEPHRINE (PF) 0.5% -1:200000 IJ SOLN
INTRAMUSCULAR | Status: AC
  Filled 2018-03-18: qty 30

## 2018-03-18 MED ORDER — ACETAMINOPHEN 500 MG PO TABS
1000.0000 mg | ORAL_TABLET | ORAL | Status: AC
  Administered 2018-03-18: 1000 mg via ORAL

## 2018-03-18 MED ORDER — DEXMEDETOMIDINE HCL IN NACL 200 MCG/50ML IV SOLN
INTRAVENOUS | Status: AC
  Filled 2018-03-18: qty 50

## 2018-03-18 MED ORDER — PROPOFOL 10 MG/ML IV BOLUS
INTRAVENOUS | Status: DC | PRN
  Administered 2018-03-18: 180 mg via INTRAVENOUS

## 2018-03-18 MED ORDER — GABAPENTIN 300 MG PO CAPS
ORAL_CAPSULE | ORAL | Status: AC
  Filled 2018-03-18: qty 1

## 2018-03-18 MED ORDER — DEXAMETHASONE SODIUM PHOSPHATE 4 MG/ML IJ SOLN
INTRAMUSCULAR | Status: DC | PRN
  Administered 2018-03-18: 10 mg via INTRAVENOUS

## 2018-03-18 MED ORDER — HYDROCODONE-ACETAMINOPHEN 7.5-325 MG PO TABS: 1.0000 | ORAL_TABLET | Freq: Once | ORAL | Status: DC | PRN

## 2018-03-18 MED ORDER — FENTANYL CITRATE (PF) 100 MCG/2ML IJ SOLN
INTRAMUSCULAR | Status: AC
  Filled 2018-03-18: qty 2

## 2018-03-18 MED ORDER — CELECOXIB 200 MG PO CAPS
ORAL_CAPSULE | ORAL | Status: AC
Start: 2018-03-18 — End: ?
  Filled 2018-03-18: qty 1

## 2018-03-18 MED ORDER — PHENYLEPHRINE HCL 10 MG/ML IJ SOLN: INTRAMUSCULAR | Status: DC | PRN

## 2018-03-18 MED ORDER — SODIUM CHLORIDE 0.9 % IJ SOLN
INTRAMUSCULAR | Status: AC
  Filled 2018-03-18: qty 10

## 2018-03-18 MED ORDER — KETOROLAC TROMETHAMINE 30 MG/ML IJ SOLN
30.0000 mg | Freq: Three times a day (TID) | INTRAMUSCULAR | Status: AC
  Administered 2018-03-18 – 2018-03-19 (×3): 30 mg via INTRAVENOUS
  Filled 2018-03-18 (×3): qty 1

## 2018-03-18 MED ORDER — HYDROMORPHONE HCL 1 MG/ML IJ SOLN
0.2500 mg | INTRAMUSCULAR | Status: DC | PRN
  Administered 2018-03-18 (×2): 0.5 mg via INTRAVENOUS

## 2018-03-18 MED ORDER — METHYLENE BLUE 0.5 % INJ SOLN
INTRAVENOUS | Status: AC
  Filled 2018-03-18: qty 10

## 2018-03-18 MED ORDER — PHENYLEPHRINE HCL 10 MG/ML IJ SOLN
INTRAVENOUS | Status: DC | PRN
  Administered 2018-03-18: 50 ug/min via INTRAVENOUS

## 2018-03-18 MED ORDER — CEFAZOLIN SODIUM-DEXTROSE 2-4 GM/100ML-% IV SOLN
2.0000 g | INTRAVENOUS | Status: AC
  Administered 2018-03-18: 2 g via INTRAVENOUS

## 2018-03-18 MED ORDER — PHENYLEPHRINE HCL 10 MG/ML IJ SOLN
INTRAMUSCULAR | Status: AC
  Filled 2018-03-18: qty 1

## 2018-03-18 MED ORDER — CELECOXIB 200 MG PO CAPS
200.0000 mg | ORAL_CAPSULE | ORAL | Status: AC
  Administered 2018-03-18: 200 mg via ORAL

## 2018-03-18 MED ORDER — ONDANSETRON HCL 4 MG/2ML IJ SOLN
INTRAMUSCULAR | Status: DC | PRN
  Administered 2018-03-18: 4 mg via INTRAVENOUS

## 2018-03-18 MED ORDER — SULFAMETHOXAZOLE-TRIMETHOPRIM 800-160 MG PO TABS: 1.0000 | ORAL_TABLET | Freq: Two times a day (BID) | ORAL | 0 refills | Status: DC

## 2018-03-18 MED ORDER — MEPERIDINE HCL 25 MG/ML IJ SOLN: 6.2500 mg | INTRAMUSCULAR | Status: DC | PRN

## 2018-03-18 MED ORDER — EPHEDRINE SULFATE 50 MG/ML IJ SOLN
INTRAMUSCULAR | Status: AC
  Filled 2018-03-18: qty 1

## 2018-03-18 MED ORDER — CEFAZOLIN SODIUM-DEXTROSE 1-4 GM/50ML-% IV SOLN
1.0000 g | Freq: Three times a day (TID) | INTRAVENOUS | Status: AC
  Administered 2018-03-18 – 2018-03-19 (×3): 1 g via INTRAVENOUS
  Filled 2018-03-18 (×3): qty 50

## 2018-03-18 MED ORDER — MIDAZOLAM HCL 2 MG/2ML IJ SOLN
1.0000 mg | INTRAMUSCULAR | Status: DC | PRN
  Administered 2018-03-18: 2 mg via INTRAVENOUS

## 2018-03-18 MED ORDER — OXYCODONE HCL 5 MG PO TABS: 5.0000 mg | ORAL_TABLET | ORAL | 0 refills | Status: DC | PRN

## 2018-03-18 MED ORDER — SCOPOLAMINE 1 MG/3DAYS TD PT72: 1.0000 | MEDICATED_PATCH | Freq: Once | TRANSDERMAL | Status: DC | PRN

## 2018-03-18 MED ORDER — CHLORHEXIDINE GLUCONATE CLOTH 2 % EX PADS: 6.0000 | MEDICATED_PAD | Freq: Once | CUTANEOUS | Status: DC

## 2018-03-18 MED ORDER — ENOXAPARIN SODIUM 40 MG/0.4ML ~~LOC~~ SOLN
40.0000 mg | SUBCUTANEOUS | Status: DC
  Administered 2018-03-19: 40 mg via SUBCUTANEOUS
  Filled 2018-03-18: qty 0.4

## 2018-03-18 MED ORDER — ACETAMINOPHEN 10 MG/ML IV SOLN: 1000.0000 mg | Freq: Once | INTRAVENOUS | Status: DC | PRN

## 2018-03-18 MED ORDER — ONDANSETRON HCL 4 MG/2ML IJ SOLN
INTRAMUSCULAR | Status: AC
  Filled 2018-03-18: qty 2

## 2018-03-18 MED ORDER — LACTATED RINGERS IV SOLN
INTRAVENOUS | Status: DC
  Administered 2018-03-18: 08:00:00 via INTRAVENOUS

## 2018-03-18 MED ORDER — VENLAFAXINE HCL ER 37.5 MG PO CP24
37.5000 mg | ORAL_CAPSULE | Freq: Every day | ORAL | Status: DC
  Administered 2018-03-19: 37.5 mg via ORAL

## 2018-03-18 MED ORDER — DIPHENHYDRAMINE HCL 50 MG/ML IJ SOLN: 25.0000 mg | Freq: Four times a day (QID) | INTRAMUSCULAR | Status: DC | PRN

## 2018-03-18 MED ORDER — DEXAMETHASONE SODIUM PHOSPHATE 10 MG/ML IJ SOLN
INTRAMUSCULAR | Status: AC
  Filled 2018-03-18: qty 1

## 2018-03-18 MED ORDER — KCL IN DEXTROSE-NACL 20-5-0.45 MEQ/L-%-% IV SOLN
INTRAVENOUS | Status: DC
  Administered 2018-03-18: 13:00:00 via INTRAVENOUS
  Filled 2018-03-18: qty 1000

## 2018-03-18 MED ORDER — DIPHENHYDRAMINE HCL 25 MG PO CAPS: 25.0000 mg | ORAL_CAPSULE | Freq: Four times a day (QID) | ORAL | Status: DC | PRN

## 2018-03-18 MED ORDER — BUPIVACAINE-EPINEPHRINE (PF) 0.25% -1:200000 IJ SOLN
INTRAMUSCULAR | Status: AC
  Filled 2018-03-18: qty 30

## 2018-03-18 MED ORDER — ONDANSETRON HCL 4 MG/2ML IJ SOLN: 4.0000 mg | Freq: Four times a day (QID) | INTRAMUSCULAR | Status: DC | PRN

## 2018-03-18 MED ORDER — PROMETHAZINE HCL 25 MG/ML IJ SOLN: 6.2500 mg | INTRAMUSCULAR | Status: DC | PRN

## 2018-03-18 MED ORDER — DEXMEDETOMIDINE HCL 200 MCG/2ML IV SOLN
INTRAVENOUS | Status: DC | PRN
  Administered 2018-03-18: 40 ug via INTRAVENOUS

## 2018-03-18 MED ORDER — LIDOCAINE HCL (CARDIAC) PF 100 MG/5ML IV SOSY
PREFILLED_SYRINGE | INTRAVENOUS | Status: AC
  Filled 2018-03-18: qty 5

## 2018-03-18 SURGICAL SUPPLY — 107 items
ALLODERM 16X20 PERFORATED (Tissue) ×1 IMPLANT
ALLOGRAFT PERF 16X20 1.6+/-0.4 (Tissue) ×1 IMPLANT
APPLIER CLIP 11 MED OPEN (CLIP)
APPLIER CLIP 9.375 MED OPEN (MISCELLANEOUS)
BAG DECANTER FOR FLEXI CONT (MISCELLANEOUS) ×3 IMPLANT
BENZOIN TINCTURE PRP APPL 2/3 (GAUZE/BANDAGES/DRESSINGS) IMPLANT
BINDER BREAST 3XL (GAUZE/BANDAGES/DRESSINGS) IMPLANT
BINDER BREAST LRG (GAUZE/BANDAGES/DRESSINGS) IMPLANT
BINDER BREAST MEDIUM (GAUZE/BANDAGES/DRESSINGS) IMPLANT
BINDER BREAST XLRG (GAUZE/BANDAGES/DRESSINGS) IMPLANT
BINDER BREAST XXLRG (GAUZE/BANDAGES/DRESSINGS) ×2 IMPLANT
BIOPATCH RED 1 DISK 7.0 (GAUZE/BANDAGES/DRESSINGS) ×1 IMPLANT
BIOPATCH RED 1IN DISK 7.0MM (GAUZE/BANDAGES/DRESSINGS)
BLADE CLIPPER SURG (BLADE) IMPLANT
BLADE HEX COATED 2.75 (ELECTRODE) ×1 IMPLANT
BLADE SURG 10 STRL SS (BLADE) ×3 IMPLANT
BLADE SURG 15 STRL LF DISP TIS (BLADE) ×1 IMPLANT
BLADE SURG 15 STRL SS (BLADE) ×2
BNDG GAUZE ELAST 4 BULKY (GAUZE/BANDAGES/DRESSINGS) ×2 IMPLANT
CANISTER SUCT 1200ML W/VALVE (MISCELLANEOUS) ×3 IMPLANT
CHLORAPREP W/TINT 26ML (MISCELLANEOUS) ×5 IMPLANT
CLIP APPLIE 11 MED OPEN (CLIP) IMPLANT
CLIP APPLIE 9.375 MED OPEN (MISCELLANEOUS) IMPLANT
CLOSURE WOUND 1/2 X4 (GAUZE/BANDAGES/DRESSINGS)
COVER BACK TABLE 60X90IN (DRAPES) ×3 IMPLANT
COVER MAYO STAND STRL (DRAPES) ×5 IMPLANT
COVER PROBE W GEL 5X96 (DRAPES) ×3 IMPLANT
DECANTER SPIKE VIAL GLASS SM (MISCELLANEOUS) IMPLANT
DERMABOND ADVANCED (GAUZE/BANDAGES/DRESSINGS) ×2
DERMABOND ADVANCED .7 DNX12 (GAUZE/BANDAGES/DRESSINGS) ×1 IMPLANT
DEVICE DISSECT PLASMABLAD 3.0S (MISCELLANEOUS) IMPLANT
DEVICE DUBIN W/COMP PLATE 8390 (MISCELLANEOUS) IMPLANT
DRAIN CHANNEL 15F RND FF W/TCR (WOUND CARE) ×3 IMPLANT
DRAIN CHANNEL 19F RND (DRAIN) ×3 IMPLANT
DRAIN HEMOVAC 1/8 X 5 (WOUND CARE) IMPLANT
DRAPE LAPAROSCOPIC ABDOMINAL (DRAPES) ×1 IMPLANT
DRAPE SURG 17X23 STRL (DRAPES) ×12 IMPLANT
DRAPE TOP ARMCOVERS (MISCELLANEOUS) ×3 IMPLANT
DRAPE U-SHAPE 76X120 STRL (DRAPES) ×3 IMPLANT
DRAPE UTILITY XL STRL (DRAPES) ×3 IMPLANT
DRSG PAD ABDOMINAL 8X10 ST (GAUZE/BANDAGES/DRESSINGS) ×6 IMPLANT
DRSG TEGADERM 4X10 (GAUZE/BANDAGES/DRESSINGS) IMPLANT
DRSG TEGADERM 4X4.75 (GAUZE/BANDAGES/DRESSINGS) IMPLANT
ELECT BLADE 4.0 EZ CLEAN MEGAD (MISCELLANEOUS) ×3
ELECT COATED BLADE 2.86 ST (ELECTRODE) ×3 IMPLANT
ELECT REM PT RETURN 9FT ADLT (ELECTROSURGICAL) ×3
ELECTRODE BLDE 4.0 EZ CLN MEGD (MISCELLANEOUS) ×1 IMPLANT
ELECTRODE REM PT RTRN 9FT ADLT (ELECTROSURGICAL) ×1 IMPLANT
EVACUATOR SILICONE 100CC (DRAIN) ×5 IMPLANT
EXPANDER BREAST TISSUE 500CC (Breast) ×2 IMPLANT
GAUZE SPONGE 4X4 12PLY STRL (GAUZE/BANDAGES/DRESSINGS) ×1 IMPLANT
GAUZE VASELINE 3X9 (GAUZE/BANDAGES/DRESSINGS) IMPLANT
GLOVE BIO SURGEON STRL SZ 6 (GLOVE) ×6 IMPLANT
GLOVE BIOGEL PI IND STRL 8 (GLOVE) ×1 IMPLANT
GLOVE BIOGEL PI INDICATOR 8 (GLOVE) ×2
GLOVE ECLIPSE 7.5 STRL STRAW (GLOVE) ×3 IMPLANT
GOWN STRL REUS W/ TWL LRG LVL3 (GOWN DISPOSABLE) ×2 IMPLANT
GOWN STRL REUS W/ TWL XL LVL3 (GOWN DISPOSABLE) ×1 IMPLANT
GOWN STRL REUS W/TWL LRG LVL3 (GOWN DISPOSABLE) ×6
GOWN STRL REUS W/TWL XL LVL3 (GOWN DISPOSABLE) ×2
IV NS 500ML (IV SOLUTION)
IV NS 500ML BAXH (IV SOLUTION) ×1 IMPLANT
KIT FILL SYSTEM UNIVERSAL (SET/KITS/TRAYS/PACK) ×3 IMPLANT
LIGHT WAVEGUIDE WIDE FLAT (MISCELLANEOUS) IMPLANT
MARKER SKIN DUAL TIP RULER LAB (MISCELLANEOUS) IMPLANT
NDL HYPO 25X1 1.5 SAFETY (NEEDLE) IMPLANT
NDL SAFETY ECLIPSE 18X1.5 (NEEDLE) IMPLANT
NEEDLE HYPO 18GX1.5 SHARP (NEEDLE)
NEEDLE HYPO 25X1 1.5 SAFETY (NEEDLE) IMPLANT
NS IRRIG 1000ML POUR BTL (IV SOLUTION) ×5 IMPLANT
PACK BASIN DAY SURGERY FS (CUSTOM PROCEDURE TRAY) ×3 IMPLANT
PENCIL BUTTON HOLSTER BLD 10FT (ELECTRODE) ×3 IMPLANT
PIN SAFETY STERILE (MISCELLANEOUS) ×3 IMPLANT
PLASMABLADE 3.0S (MISCELLANEOUS) ×3
PUNCH BIOPSY DERMAL 4MM (MISCELLANEOUS) IMPLANT
SHEET MEDIUM DRAPE 40X70 STRL (DRAPES) ×3 IMPLANT
SLEEVE SCD COMPRESS KNEE MED (MISCELLANEOUS) ×3 IMPLANT
SPONGE LAP 18X18 RF (DISPOSABLE) ×8 IMPLANT
SPONGE LAP 4X18 RFD (DISPOSABLE) IMPLANT
STAPLER VISISTAT 35W (STAPLE) ×3 IMPLANT
STRIP CLOSURE SKIN 1/2X4 (GAUZE/BANDAGES/DRESSINGS) IMPLANT
SUT CHROMIC 4 0 PS 2 18 (SUTURE) ×8 IMPLANT
SUT ETHILON 2 0 FS 18 (SUTURE) ×2 IMPLANT
SUT ETHILON 3 0 PS 1 (SUTURE) ×1 IMPLANT
SUT MNCRL AB 4-0 PS2 18 (SUTURE) ×3 IMPLANT
SUT MON AB 4-0 PC3 18 (SUTURE) ×2 IMPLANT
SUT PDS AB 2-0 CT2 27 (SUTURE) IMPLANT
SUT SILK 2 0 SH (SUTURE) ×1 IMPLANT
SUT SILK 3 0 SH 30 (SUTURE) ×2 IMPLANT
SUT VIC AB 0 CT1 27 (SUTURE)
SUT VIC AB 0 CT1 27XBRD ANBCTR (SUTURE) IMPLANT
SUT VIC AB 0 SH 27 (SUTURE) IMPLANT
SUT VIC AB 3-0 SH 27 (SUTURE) ×4
SUT VIC AB 3-0 SH 27X BRD (SUTURE) ×1 IMPLANT
SUT VICRYL 0 CT-2 (SUTURE) ×6 IMPLANT
SUT VICRYL 3-0 CR8 SH (SUTURE) ×3 IMPLANT
SUT VICRYL 4-0 PS2 18IN ABS (SUTURE) ×3 IMPLANT
SUT VLOC 180 0 24IN GS25 (SUTURE) ×2 IMPLANT
SYR BULB IRRIGATION 50ML (SYRINGE) ×5 IMPLANT
SYR CONTROL 10ML LL (SYRINGE) IMPLANT
TAPE MEASURE VINYL STERILE (MISCELLANEOUS) ×1 IMPLANT
TOWEL GREEN STERILE FF (TOWEL DISPOSABLE) ×4 IMPLANT
TOWEL OR NON WOVEN STRL DISP B (DISPOSABLE) IMPLANT
TUBE CONNECTING 20'X1/4 (TUBING) ×2
TUBE CONNECTING 20X1/4 (TUBING) ×3 IMPLANT
UNDERPAD 30X30 (UNDERPADS AND DIAPERS) ×6 IMPLANT
YANKAUER SUCT BULB TIP NO VENT (SUCTIONS) ×3 IMPLANT

## 2018-03-18 NOTE — Progress Notes (Signed)
Assisted Dr. Valma Cava with left, ultrasound guided, pectoralis block. Side rails up, monitors on throughout procedure. See vital signs in flow sheet. Tolerated Procedure well.

## 2018-03-18 NOTE — Op Note (Signed)
First Assist Op Note: [facility] I assisted the Surgeon(s) ___Dr. Arnoldo Hooker Thimmappa_____ on the procedure(s): _Immediate Left breast reconstruction with tissue expander and Alloderm________on Date __5/31/2019_______  I provided my assistance on this case as follows:  I was present and acted as first Environmental consultant during this operation. I was present during the patient transport into the operative suite and assisted the OR staff with transferring and positioning of the patient. All extremities were checked and properly cushioned and safety straps in place. I was involved in the prepping and placement of sterile drapes. A time out was performed and all information confirmed to be correct.  I first assisted during the case including retraction for exposure, assisting with closure of surgical wounds and application of sterile dressings. I provided assistance with application of post operative garments/splinting and assisted with patient transfer back to the stretcher as needed.   Rilee Knoll,PA-C Plastic Surgery [phone]

## 2018-03-18 NOTE — Op Note (Signed)
Preoperative Diagnosis: DCIS LEFT BREAST CANCER  Postoprative Diagnosis: DCIS LEFT BREAST CANCER  Procedure: Procedure(s): TOTAL LEFT MASTECTOMY WITH AXILLARY SENTINEL LYMPH NODE BIOPSY    Surgeon: Excell Seltzer T   Assistants: [name]  Anesthesia:  General LMA anesthesia  Indications: 47 year old female with a new diagnosis of low-grade DCIS of the left breast, upper outer quadrant. Clinical stage 0, ER positive, PR positive, at least 7-8 cm extent of tumor by ultrasound.  After extensive preoperative work-up and discussion with multiple specialties we have elected to proceed with left total mastectomy and axillary sentinel lymph node biopsy to be followed by immediate reconstruction.   Procedure Detail: Patient was brought to the operating room, placed in the supine position on the operating table, and LMA general anesthesia induced.  She received preoperative IV antibiotics.  PAS were in place.  The entire anterior chest and breasts, axillae and upper arms were widely sterilely prepped and draped.  Patient timeout was performed and correct procedure verified.  An anchor type skin sparing incision which had been marked by Dr. Iran Planas was used.  Incision was made with the plasma blade.  Skin and subcutaneous flaps were then developed superiorly toward the clavicle, medially to the edge of the sternum, inferiorly to the inframammary crease and laterally out toward the anterior border of the latissimus which was identified and dissected.  The dissection was deepened down onto the chest wall circumferentially.  The breast was then reflected off the chest wall working medial to lateral with the plasma blade.  It was dissected off the lateral portion of the pectoralis major and the serratus.  Working out laterally the specimen was dissected up off the latissimus and continued up toward the axilla.  The lateral border of the pectoralis major was freed and the pectoralis minor identified  and clavipectoral fascia incised.  The specimen was dissected up to the low axilla.  At this point the neoprobe was used to identify 2 sentinel lymph nodes with markedly elevated counts.  These were soft and slightly enlarged.  They had counts of 700 and 400 respectively.  There was no other palpable adenopathy and no significant counts remaining in the axilla.  I then came across the low axilla with Kelly clamps and this was tied with 3-0 Vicryl.  The specimen was oriented and sent for permanent pathology.  Wound was thoroughly irrigated and complete hemostasis assured.    Findings: As above  Estimated Blood Loss:  less than 50 mL         Drains: Per Dr. Iran Planas  Blood Given: none          Specimens: #1 left total mastectomy   #2 left axillary sentinel lymph nodes X 2        Complications:  * No complications entered in OR log *         Disposition: PACU - hemodynamically stable.         Condition: stable

## 2018-03-18 NOTE — Anesthesia Procedure Notes (Signed)
Anesthesia Regional Block: Pectoralis block   Pre-Anesthetic Checklist: ,, timeout performed, Correct Patient, Correct Site, Correct Laterality, Correct Procedure, Correct Position, site marked, Risks and benefits discussed,  Surgical consent,  Pre-op evaluation,  At surgeon's request and post-op pain management  Laterality: Left  Prep: chloraprep       Needles:  Injection technique: Single-shot  Needle Type: Echogenic Needle     Needle Length: 9cm  Needle Gauge: 21     Additional Needles:   Narrative:  Start time: 03/18/2018 7:55 AM End time: 03/18/2018 8:06 AM Injection made incrementally with aspirations every 5 mL.  Performed by: Personally  Anesthesiologist: Barnet Glasgow, MD

## 2018-03-18 NOTE — Op Note (Signed)
Operative Note   DATE OF OPERATION: 5.31.19  LOCATION: [facility]-observation  SURGICAL DIVISION: Plastic Surgery  PREOPERATIVE DIAGNOSES:  Left breast DCIS  POSTOPERATIVE DIAGNOSES:  same  PROCEDURE:  1. Left breast reconstruction with tissue expander 2. Acellular dermis (Alloderm) for breast reconstruction 320cm2  SURGEON: [name] MD MBA  ASSISTANT: S Rayburn PAC  ANESTHESIA:  General.   EBL: 50 ml for entire procedure  COMPLICATIONS: None immediate.   INDICATIONS FOR PROCEDURE:  The patient, Casey Baker, is a 47 y.o. female born on 01/04/1971, is here for immediate prepectoral expander and acellular dermis reconstruction following skin reduction mastectomy.   FINDINGS: Natrelle 133FV-13-T 500 tissue expander placed, initial fill volume 420 ml air. SN [id number]  DESCRIPTION OF PROCEDURE:  The patient was marked with the patient in the preoperative area to mark sternal notch, chest midline, anterior axillary lines and inframammary folds. Patient was marked for skin reduction mastectomy with most superior portion nipple areola marked on breast meridian. Vertical limbs marked by breast displacement and set at 9 cm length. The patient was taken to the operating room. SCDs were placed and IV antibiotics were given. The patient's operative site was prepped and draped in a sterile fashion. A time out was performed and all information was confirmed to be correct. In supine position, the lateral limbs for resection marked and area over lower pole preserved as inferiorly based dermal pedicle. Skin de epithelialized in this area.I assisted in mastectomy and sentinel LN sampling with exposure and retraction.  Following completion of mastectomy, the cavity was irrigated with solution containing Ancef, gentamicin, and bacitracin. Hemostasis was ensured. A 19 Fr drain was placed in subcutaneous position laterally anda 15 Fr drain placed along inframammary fold. Eachsecured  to skin with 2-0 nylon. Cavity irrigated with Betadine.The tissue expander was prepared on back table prior in insertion. The expander was filled with air to 420  ml. Acellular dermis was perforated and draped over anterior surface expander. The ADM was then secured to itself over posterior surface of expander. Redundant folds acellular dermis excised so that the ADM lie flat without folds over air filled expander.The expander was secured to medial insertion pectoralisl with a 0 vicryl.The lateral tab also secured to pectoralis muscle with 0-vicryl. The ADM was secured to pectoralis muscle and chest wall along inferior border at inframammary fold with 0 V lock suture.Laterally the mastectomy flap over posterior axillary line was advanced anteriorly and the subcutaneous tissue and superficial fascia was secured to pectoralis muscle and acellular dermis with 0-vicryl. The inferiorly based dermal pedicle was redraped superiorly over expander and acellular dermis and secured to pectoralis with interrupted 0-vicryl. Skin closure completedwith 3-0 vicryl in fascial layer and 4-0 vicryl in dermis. Skin closure completed with 4-0 monocryl subcuticular and tissue adhesive.  Tegaderms applied over skin flaps. Dry dressing and breast binder applied. The patient was allowed to wake from anesthesia, extubated and taken to the recovery room in satisfactory condition.   SPECIMENS: none  DRAINS: 15 and 19 Fr JP in left breast reconstruction  [name], MD [facility] Plastic & Reconstructive Surgery [phone], pin [phone]

## 2018-03-18 NOTE — Interval H&P Note (Signed)
History and Physical Interval Note:  03/18/2018 6:49 AM  Casey Baker  has presented today for surgery, with the diagnosis of DCIS LEFT BREAST CANCER  The various methods of treatment have been discussed with the patient and family. After consideration of risks, benefits and other options for treatment, the patient has consented to  Left breast reconstruction with tissue expander acellular dermis as a surgical intervention .  The patient's history has been reviewed, patient examined, no change in status, stable for surgery.  I have reviewed the patient's chart and labs.  Questions were answered to the patient's satisfaction.     [name]

## 2018-03-18 NOTE — Transfer of Care (Signed)
Immediate Anesthesia Transfer of Care Note  Patient: Casey Baker  Procedure(s) Performed: TOTAL LEFT MASTECTOMY WITH AXILLARY SENTINEL LYMPH NODE BIOPSY (Left Breast) LEFT BREAST RECONSTRUCTION WITH PLACEMENT OF TISSUE EXPANDER AND FLEX HD (ACELLULAR HYDRATED DERMIS) (Left Breast)  Patient Location: PACU  Anesthesia Type:General  Level of Consciousness: awake and sedated  Airway & Oxygen Therapy: Patient Spontanous Breathing and Patient connected to face mask oxygen  Post-op Assessment: Report given to RN and Post -op Vital signs reviewed and stable  Post vital signs: Reviewed and stable  Last Vitals:  Vitals Value Taken Time  BP 132/82 03/18/2018 11:04 AM  Temp    Pulse 99 03/18/2018 11:05 AM  Resp 0 03/18/2018 11:05 AM  SpO2 100 % 03/18/2018 11:05 AM  Vitals shown include unvalidated device data.  Last Pain:  Vitals:   03/18/18 0730  TempSrc: Oral  PainSc: 0-No pain         Complications: No apparent anesthesia complications

## 2018-03-18 NOTE — Interval H&P Note (Signed)
History and Physical Interval Note:  03/18/2018 8:16 AM  Casey Baker  has presented today for surgery, with the diagnosis of DCIS LEFT BREAST CANCER  The various methods of treatment have been discussed with the patient and family. After consideration of risks, benefits and other options for treatment, the patient has consented to  Procedure(s): TOTAL LEFT MASTECTOMY WITH AXILLARY SENTINEL LYMPH NODE BIOPSY (Left) LEFT BREAST RECONSTRUCTION WITH PLACEMENT OF TISSUE EXPANDER AND FLEX HD (ACELLULAR HYDRATED DERMIS) (Left) as a surgical intervention .  The patient's history has been reviewed, patient examined, no change in status, stable for surgery.  I have reviewed the patient's chart and labs.  Questions were answered to the patient's satisfaction.     Darene Lamer [name]

## 2018-03-18 NOTE — Anesthesia Procedure Notes (Signed)
Procedure Name: LMA Insertion Performed by: Dechelle Attaway W, CRNA Pre-anesthesia Checklist: Patient identified, Emergency Drugs available, Suction available and Patient being monitored Patient Re-evaluated:Patient Re-evaluated prior to induction Oxygen Delivery Method: Circle system utilized Preoxygenation: Pre-oxygenation with 100% oxygen Induction Type: IV induction Ventilation: Mask ventilation without difficulty LMA: LMA inserted LMA Size: 4.0 Number of attempts: 1 Placement Confirmation: positive ETCO2 Tube secured with: Tape Dental Injury: Teeth and Oropharynx as per pre-operative assessment        

## 2018-03-19 DIAGNOSIS — C50412 Malignant neoplasm of upper-outer quadrant of left female breast: Secondary | ICD-10-CM | POA: Diagnosis not present

## 2018-03-19 NOTE — Discharge Instructions (Signed)

## 2018-03-20 NOTE — Anesthesia Postprocedure Evaluation (Signed)
Anesthesia Post Note  Patient: Casey Baker  Procedure(s) Performed: TOTAL LEFT MASTECTOMY WITH AXILLARY SENTINEL LYMPH NODE BIOPSY (Left Breast) LEFT BREAST RECONSTRUCTION WITH PLACEMENT OF TISSUE EXPANDER AND FLEX HD (ACELLULAR HYDRATED DERMIS) (Left Breast)     Patient location during evaluation: PACU Anesthesia Type: Regional and General Level of consciousness: awake and alert Pain management: pain level controlled Vital Signs Assessment: post-procedure vital signs reviewed and stable Respiratory status: spontaneous breathing, nonlabored ventilation, respiratory function stable and patient connected to nasal cannula oxygen Cardiovascular status: blood pressure returned to baseline and stable Postop Assessment: no apparent nausea or vomiting Anesthetic complications: no    Last Vitals:  Vitals:   03/19/18 0530 03/19/18 0600  BP:  117/78  Pulse: 80 79  Resp:  16  Temp:  36.8 C  SpO2: 98% 100%    Last Pain:  Vitals:   03/19/18 0845  TempSrc:   PainSc: 5                  Barnet Glasgow

## 2018-03-21 ENCOUNTER — Encounter (HOSPITAL_BASED_OUTPATIENT_CLINIC_OR_DEPARTMENT_OTHER): Payer: Self-pay | Admitting: General Surgery

## 2018-03-22 ENCOUNTER — Telehealth: Payer: Self-pay | Admitting: Internal Medicine

## 2018-03-22 DIAGNOSIS — D259 Leiomyoma of uterus, unspecified: Secondary | ICD-10-CM

## 2018-03-22 NOTE — Telephone Encounter (Signed)
Contacted pt to see why she is needing referral and pt states she has fibroids and she started spotting.

## 2018-03-22 NOTE — Telephone Encounter (Signed)
Patient called requesting a referral for a OBGYN.

## 2018-03-23 ENCOUNTER — Telehealth: Payer: Self-pay | Admitting: *Deleted

## 2018-03-23 NOTE — Telephone Encounter (Signed)
Pt called requesting to talk to Dr. Burr Medico.  Spoke with pt, and was informed that she had surgery this past Friday 03/18/18.  Pt stated Dr. Excell Seltzer called pt yesterday to discuss path results.  However, pt did not really understand what is going on.  Pt is very concerned and would like to talk to Dr. Burr Medico for further instructions. Pt's    Phone     [phone].

## 2018-03-25 NOTE — Progress Notes (Signed)
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 Findings are suspicious for Malignancy. ADDENDUM: Magnification views of the retroareolar right breast were performed to evaluate calcifications seen on the patient's baseline mammogram. There are loosely grouped and scattered calcifications in the outer right breast, superior to and directly posterior to the nipple. These calcifications are rounded and smudgy in the CC projection, and many of them demonstrate layering on the 90 degree lateral magnification view, consistent with benign milk of calcium. No suspicious microcalcifications are seen in the right breast.      01/31/2018 Initial Biopsy    Diagnosis 01/31/18 1. 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 - NEGATIVE FOR INVASIVE CARCINOMA. 2. 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 - NEGATIVE FOR INVASIVE CARCINOMA.      01/31/2018 Receptors her2    Prognostic indicators significant for: ER, 100%  positive and PR, 100% positive, both with strong staining intensity.       02/03/2018 Initial Diagnosis    Ductal carcinoma in situ (DCIS) of left breast      02/10/2018 Genetic Testing    Testing did not reveal a pathogenic mutation in any of the genes analyzed.A copy of the genetic test report will be scanned into Epic under the Media tab. The genes analyzed were the 23 genes on [facility]'s Breast/GYN panel (ATM, BARD1, BRCA1, BRCA2, BRIP1, CDH1, CHEK2, DICER1, EPCAM, MLH1,  MSH2, MSH6, NBN, NF1, PALB2, PMS2, PTEN, RAD50, RAD51C, RAD51D,SMARCA4, STK11, and TP53).  Genetic testing involved analysis of 9 genes: ATM, BRCA1, BRCA2, CDH1, CHEK2, PALB2, PTEN, STK11 and TP53 genes. Testing was normal and did not reveal a mutation in these genes.        03/18/2018 Surgery    TOTAL LEFT MASTECTOMY WITH AXILLARY SENTINEL LYMPH NODE BIOPSY and  LEFT BREAST RECONSTRUCTION WITH PLACEMENT OF TISSUE EXPANDER AND FLEX HD (ACELLULAR HYDRATED DERMIS) by Dr. HExcell Seltzerand Dr. [name] 03/18/18      03/18/2018 Pathology Results    Diagnosis 03/18/18 1. Lymph node, sentinel, biopsy, Left Axillary #1 - METASTATIC CARCINOMA IN ONE OF ONE LYMPH NODES (1/1). 2. Lymph node, sentinel, biopsy, Left Axillary #2 - ONE OF ONE LYMPH NODES NEGATIVE FOR CARCINOMA (0/1). 3. Breast, simple mastectomy, Left Total - MICROINVASIVE DUCTAL CARCINOMA ARISING IN A BACKGROUND OF LOW GRADE DUCTAL CARCINOMA IN SITU. - DUCTAL CARCINOMA IN SITU PARTIALLY INVOLVES AN INTRADUCTAL PAPILLOMA. - RESECTION MARGINS ARE NEGATIVE FOR INVASIVE CARCINOMA. - IN SITU CARCINOMA IS PRESENT AT THE ANTERIOR MARGIN BROADLY. - BIOPSY SITE. - SEE  ONCOLOGY TABLE.       03/18/2018 Receptors her2    Estrogen Receptor: 100%, POSITIVE, MODERATE-WEAK STAINING INTENSITY Progesterone Receptor: 100%, POSITIVE, STRONG STAINING INTENSITY Proliferation Marker Ki67: 1% HER2 - NEGATIVE      03/18/2018 Cancer Staging    Staging form: Breast, AJCC 8th  Edition - Pathologic stage from 03/18/2018: Stage IA (pT24m, pN1a, cM0, G1, ER+, PR+, HER2-) - Signed by [name] on 03/28/2018       HISTORY OF PRESENTING ILLNESS:  [name]89y.o. 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 The patient was referred by her PCP Dr. [name] The patient presents to the clinic today accompanied by her family member.  Prior to pt's abnormal mammogram, she felt the mass herself and noticed some bloody nipple discharge in January 2019. She states that was her first mammogram. Since the biopsy, she reports the nipple discharge has stopped.   Pt's diagnostic mammogram from 01/27/18 revealed calcifications in the left breast and suspicious bloody discharge from the nippy with palpable masses in 2 o'clock position. Her initial biopsy confirmed low grade DCIS partially involving an intraductal papilloma.  Prognostic indicators significant for: ER, 100% positive and PR, 100% positive, both with strong staining intensity.  She reports her last period was in Dec 2018 and she has starting experiencing hot flashes. She noticed to prior to her last period that it was getting lighter and more irregular.  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 She states she has HTN but has never been started on medication. She reports she has mood swings and she was prescribed Paxil but she has not tried it yet. She has a surgical history of breast tissue excision in her early 20's.   GYN HISTORY  Menarchal: 11-12 LMP: Dec 2018 Contraceptive: none  HRT: n/a  G6P3: first pregnancy at age 47 She has a FMHx of Ovarian CA by her grandmother and Breast CA by her cousin. She denies tobacco use and alcohol use.   [place]     INTERVAL HISTORY  Casey HOLDRENis here for a follow up post left mastectomy. She presents to the clinic today accompanied by her  mother. She notes her surgery went well. She notes she has been depressed due to her change in her diagnosis but after speaking with her physicians she feels better. She plans to see her GYN about removing her fibroids. She plans to have dental work for total teeth extraction and get dentures placed in the near future.    On review of symptoms, pt notes insomnia.     MEDICAL HISTORY:  Past Medical History:  Diagnosis Date  . Anemia   . Anxiety   . 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 Family history of breast cancer   . Family history of ovarian cancer   . Genetic testing 02/21/2018   STAT Breast panel with reflex to Breast/GYN panel (23 genes) @ [facility] - No pathogenic mutations detected  . Hypertension   . PONV (postoperative nausea and vomiting)   . Uterine fibroid     SURGICAL HISTORY: Past Surgical History:  Procedure Laterality Date  . AXILLARY SURGERY    . BREAST RECONSTRUCTION WITH PLACEMENT OF TISSUE EXPANDER AND FLEX HD (ACELLULAR HYDRATED DERMIS) Left 03/18/2018   Procedure: LEFT BREAST RECONSTRUCTION WITH PLACEMENT OF TISSUE  EXPANDER AND FLEX HD (ACELLULAR HYDRATED DERMIS);  Surgeon: [name], MD;  Location: [facility];  Service: Plastics;  Laterality: Left;  . KNEE ARTHROSCOPY Left 2013  . MASTECTOMY W/ SENTINEL NODE BIOPSY Left 03/18/2018   Procedure: TOTAL LEFT MASTECTOMY WITH AXILLARY SENTINEL LYMPH NODE BIOPSY;  Surgeon: Excell Seltzer, MD;  Location: [facility];  Service: General;  Laterality: Left;  . TUBAL LIGATION      SOCIAL HISTORY: Social History   Socioeconomic History  . Marital status: Single    Spouse name: Not on file  . Number of children: Not on file  . Years of education: Not on file  . Highest education level: Not on file  Occupational History  . Not on file  Social Needs  . Financial resource strain: Not on file  . Food insecurity:    Worry: Not on file    Inability: Not  on file  . Transportation needs:    Medical: Not on file    Non-medical: Not on file  Tobacco Use  . Smoking status: Never Smoker  . Smokeless tobacco: Never Used  Substance and Sexual Activity  . Alcohol use: No  . Drug use: No  . Sexual activity: Yes  Lifestyle  . Physical activity:    Days per week: 2 days    Minutes per session: 50 min  . Stress: Very much  Relationships  . Social connections:    Talks on phone: More than three times a week    Gets together: More than three times a week    Attends religious service: More than 4 times per year    Active member of club or organization: No    Attends meetings of clubs or organizations: Never    Relationship status: Never married  . Intimate partner violence:    Fear of current or ex partner: No    Emotionally abused: No    Physically abused: No    Forced sexual activity: No  Other Topics Concern  . Not on file  Social History Narrative  . Not on file    FAMILY HISTORY: Family History  Problem Relation Age of Onset  . Hypertension Mother   . Ovarian cancer Maternal Grandmother        dx 42s; deceased 31s  . Breast cancer Cousin        dx 43s; currently 53s; daughter of matenral uncle  . Leukemia Maternal Aunt        currently 16    ALLERGIES:  has No Known Allergies.  MEDICATIONS:  Current Outpatient Medications  Medication Sig Dispense Refill  . ferrous sulfate 325 (65 FE) MG tablet Take 1 tablet (325 mg total) by mouth daily with breakfast. 30 tablet 3  . lisinopril (PRINIVIL,ZESTRIL) 10 MG tablet Take 1 tablet (10 mg total) by mouth daily. 90 tablet 1  . methocarbamol (ROBAXIN) 500 MG tablet Take 1 tablet (500 mg total) by mouth every 8 (eight) hours as needed for muscle spasms. 30 tablet 0  . Multiple Vitamins-Minerals (WOMENS MULTIVITAMIN PO) Take by mouth.    . venlafaxine XR (EFFEXOR-XR) 37.5 MG 24 hr capsule Take 1 capsule (37.5 mg total) by mouth daily with breakfast. 30 capsule 1  . gabapentin  (NEURONTIN) 300 MG capsule Take 1 capsule (300 mg total) by mouth at bedtime. (Patient not taking: Reported on 03/28/2018) 30 capsule 3  . oxyCODONE (ROXICODONE) 5 MG immediate release tablet Take 1-2 tablets (5-10 mg total) by mouth every 4 (  four) hours as needed. (Patient not taking: Reported on 03/28/2018) 40 tablet 0  . traZODone (DESYREL) 50 MG tablet Take 1 tablet (50 mg total) by mouth at bedtime as needed for sleep. (Patient not taking: Reported on 03/28/2018) 30 tablet 3  . triamcinolone (NASACORT) 55 MCG/ACT AERO nasal inhaler Place 2 sprays into the nose daily. (Patient not taking: Reported on 03/28/2018) 1 Inhaler 12  . zolpidem (AMBIEN) 5 MG tablet Take 1 tablet (5 mg total) by mouth at bedtime as needed for sleep. 30 tablet 0   No current facility-administered medications for this visit.     REVIEW OF SYSTEMS:   Constitutional: Denies fevers, chills or abnormal night sweats (+) hot flash (+) insomnia Eyes: Denies blurriness of vision, double vision or watery eyes Ears, nose, mouth, throat, and face: Denies mucositis or sore throat Respiratory: Denies cough, dyspnea or wheezes Cardiovascular: Denies palpitation, chest discomfort or lower extremity swelling Gastrointestinal:  Denies nausea, heartburn or change in bowel habits Skin: Denies abnormal skin rashes Lymphatics: Denies new lymphadenopathy or easy bruising Neurological:Denies numbness, tingling or new weaknesses Behavioral/Psych: Mood is stable, no new changes  All other systems were reviewed with the patient and are negative.  PHYSICAL EXAMINATION: ECOG PERFORMANCE STATUS: 0 - Asymptomatic  Vitals:   03/28/18 1312  BP: (!) 157/97  Pulse: (!) 138  Resp: 20  Temp: 98.6 F (37 C)  SpO2: 100%   Filed Weights   03/28/18 1312  Weight: 164 lb 6.4 oz (74.6 kg)    GENERAL:alert, no distress and comfortable SKIN: skin color, texture, turgor are normal, no rashes or significant lesions EYES: normal, conjunctiva are pink  and non-injected, sclera clear OROPHARYNX:no exudate, no erythema and lips, buccal mucosa, and tongue normal  NECK: supple, thyroid normal size, non-tender, without nodularity LYMPH:  no palpable lymphadenopathy in the cervical, axillary or inguinal LUNGS: clear to auscultation and percussion with normal breathing effort HEART: regular rate & rhythm and no murmurs and no lower extremity edema ABDOMEN:abdomen soft, non-tender and normal bowel sounds Musculoskeletal:no cyanosis of digits and no clubbing  PSYCH: alert & oriented x 3 with fluent speech NEURO: no focal motor/sensory deficits Breasts: (+) S/p left breast mastectomy with tissue expander placed: Surgical incision healing well   LABORATORY DATA:  I have reviewed the data as listed CBC Latest Ref Rng & Units 02/10/2018 12/08/2017 06/07/2015  WBC 3.9 - 10.3 K/uL 3.8(L) 5.0 -  Hemoglobin 11.6 - 15.9 g/dL 12.1 8.9(L) 12.2  Hematocrit 34.8 - 46.6 % 39.2 31.8(L) 36.0  Platelets 145 - 400 K/uL 349 406(H) -   CMP Latest Ref Rng & Units 02/10/2018 12/08/2017 06/07/2015  Glucose 70 - 140 mg/dL 97 112(H) 103(H)  BUN 7 - 26 mg/dL '9 6 7  ' Creatinine 0.60 - 1.10 mg/dL 0.84 0.87 0.80  Sodium 136 - 145 mmol/L 140 139 141  Potassium 3.5 - 5.1 mmol/L 3.9 4.3 3.7  Chloride 98 - 109 mmol/L 106 104 103  CO2 22 - 29 mmol/L 25 22 -  Calcium 8.4 - 10.4 mg/dL 9.9 9.1 -  Total Protein 6.4 - 8.3 g/dL 8.2 - -  Total Bilirubin 0.2 - 1.2 mg/dL 0.6 - -  Alkaline Phos 40 - 150 U/L 71 - -  AST 5 - 34 U/L 13 - -  ALT 0 - 55 U/L 8 - -   PATHOLOGY  Diagnosis 03/18/18 1. Lymph node, sentinel, biopsy, Left Axillary #1 - METASTATIC CARCINOMA IN ONE OF ONE LYMPH NODES (1/1). 2. Lymph node, sentinel,  biopsy, Left Axillary #2 - ONE OF ONE LYMPH NODES NEGATIVE FOR CARCINOMA (0/1). 3. Breast, simple mastectomy, Left Total - MICROINVASIVE DUCTAL CARCINOMA ARISING IN A BACKGROUND OF LOW GRADE DUCTAL CARCINOMA IN SITU. - DUCTAL CARCINOMA IN SITU PARTIALLY INVOLVES AN  INTRADUCTAL PAPILLOMA. - RESECTION MARGINS ARE NEGATIVE FOR INVASIVE CARCINOMA. - IN SITU CARCINOMA IS PRESENT AT THE ANTERIOR MARGIN BROADLY. - BIOPSY SITE. - SEE ONCOLOGY TABLE. Microscopic Comment 3. INVASIVE CARCINOMA OF THE BREAST: Resection Procedure: Left simple mastectomy with left axillary sentinel lymph node biopsies. Specimen Laterality: Left. Tumor Size: <1 mm. Histologic Type: Invasive ductal carcinoma. Histologic Grade: Glandular (Acinar)/Tubular Differentiation: 1 Nuclear Pleomorphism: 1 Mitotic Rate: 1 Overall Grade: 1 Ductal Carcinoma In Situ: Extensive, low grade. Partially involves intraductal papilloma. Tumor Extension: Confined to breast parenchyma. Margins: Distance from closest margin (millimeters): >10 mm. Specify closest margin (required only if <42m): N/A. DCIS Margins: Distance from closest margin (millimeters): Present at anterior margin broadly. Specify closest margin (required only if <132m: Anterior. Cannot be determined (explain): N/A. Regional Lymph Nodes: Number of Lymph Nodes Examined: 2 Number of Sentinel Nodes Examined (if applicable): 2 Number of Lymph Nodes with Macrometastases (>2 mm): 1 2 of 4 FINAL for Hovis, Katiejo L ([id number] Comment(continued) Number of Lymph Nodes with Micrometastases: 0 Number of Lymph Nodes with Isolated Tumor Cells (0.2 mm or 200 cells)#: 0 Size of Largest Metastatic Deposit (millimeters): 2 mm. Extranodal Extension: None. Treatment Effect: No known presurgical therapy Breast Biomarker Testing Performed on Previous Biopsy: Will be attempted on part #1. Testing Performed on Case Number: [id number] Receptor: Positive, 100% strong staining. Progesterone Receptor: Positive, 100% strong staining. HER2: N/A. Ki-67: N/A. Representative tumor block: 1A, 3C (limited). Pathologic Stage Classification (pTNM, AJCC 8th Edition): [id number] Comments: Immunohistochemistry is performed on  several blocks. There is focal lose of basal cell markers (calponin, smooth muscle myosin) corresponding to microinvasion (block 3C). (v4.2.0.0) 1. PROGNOSTIC INDICATORS Results: IMMUNOHISTOCHEMICAL AND MORPHOMETRIC ANALYSIS PERFORMED MANUALLY Estrogen Receptor: 100%, POSITIVE, MODERATE-WEAK STAINING INTENSITY Progesterone Receptor: 100%, POSITIVE, STRONG STAINING INTENSITY Proliferation Marker Ki67: 1% REFERENCE RANGE ESTROGEN RECEPTOR NEGATIVE 0% POSITIVE =>1% REFERENCE RANGE PROGESTERONE RECEPTOR NEGATIVE 0% POSITIVE =>1% 1. FLUORESCENCE IN-SITU HYBRIDIZATION Results: HER2 - NEGATIVE RATIO OF HER2/CEP17 SIGNALS 1.13 AVERAGE HER2 COPY NUMBER PER CELL 1.70 Reference Range: NEGATIVE HER2/CEP17 Ratio <2.0 and average HER2 copy number <4.0 EQUIVOCAL HER2/CEP17 Ratio <2.0 and average HER2 copy number >=4.0 and <6.0    Diagnosis 01/31/18 1. 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 - NEGATIVE FOR INVASIVE CARCINOMA. 2. 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 - NEGATIVE FOR INVASIVE CARCINOMA. 1 of 2 FINAL for EATTIAMARIE, [id number] Note 1. Dr. KisLyndon Codes reviewed this case and concurs with the above diagnosis. The [facility] notified on 02/01/18. A breast prognostic profile is pending and will be reported in an addendum. (NK:kh 02/01/18) Results: Estrogen Receptor: 100%, POSITIVE, STRONG STAINING INTENSITY Progesterone Receptor: 100%, POSITIVE, STRONG STAINING INTENSITY  RADIOGRAPHIC STUDIES: I have personally reviewed the radiological images as listed and agreed with the findings in the report.  Diagnostic Mammogram and [place]11/19 IMPRESSION: Suspicious bloody left nipple discharge with palpable masses in the 2 o'clock axis of the left  breast. Findings are suspicious for Malignancy. ADDENDUM: Magnification views of the retroareolar right breast were performed  to evaluate calcifications seen on the patient's baseline mammogram. There are loosely grouped and scattered calcifications in the outer right breast, superior to and directly posterior to the nipple. These calcifications are rounded and smudgy in the CC projection, and many of them demonstrate layering on the 90 degree lateral magnification view, consistent with benign milk of calcium. No suspicious microcalcifications are seen in the right breast.  Nm Sentinel Node Inj-no Rpt (breast)  Result Date: 03/18/2018 Sulfur colloid was injected by the nuclear medicine technologist for melanoma sentinel node.    ASSESSMENT & PLAN:  Casey Baker is 47 year old pre-menopausal woman, presented with screening discovered to DCIS.   1.  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 -She underwent left breast mastectomy and tissue expander placement on 03/18/18 -Her biopsy initially only showed DCIS in her left breast, her surgical pathology shows evidence of DCIS with microinvasion, low grade and 1 out of 2 positive lymph nodes. She had negative surgical margins, her tumor was completely resected.  -I recommend mammaprint testing on her positive lymph node for further risk stratification and guide possible adjuvant chemotherapy. She is agreeable.  -If her mammaprint returns low risk she will not need adjuvant chemotherapy. If her test returns high risk she would proceed with chemotherapy before radiation. Given her strongly ER/PR positive and low-grade disease, I have low suspicion chemotherapy is needed.  -She was previously seen by radiation oncologist Dr. Sondra Come. She will need post  mastectomy radiation given her positive lymph node.  -Given her family history of breast cancer and young age, genetic testing was recommended to her. Referral was made and her results were negative for gene mutations. Medically I do not recommend getting right breast mastectomy or BSO.  -Her right breast can be monitored with breast mammograms, MRIs and physical exams. She is considering hysterectomy due to her fibroids. I discussed she can get ovaries removed which will reduce her risk of breast cancer recurrence, however she is currently perimenopausal so there is little benefit from BSO at this point.  -Given her strongly ER and PR positivity of the tumor cells, and her pre-menopause status, I recommend adjuvant endocrine therapy with tamoxifen to prevent future breast cancer. The potential side effects were discussed with her in great details. She voiced good understanding, and she is interested. Plan to start after radiation.  -I recommend as she is healing from surgery she should maintain a healthy balanced diet and be more active. I also encouraged her to keep a positive mindset.  -F/u open based on her Mammaprint results   2. HTN   -I prescribed her 10 mg lisinopril on 02/10/18 and advised her to check her BP more regularly.   -BP elevated ay 157/97 today (03/28/18), I encouraged her to follow-up with her primary care physician to see if she needs high dose lisinopril.  3. 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 She takes oral iron daily and has had IV iron and blood transfusion in the past. Last transfusion was 2 years ago -Iron study from 02/10/18 showed evidence of iron deficiency with iron 33, sat 9.  -On 361m Ferrous Sulfate daily.  -Will monitor her labs while she is being followed by me and I will consider IV iron if needed.    4. Hot  Flashes, Mood disorder, insomnia  -Her last period was Dec 2018 and she has been having hot flashes  since.  -I previously switched her Paxil to Effexor 75 mg in 01/2018 due to drug interaction with Tamoxifen.  -I previously offered social work consult but she declined  -For her insomnia, I will prescribe her Ambien 26m to help her sleep. She can start at half tablet and increase if needed I discussed I will only give her 1 months supply. (03/28/18)  5. Financial Assistance  -She currently does not have insurance but she is applying for medicaid now. I previously discussed [place]'s financial assistance program with co pay. She stated she might be interested in the future.    No orders of the defined types were placed in this encounter.  PLAN:  -will send her positive node for mammaprint  -Prescribed Ambien [id number]  -f/u with PCP for her HTN  -F/u open.  If mammaprint shows low risk, she will proceed adjuvant radiation next, if high risk, I will see her back when the results returns.   All questions were answered. The patient knows to call the clinic with any problems, questions or concerns. I spent 25 minutes counseling the patient face to face. The total time spent in the appointment was 30 minutes and more than 50% was on counseling.  I,Oneal Deputyam acting as scribe for YaTruitt MerleMD.   I have reviewed the above documentation for accuracy and completeness, and I agree with the above.     YaTruitt MerleMD 03/28/2018

## 2018-03-28 ENCOUNTER — Telehealth: Payer: Self-pay | Admitting: *Deleted

## 2018-03-28 ENCOUNTER — Encounter: Payer: Self-pay | Admitting: Hematology

## 2018-03-28 ENCOUNTER — Inpatient Hospital Stay: Payer: Medicaid Other | Attending: Hematology | Admitting: Hematology

## 2018-03-28 ENCOUNTER — Telehealth: Payer: Self-pay | Admitting: Student in an Organized Health Care Education/Training Program

## 2018-03-28 VITALS — BP 157/97 | HR 138 | Temp 98.6°F | Resp 20 | Ht 67.0 in | Wt 164.4 lb

## 2018-03-28 DIAGNOSIS — Z79899 Other long term (current) drug therapy: Secondary | ICD-10-CM | POA: Insufficient documentation

## 2018-03-28 DIAGNOSIS — Z9012 Acquired absence of left breast and nipple: Secondary | ICD-10-CM

## 2018-03-28 DIAGNOSIS — D500 Iron deficiency anemia secondary to blood loss (chronic): Secondary | ICD-10-CM | POA: Insufficient documentation

## 2018-03-28 DIAGNOSIS — Z170 Estrogen receptor positive status [ER+]: Secondary | ICD-10-CM | POA: Insufficient documentation

## 2018-03-28 DIAGNOSIS — F39 Unspecified mood [affective] disorder: Secondary | ICD-10-CM | POA: Insufficient documentation

## 2018-03-28 DIAGNOSIS — D0512 Intraductal carcinoma in situ of left breast: Secondary | ICD-10-CM | POA: Diagnosis present

## 2018-03-28 DIAGNOSIS — N951 Menopausal and female climacteric states: Secondary | ICD-10-CM | POA: Diagnosis not present

## 2018-03-28 DIAGNOSIS — I10 Essential (primary) hypertension: Secondary | ICD-10-CM | POA: Diagnosis not present

## 2018-03-28 DIAGNOSIS — G4700 Insomnia, unspecified: Secondary | ICD-10-CM

## 2018-03-28 DIAGNOSIS — N920 Excessive and frequent menstruation with regular cycle: Secondary | ICD-10-CM | POA: Diagnosis not present

## 2018-03-28 DIAGNOSIS — C50412 Malignant neoplasm of upper-outer quadrant of left female breast: Secondary | ICD-10-CM

## 2018-03-28 MED ORDER — ZOLPIDEM TARTRATE 5 MG PO TABS: 5.0000 mg | ORAL_TABLET | Freq: Every evening | ORAL | 0 refills | Status: DC | PRN

## 2018-03-28 NOTE — Telephone Encounter (Signed)
NO LOS 6/10 °

## 2018-03-28 NOTE — Telephone Encounter (Signed)
Prior auth request for ambien placed in managed care bin for review today.

## 2018-03-29 ENCOUNTER — Telehealth: Payer: Self-pay

## 2018-03-29 ENCOUNTER — Telehealth: Payer: Self-pay | Admitting: *Deleted

## 2018-03-29 NOTE — Telephone Encounter (Signed)
Received order for mammaprint testing. Requisition faxed to pathology and [facility]. Received by Keisha.  

## 2018-03-29 NOTE — Telephone Encounter (Signed)
Per Dr. Burr Medico called patient to let her know due to her BP being high yesterday she needs to follow up with her PCP and may have to increase her Lisinopril.  Patient verbalized an understanding.

## 2018-03-29 NOTE — Telephone Encounter (Signed)
-----   Message from Truitt Merle, MD sent at 03/28/2018 10:25 PM EDT ----- Could you let pt know that her BP was high yesterday, and she may needs higher dose Lisinopril. Please f/u with pcp, thanks  Truitt Merle

## 2018-04-05 ENCOUNTER — Telehealth: Payer: Self-pay | Admitting: *Deleted

## 2018-04-05 ENCOUNTER — Encounter (HOSPITAL_COMMUNITY): Payer: Self-pay | Admitting: Hematology

## 2018-04-05 NOTE — Telephone Encounter (Signed)
Received documentation that there is insufficient tissue to run Mammaprint testing. Physician team notified.

## 2018-04-07 ENCOUNTER — Telehealth: Payer: Self-pay | Admitting: *Deleted

## 2018-04-07 NOTE — Telephone Encounter (Signed)
Scheduled and confirmed appt with Dr. Burr Medico on 6/25 at 1015 to discuss tx plan.

## 2018-04-12 ENCOUNTER — Encounter: Payer: Self-pay | Admitting: Hematology

## 2018-04-12 ENCOUNTER — Inpatient Hospital Stay (HOSPITAL_BASED_OUTPATIENT_CLINIC_OR_DEPARTMENT_OTHER): Payer: Medicaid Other | Admitting: Hematology

## 2018-04-12 ENCOUNTER — Encounter: Payer: Self-pay | Admitting: *Deleted

## 2018-04-12 VITALS — BP 133/79 | HR 116 | Temp 98.8°F | Resp 19 | Ht 67.0 in | Wt 167.1 lb

## 2018-04-12 DIAGNOSIS — G4700 Insomnia, unspecified: Secondary | ICD-10-CM

## 2018-04-12 DIAGNOSIS — D500 Iron deficiency anemia secondary to blood loss (chronic): Secondary | ICD-10-CM | POA: Diagnosis not present

## 2018-04-12 DIAGNOSIS — D0512 Intraductal carcinoma in situ of left breast: Secondary | ICD-10-CM

## 2018-04-12 DIAGNOSIS — C50412 Malignant neoplasm of upper-outer quadrant of left female breast: Secondary | ICD-10-CM

## 2018-04-12 DIAGNOSIS — Z9012 Acquired absence of left breast and nipple: Secondary | ICD-10-CM | POA: Diagnosis not present

## 2018-04-12 DIAGNOSIS — F39 Unspecified mood [affective] disorder: Secondary | ICD-10-CM | POA: Diagnosis not present

## 2018-04-12 DIAGNOSIS — Z170 Estrogen receptor positive status [ER+]: Secondary | ICD-10-CM

## 2018-04-12 DIAGNOSIS — Z79899 Other long term (current) drug therapy: Secondary | ICD-10-CM | POA: Diagnosis not present

## 2018-04-12 DIAGNOSIS — N920 Excessive and frequent menstruation with regular cycle: Secondary | ICD-10-CM | POA: Diagnosis not present

## 2018-04-12 DIAGNOSIS — I10 Essential (primary) hypertension: Secondary | ICD-10-CM

## 2018-04-12 DIAGNOSIS — N951 Menopausal and female climacteric states: Secondary | ICD-10-CM | POA: Diagnosis not present

## 2018-04-12 MED ORDER — VENLAFAXINE HCL ER 75 MG PO CP24: 75.0000 mg | ORAL_CAPSULE | Freq: Every day | ORAL | 2 refills | Status: DC

## 2018-04-12 MED ORDER — ESZOPICLONE 1 MG PO TABS: 1.0000 mg | ORAL_TABLET | Freq: Every evening | ORAL | 0 refills | Status: DC | PRN

## 2018-04-12 NOTE — Progress Notes (Signed)
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 Findings are suspicious for Malignancy. ADDENDUM: Magnification views of the retroareolar right breast were performed to evaluate calcifications seen on the patient's baseline mammogram. There are loosely grouped and scattered calcifications in the outer right breast, superior to and directly posterior to the nipple. These calcifications are rounded and smudgy in the CC projection, and many of them demonstrate layering on the 90 degree lateral magnification view, consistent with benign milk of calcium. No suspicious microcalcifications are seen in the right breast.      01/31/2018 Initial Biopsy    Diagnosis 01/31/18 1. 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 - NEGATIVE FOR INVASIVE CARCINOMA. 2. 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 - NEGATIVE FOR INVASIVE CARCINOMA.      01/31/2018 Receptors her2    Prognostic indicators significant for: ER,  100% positive and PR, 100% positive, both with strong staining intensity.       02/03/2018 Initial Diagnosis    Ductal carcinoma in situ (DCIS) of left breast      02/10/2018 Genetic Testing    Testing did not reveal a pathogenic mutation in any of the genes analyzed.A copy of the genetic test report will be scanned into Epic under the Media tab. The genes analyzed were the 23 genes on [facility]'s Breast/GYN panel (ATM, BARD1, BRCA1, BRCA2, BRIP1, CDH1, CHEK2, DICER1, EPCAM, MLH1,  MSH2, MSH6, NBN, NF1, PALB2, PMS2, PTEN, RAD50, RAD51C, RAD51D,SMARCA4, STK11, and TP53).  Genetic testing involved analysis of 9 genes: ATM, BRCA1, BRCA2, CDH1, CHEK2, PALB2, PTEN, STK11 and TP53 genes. Testing was normal and did not reveal a mutation in these genes.        03/18/2018 Surgery    TOTAL LEFT MASTECTOMY WITH AXILLARY SENTINEL LYMPH NODE BIOPSY and  LEFT BREAST RECONSTRUCTION WITH PLACEMENT OF TISSUE EXPANDER AND FLEX HD (ACELLULAR HYDRATED DERMIS) by Dr. HExcell Seltzerand Dr. [name] 03/18/18      03/18/2018 Pathology Results    Diagnosis 03/18/18 1. Lymph node, sentinel, biopsy, Left Axillary #1 - METASTATIC CARCINOMA IN ONE OF ONE LYMPH NODES (1/1). 2. Lymph node, sentinel, biopsy, Left Axillary #2 - ONE OF ONE LYMPH NODES NEGATIVE FOR CARCINOMA (0/1). 3. Breast, simple mastectomy, Left Total - MICROINVASIVE DUCTAL CARCINOMA ARISING IN A BACKGROUND OF LOW GRADE DUCTAL CARCINOMA IN SITU. - DUCTAL CARCINOMA IN SITU PARTIALLY INVOLVES AN INTRADUCTAL PAPILLOMA. - RESECTION MARGINS ARE NEGATIVE FOR INVASIVE CARCINOMA. - IN SITU CARCINOMA IS PRESENT AT THE ANTERIOR MARGIN BROADLY. - BIOPSY SITE. -  SEE ONCOLOGY TABLE.       03/18/2018 Receptors her2    Estrogen Receptor: 100%, POSITIVE, MODERATE-WEAK STAINING INTENSITY Progesterone Receptor: 100%, POSITIVE, STRONG STAINING INTENSITY Proliferation Marker Ki67: 1% HER2 - NEGATIVE      03/18/2018 Cancer Staging    Staging form: Breast, AJCC  8th Edition - Pathologic stage from 03/18/2018: Stage IA (pT77m, pN1a, cM0, G1, ER+, PR+, HER2-) - Signed by Casey Baker on 03/28/2018       HISTORY OF PRESENTING ILLNESS:  [name]89y.o. 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 The patient was referred by her PCP Dr. CElly Baker The patient presents to the clinic today accompanied by her family member.  Prior to pt's abnormal mammogram, she felt the mass herself and noticed some bloody nipple discharge in January 2019. She states that was her first mammogram. Since the biopsy, she reports the nipple discharge has stopped.   Pt's diagnostic mammogram from 01/27/18 revealed calcifications in the left breast and suspicious bloody discharge from the nippy with palpable masses in 2 o'clock position. Her initial biopsy confirmed low grade DCIS partially involving an intraductal papilloma.  Prognostic indicators significant for: ER, 100% positive and PR, 100% positive, both with strong staining intensity.  She reports her last period was in Dec 2018 and she has starting experiencing hot flashes. She noticed to prior to her last period that it was getting lighter and more irregular.  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 She states she has HTN but has never been started on medication. She reports she has mood swings and she was prescribed Paxil but she has not tried it yet. She has a surgical history of breast tissue excision in her early 20's.   GYN HISTORY  Menarchal: 11-12 LMP: Dec 2018 Contraceptive: none  HRT: n/a  G6P3: first pregnancy at age 47 She has a FMHx of Ovarian CA by her grandmother and Breast CA by her cousin. She denies tobacco use and alcohol use.   [place]    CURRENT THERAPY  PENDING Radiation   INTERVAL HISTORY  Casey BUSEYis here for a follow up to discuss chemotherapy options.  She presents to the clinic today accompanied by her mother.   She notes she still has not had a period since 09/2017. She notes she has hot flashes. She notes she tried Ambien once and is hesitant to continue it as she had it before. She notes she tried trazodone and gabapentin and neither help her sleep, she still has trouble falling asleep.   On review of symptoms, pt notes hot flashes and anxiety.    MEDICAL HISTORY:  Past Medical History:  Diagnosis Date  . Anemia   . Anxiety   . 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 Family history of breast cancer   . Family history of ovarian cancer   . Genetic testing 02/21/2018   STAT Breast panel with reflex to Breast/GYN panel (23 genes) @ [facility] - No pathogenic mutations detected  . Hypertension   . PONV (postoperative nausea and vomiting)   . Uterine fibroid     SURGICAL HISTORY: Past Surgical History:  Procedure Laterality Date  . AXILLARY SURGERY    . BREAST RECONSTRUCTION WITH PLACEMENT OF TISSUE EXPANDER AND FLEX HD (ACELLULAR HYDRATED DERMIS) Left 03/18/2018   Procedure:  LEFT BREAST RECONSTRUCTION WITH PLACEMENT OF TISSUE EXPANDER AND FLEX HD (ACELLULAR HYDRATED DERMIS);  Surgeon: [name], Baker;  Location: [facility];  Service: Plastics;  Laterality: Left;  . KNEE ARTHROSCOPY Left 2013  . MASTECTOMY W/ SENTINEL NODE BIOPSY Left 03/18/2018   Procedure: TOTAL LEFT MASTECTOMY WITH AXILLARY SENTINEL LYMPH NODE BIOPSY;  Surgeon: Excell Seltzer, Baker;  Location: [facility];  Service: General;  Laterality: Left;  . TUBAL LIGATION      SOCIAL HISTORY: Social History   Socioeconomic History  . Marital status: Single    Spouse name: Not on file  . Number of children: Not on file  . Years of education: Not on file  . Highest education level: Not on file  Occupational History  . Not on file  Social Needs  . Financial resource strain: Not on file  . Food insecurity:     Worry: Not on file    Inability: Not on file  . Transportation needs:    Medical: Not on file    Non-medical: Not on file  Tobacco Use  . Smoking status: Never Smoker  . Smokeless tobacco: Never Used  Substance and Sexual Activity  . Alcohol use: No  . Drug use: No  . Sexual activity: Yes  Lifestyle  . Physical activity:    Days per week: 2 days    Minutes per session: 50 min  . Stress: Very much  Relationships  . Social connections:    Talks on phone: More than three times a week    Gets together: More than three times a week    Attends religious service: More than 4 times per year    Active member of club or organization: No    Attends meetings of clubs or organizations: Never    Relationship status: Never married  . Intimate partner violence:    Fear of current or ex partner: No    Emotionally abused: No    Physically abused: No    Forced sexual activity: No  Other Topics Concern  . Not on file  Social History Narrative  . Not on file    FAMILY HISTORY: Family History  Problem Relation Age of Onset  . Hypertension Mother   . Ovarian cancer Maternal Grandmother        dx 72s; deceased 72s  . Breast cancer Cousin        dx 75s; currently 62s; daughter of matenral uncle  . Leukemia Maternal Aunt        currently 94    ALLERGIES:  has No Known Allergies.  MEDICATIONS:  Current Outpatient Medications  Medication Sig Dispense Refill  . ferrous sulfate 325 (65 FE) MG tablet Take 1 tablet (325 mg total) by mouth daily with breakfast. 30 tablet 3  . gabapentin (NEURONTIN) 300 MG capsule Take 1 capsule (300 mg total) by mouth at bedtime. 30 capsule 3  . lisinopril (PRINIVIL,ZESTRIL) 10 MG tablet Take 1 tablet (10 mg total) by mouth daily. 90 tablet 1  . methocarbamol (ROBAXIN) 500 MG tablet Take 1 tablet (500 mg total) by mouth every 8 (eight) hours as needed for muscle spasms. 30 tablet 0  . Multiple Vitamins-Minerals (WOMENS MULTIVITAMIN PO) Take by mouth.    .  oxyCODONE (ROXICODONE) 5 MG immediate release tablet Take 1-2 tablets (5-10 mg total) by mouth every 4 (four) hours as needed. 40 tablet 0  . traZODone (DESYREL) 50 MG tablet Take 1 tablet (50 mg total) by mouth at  bedtime as needed for sleep. 30 tablet 3  . triamcinolone (NASACORT) 55 MCG/ACT AERO nasal inhaler Place 2 sprays into the nose daily. 1 Inhaler 12  . zolpidem (AMBIEN) 5 MG tablet Take 1 tablet (5 mg total) by mouth at bedtime as needed for sleep. 30 tablet 0  . eszopiclone (LUNESTA) 1 MG TABS tablet Take 1-2 tablets (1-2 mg total) by mouth at bedtime as needed for sleep. Take immediately before bedtime 30 tablet 0  . venlafaxine XR (EFFEXOR-XR) 75 MG 24 hr capsule Take 1 capsule (75 mg total) by mouth daily with breakfast. 30 capsule 2   No current facility-administered medications for this visit.     REVIEW OF SYSTEMS:   Constitutional: Denies fevers, chills or abnormal night sweats (+) hot flash (+) insomnia Eyes: Denies blurriness of vision, double vision or watery eyes Ears, nose, mouth, throat, and face: Denies mucositis or sore throat Respiratory: Denies cough, dyspnea or wheezes Cardiovascular: Denies palpitation, chest discomfort or lower extremity swelling Gastrointestinal:  Denies nausea, heartburn or change in bowel habits Skin: Denies abnormal skin rashes Lymphatics: Denies new lymphadenopathy or easy bruising Neurological:Denies numbness, tingling or new weaknesses Behavioral/Psych: Mood is stable, no new changes (+) anxiety All other systems were reviewed with the patient and are negative.  PHYSICAL EXAMINATION: ECOG PERFORMANCE STATUS: 0 - Asymptomatic  Vitals:   04/12/18 1050  BP: 133/79  Pulse: (!) 116  Resp: 19  Temp: 98.8 F (37.1 C)  SpO2: 100%   Filed Weights   04/12/18 1050  Weight: 167 lb 1.6 oz (75.8 kg)    GENERAL:alert, no distress and comfortable SKIN: skin color, texture, turgor are normal, no rashes or significant lesions EYES:  normal, conjunctiva are pink and non-injected, sclera clear OROPHARYNX:no exudate, no erythema and lips, buccal mucosa, and tongue normal  NECK: supple, thyroid normal size, non-tender, without nodularity LYMPH:  no palpable lymphadenopathy in the cervical, axillary or inguinal LUNGS: clear to auscultation and percussion with normal breathing effort HEART: regular rate & rhythm and no murmurs and no lower extremity edema ABDOMEN:abdomen soft, non-tender and normal bowel sounds Musculoskeletal:no cyanosis of digits and no clubbing  PSYCH: alert & oriented x 3 with fluent speech NEURO: no focal motor/sensory deficits Breasts: (+) S/p left breast mastectomy with tissue expander placed: Surgical incision healing well, stiches in place  LABORATORY DATA:  I have reviewed the data as listed CBC Latest Ref Rng & Units 02/10/2018 12/08/2017 06/07/2015  WBC 3.9 - 10.3 K/uL 3.8(L) 5.0 -  Hemoglobin 11.6 - 15.9 g/dL 12.1 8.9(L) 12.2  Hematocrit 34.8 - 46.6 % 39.2 31.8(L) 36.0  Platelets 145 - 400 K/uL 349 406(H) -   CMP Latest Ref Rng & Units 02/10/2018 12/08/2017 06/07/2015  Glucose 70 - 140 mg/dL 97 112(H) 103(H)  BUN 7 - 26 mg/dL '9 6 7  ' Creatinine 0.60 - 1.10 mg/dL 0.84 0.87 0.80  Sodium 136 - 145 mmol/L 140 139 141  Potassium 3.5 - 5.1 mmol/L 3.9 4.3 3.7  Chloride 98 - 109 mmol/L 106 104 103  CO2 22 - 29 mmol/L 25 22 -  Calcium 8.4 - 10.4 mg/dL 9.9 9.1 -  Total Protein 6.4 - 8.3 g/dL 8.2 - -  Total Bilirubin 0.2 - 1.2 mg/dL 0.6 - -  Alkaline Phos 40 - 150 U/L 71 - -  AST 5 - 34 U/L 13 - -  ALT 0 - 55 U/L 8 - -   PATHOLOGY  Diagnosis 03/18/18 1. Lymph node, sentinel, biopsy, Left Axillary #1 -  METASTATIC CARCINOMA IN ONE OF ONE LYMPH NODES (1/1). 2. Lymph node, sentinel, biopsy, Left Axillary #2 - ONE OF ONE LYMPH NODES NEGATIVE FOR CARCINOMA (0/1). 3. Breast, simple mastectomy, Left Total - MICROINVASIVE DUCTAL CARCINOMA ARISING IN A BACKGROUND OF LOW GRADE DUCTAL CARCINOMA IN SITU. -  DUCTAL CARCINOMA IN SITU PARTIALLY INVOLVES AN INTRADUCTAL PAPILLOMA. - RESECTION MARGINS ARE NEGATIVE FOR INVASIVE CARCINOMA. - IN SITU CARCINOMA IS PRESENT AT THE ANTERIOR MARGIN BROADLY. - BIOPSY SITE. - SEE ONCOLOGY TABLE. Microscopic Comment 3. INVASIVE CARCINOMA OF THE BREAST: Resection Procedure: Left simple mastectomy with left axillary sentinel lymph node biopsies. Specimen Laterality: Left. Tumor Size: <1 mm. Histologic Type: Invasive ductal carcinoma. Histologic Grade: Glandular (Acinar)/Tubular Differentiation: 1 Nuclear Pleomorphism: 1 Mitotic Rate: 1 Overall Grade: 1 Ductal Carcinoma In Situ: Extensive, low grade. Partially involves intraductal papilloma. Tumor Extension: Confined to breast parenchyma. Margins: Distance from closest margin (millimeters): >10 mm. Specify closest margin (required only if <67m): N/A. DCIS Margins: Distance from closest margin (millimeters): Present at anterior margin broadly. Specify closest margin (required only if <140m: Anterior. Cannot be determined (explain): N/A. Regional Lymph Nodes: Number of Lymph Nodes Examined: 2 Number of Sentinel Nodes Examined (if applicable): 2 Number of Lymph Nodes with Macrometastases (>2 mm): 1 2 of 4 FINAL for Mancera, Louis L (S([id number] Comment(continued) Number of Lymph Nodes with Micrometastases: 0 Number of Lymph Nodes with Isolated Tumor Cells (0.2 mm or 200 cells)#: 0 Size of Largest Metastatic Deposit (millimeters): 2 mm. Extranodal Extension: None. Treatment Effect: No known presurgical therapy Breast Biomarker Testing Performed on Previous Biopsy: Will be attempted on part #1. Testing Performed on Case Number: [id number] Receptor: Positive, 100% strong staining. Progesterone Receptor: Positive, 100% strong staining. HER2: N/A. Ki-67: N/A. Representative tumor block: 1A, 3C (limited). Pathologic Stage Classification (pTNM, AJCC 8th Edition): pT1m60m pN1a Comments: Immunohistochemistry is performed on several blocks. There is focal lose of basal cell markers (calponin, smooth muscle myosin) corresponding to microinvasion (block 3C). (v4.2.0.0) 1. PROGNOSTIC INDICATORS Results: IMMUNOHISTOCHEMICAL AND MORPHOMETRIC ANALYSIS PERFORMED MANUALLY Estrogen Receptor: 100%, POSITIVE, MODERATE-WEAK STAINING INTENSITY Progesterone Receptor: 100%, POSITIVE, STRONG STAINING INTENSITY Proliferation Marker Ki67: 1% REFERENCE RANGE ESTROGEN RECEPTOR NEGATIVE 0% POSITIVE =>1% REFERENCE RANGE PROGESTERONE RECEPTOR NEGATIVE 0% POSITIVE =>1% 1. FLUORESCENCE IN-SITU HYBRIDIZATION Results: HER2 - NEGATIVE RATIO OF HER2/CEP17 SIGNALS 1.13 AVERAGE HER2 COPY NUMBER PER CELL 1.70 Reference Range: NEGATIVE HER2/CEP17 Ratio <2.0 and average HER2 copy number <4.0 EQUIVOCAL HER2/CEP17 Ratio <2.0 and average HER2 copy number >=4.0 and <6.0    Diagnosis 01/31/18 1. 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 - NEGATIVE FOR INVASIVE CARCINOMA. 2. 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 - NEGATIVE FOR INVASIVE CARCINOMA. 1 of 2 FINAL for EATAHMANI, [id number] Note 1. Dr. KisLyndon Codes reviewed this case and concurs with the above diagnosis. The [facility] notified on 02/01/18. A breast prognostic profile is pending and will be reported in an addendum. (NK:kh 02/01/18) Results: Estrogen Receptor: 100%, POSITIVE, STRONG STAINING INTENSITY Progesterone Receptor: 100%, POSITIVE, STRONG STAINING INTENSITY  RADIOGRAPHIC STUDIES: I have personally reviewed the radiological images as listed and agreed with the findings in the report.  Diagnostic Mammogram and [place]11/19 IMPRESSION: Suspicious bloody left nipple discharge with  palpable masses in the 2 o'clock axis of the left breast. Findings are  suspicious for Malignancy. ADDENDUM: Magnification views of the retroareolar right breast were performed to evaluate calcifications seen on the patient's baseline mammogram. There are loosely grouped and scattered calcifications in the outer right breast, superior to and directly posterior to the nipple. These calcifications are rounded and smudgy in the CC projection, and many of them demonstrate layering on the 90 degree lateral magnification view, consistent with benign milk of calcium. No suspicious microcalcifications are seen in the right breast.  Nm Sentinel Node Inj-no Rpt (breast)  Result Date: 03/18/2018 Sulfur colloid was injected by the nuclear medicine technologist for melanoma sentinel node.    ASSESSMENT & PLAN:  Casey Baker is 47 year old pre-menopausal woman, presented with screening discovered to DCIS.   1.  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 -She underwent left breast mastectomy and tissue expander placement on 03/18/18 -Her biopsy initially only showed DCIS in her left breast, her surgical pathology shows evidence of DCIS with microinvasion, low grade and 1 out of 2 positive lymph nodes. She had negative surgical margins, her tumor was completely resected.  -She did not have enough surgical tissue sample for mammaprint or oncotype testing.  -Given her family history of breast cancer and young age, genetic testing was recommended to her. Referral was previously made and her results were negative for gene mutations.  -I discussed given her grade 1 tumor, low Ki67 (1%) microinvasive disease and 1 positive lymph node which was only 39m (close to  micrometastasis), I suspect she has low risk disease even if we did Oncotype or mammaprint.  I think the benefit of adjuvant chemotherapy could be small.  -However before we had Oncotype and the mammaprint, we usually do of her adjuvant chemotherapy for node positive breast cancer, especially patient's young age like her. I discussed that with her.  If she wants to be aggressive, take any treatment to potentially reduce her risk, I think is also reasonable to proceed with adjuvant chemotherapy, such as docetaxel and Cytoxan for 4 cycles.  Potential side effects of chemotherapy were discussed with her and her mother in details.  -After lengthy discussion, she opts not to take chemotherapy, which I think is reasonable.  -I will refer her to radiation oncologist Dr. KSondra Come She will proceed with radiation given her positive lymph node.  -Given her strongly ER and PR positivity of the tumor cells, and her pre-menopause status, I recommend adjuvant endocrine therapy with tamoxifen to prevent future breast cancer. The potential side effects were discussed with her in great details. She voiced good understanding, and she is interested. Plan to start after radiation.  -Her menstrual period has become less and lighter, she is probably perimenopausal, when she becomes postmenopausal, I will switch her tamoxifen to aromatase inhibitor. -She will proceed to see her gynecologist for her fibroids, given she is perimenopause there is not much benefit for a hysterectomy.   -F/u last week of radiation  2. HTN   -I previously prescribed her 10 mg lisinopril on 02/10/18 and advised her to check her BP more regularly.   -I previously encouraged her to follow-up with her primary care physician to see if she needs high dose lisinopril.  3. 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 She takes  oral iron daily and has had IV iron and blood transfusion in the past. Last  transfusion in 2017 -On 362m Ferrous Sulfate daily.  -Will monitor her labs while she is being followed by me and I will consider IV iron if needed.   4. Hot Flashes, Mood disorder, insomnia  -Her last period was Dec 2018 and she has been having hot flashes since.  -I previously switched her Paxil to Effexor 37.5 mg in 01/2018 due to drug interaction with Tamoxifen.  -For her insomnia, I previously prescribed her Ambien 570mto start with half tablet to help her sleep. She is hesitant to continue due to side effects.    -I prescribed her Lunesta at 88m388mnd if not enough she can increase to 2mg75m will give her 1 months supply (04/12/18) -I again offered social work consult. She is interested, I will place referral.  -I will increase her Effexor to 75mg70mefilled today (04/12/18). If this is not enough I discussed the possibility of XANAX.    No orders of the defined types were placed in this encounter.  PLAN:  -She decided not to take adjuvant chemo -I increased her Effexor to 75mg 188mprescribed her Lunesta 88mg to7m  -SW referral for anxiety  -Rad/onc referral  -Lab and f/u in 7-8 weeks (the last week of radiation)  All questions were answered. The patient knows to call the clinic with any problems, questions or concerns. I spent 30 minutes counseling the patient face to face. The total time spent in the appointment was 35 minutes and more than 50% was on counseling.  I, AmoyOneal Deputyting as scribe for Aeon Koors FenTruitt Merle I have reviewed the above documentation for accuracy and completeness, and I agree with the above.     Ben Sanz FenTruitt Merle25/2019

## 2018-04-12 NOTE — Progress Notes (Signed)
Received fax from [facility], stating that insurance does not cover Eszopicolone, forwarded to [facility] for [facility].

## 2018-04-13 ENCOUNTER — Telehealth: Payer: Self-pay | Admitting: *Deleted

## 2018-04-13 ENCOUNTER — Other Ambulatory Visit: Payer: Self-pay | Admitting: Hematology

## 2018-04-13 ENCOUNTER — Encounter: Payer: Self-pay | Admitting: Radiation Oncology

## 2018-04-13 DIAGNOSIS — C50412 Malignant neoplasm of upper-outer quadrant of left female breast: Secondary | ICD-10-CM

## 2018-04-13 DIAGNOSIS — Z170 Estrogen receptor positive status [ER+]: Principal | ICD-10-CM

## 2018-04-13 NOTE — Telephone Encounter (Signed)
Spoke with pt and informed her that her insurance denied Lunesta.  Instructed pt to follow up with her PCP for insomnia issue.  Pt voiced understanding.

## 2018-04-13 NOTE — Progress Notes (Unsigned)
Ambulatory refer

## 2018-04-13 NOTE — Telephone Encounter (Signed)
-----   Message from Truitt Merle, MD sent at 04/13/2018  1:52 PM EDT ----- Regarding: RE: Eszopiclone 1 mg (Lunesta) Thu, please let pt know that Johnnye Sima was denied by her insurance. I recommend her to f/u with her PCP for her insomnia  Thanks  Krista Blue  ----- Message ----- From: Stephanie Coup, LPN Sent: 5/32/9924   1:22 PM To: Truitt Merle, MD Subject: RE: Eszopiclone 1 mg (Lunesta)                 I called [employer] to initiate a PA for this medication and they will not approve it. They recommended the provider to change medications.  ----- Message ----- From: Truitt Merle, MD Sent: 04/12/2018   4:13 PM To: Jonnie Finner, RN, Stephanie Coup, LPN Subject: RE: Eszopiclone 1 mg Johnnye Sima)                 It's Benn [place] for insomnia, she could not tolerate ambien.  Thanks  Krista Blue  ----- Message ----- From: Jonnie Finner, RN Sent: 04/12/2018   3:48 PM To: Truitt Merle, MD, Stephanie Coup, LPN Subject: Eszopiclone 1 mg Johnnye Sima)                     Erline Levine, We received a fax from [place] medication is not covered and needs PA .  Patient ID [id number] T  Thank you, Malachy Mood

## 2018-04-14 ENCOUNTER — Telehealth: Payer: Self-pay | Admitting: General Practice

## 2018-04-14 NOTE — Telephone Encounter (Signed)
[place] CSW Progress Note  Referral from MD to assess and support patient who is experiencing anxiety.  CSW called, spoke w patient.  PAtient would like to meet w CSW after next appt on July 8th.  CSW [employer] to schedule w patient as Grayce Sessions is not working on that day.  Also offered [employer] guide to patient - will refer and ask [employer] to reach out for additional support.  Information packet mailed on [employer] and activities.  Edwyna Shell, LCSW Clinical Social Worker Phone:  [phone]

## 2018-04-20 ENCOUNTER — Ambulatory Visit (INDEPENDENT_AMBULATORY_CARE_PROVIDER_SITE_OTHER): Payer: Medicaid Other | Admitting: Obstetrics and Gynecology

## 2018-04-20 ENCOUNTER — Encounter: Payer: Self-pay | Admitting: Obstetrics and Gynecology

## 2018-04-20 ENCOUNTER — Other Ambulatory Visit (HOSPITAL_COMMUNITY)
Admission: RE | Admit: 2018-04-20 | Discharge: 2018-04-20 | Disposition: A | Discharge status: Home / Self Care | Payer: Medicaid Other | Source: Ambulatory Visit | Attending: Obstetrics and Gynecology | Admitting: Obstetrics and Gynecology

## 2018-04-20 VITALS — BP 145/87 | HR 117 | Wt 170.4 lb

## 2018-04-20 DIAGNOSIS — Z3202 Encounter for pregnancy test, result negative: Secondary | ICD-10-CM

## 2018-04-20 DIAGNOSIS — N951 Menopausal and female climacteric states: Secondary | ICD-10-CM | POA: Diagnosis not present

## 2018-04-20 DIAGNOSIS — Z853 Personal history of malignant neoplasm of breast: Secondary | ICD-10-CM | POA: Insufficient documentation

## 2018-04-20 LAB — POCT PREGNANCY, URINE: Preg Test, Ur: NEGATIVE

## 2018-04-20 NOTE — Progress Notes (Signed)
Obstetrics and Gynecology New Patient Evaluation  Appointment Date: 04/20/2018  OBGYN Clinic: [facility]  Primary Care Provider: Ladell Pier  Referring Provider: Ladell Pier, MD  Chief Complaint: concern re: fibroids, perimenopausal s/s  History of Present Illness: Casey Baker is a 47 y.o. African-American [phone] (Patient's last menstrual period was 11/17/2017.), seen for the above chief complaint. Her past medical history is significant for recent dx of ER/PR+ breast cancer s/p surgery and for radiation and tamoxifen therapy later this year.   Has had climateric s/s for several months to years (hot flashes and night sweats) and last had a period in December 2018. She had some spotting about two months ago. No recent imaging but a 2016 CT A/P showed a 12 x 19 x 20cm uterus with multiple fibroids with some calcified.    No breast s/s, fevers, chills, chest pain, SOB, nausea, vomiting, abdominal pain, dysuria, hematuria, vaginal itching, dyspareunia, SUI or OAB, diarrhea, constipation, blood in BMs  Review of Systems:  as noted in the History of Present Illness.  Patient Active Problem List   Diagnosis Date Noted  . Breast cancer, left breast ([place]) 03/18/2018  . Genetic testing 02/21/2018  . Family history of breast cancer   . Family history of ovarian cancer   . Iron deficiency anemia due to chronic blood loss 02/09/2018  . Malignant neoplasm of upper-outer quadrant of left female breast ([place]) 02/03/2018  . Breast wound, left, subsequent encounter 01/12/2018  . Hypertension   . Anxiety with depression   . Uterine fibroid 09/29/2014  . Menorrhagia 09/29/2014  . Anemia 09/29/2014  . Sinus tachycardia     Past Medical History:  Past Medical History:  Diagnosis Date  . Anemia   . Anxiety   . 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 Family history of breast cancer   . Family history of ovarian  cancer   . Genetic testing 02/21/2018   STAT Breast panel with reflex to Breast/GYN panel (23 genes) @ [facility] - No pathogenic mutations detected  . Hypertension   . PONV (postoperative nausea and vomiting)   . Uterine fibroid     Past Surgical History:  Past Surgical History:  Procedure Laterality Date  . AXILLARY SURGERY    . BREAST RECONSTRUCTION WITH PLACEMENT OF TISSUE EXPANDER AND FLEX HD (ACELLULAR HYDRATED DERMIS) Left 03/18/2018   Procedure: LEFT BREAST RECONSTRUCTION WITH PLACEMENT OF TISSUE EXPANDER AND FLEX HD (ACELLULAR HYDRATED DERMIS);  Surgeon: [name], MD;  Location: [facility];  Service: Plastics;  Laterality: Left;  . KNEE ARTHROSCOPY Left 2013  . MASTECTOMY W/ SENTINEL NODE BIOPSY Left 03/18/2018   Procedure: TOTAL LEFT MASTECTOMY WITH AXILLARY SENTINEL LYMPH NODE BIOPSY;  Surgeon: Excell Seltzer, MD;  Location: [facility];  Service: General;  Laterality: Left;  . TUBAL LIGATION      Past Obstetrical History:  OB History  Gravida Para Term Preterm AB Living  '6       3 3  ' SAB TAB Ectopic Multiple Live Births  '2 1     3    ' # Outcome Date GA Lbr Len/2nd Weight Sex Delivery Anes PTL Lv  6 Gravida           5 Gravida           4 Gravida           3 SAB  2 SAB           1 TAB             Obstetric Comments  SVD x 3    Past Gynecological History: As per HPI. History of Pap Smear(s): 01/2018, NILM/HPV negative  Social History:  Social History   Socioeconomic History  . Marital status: Single    Spouse name: Not on file  . Number of children: Not on file  . Years of education: Not on file  . Highest education level: Not on file  Occupational History  . Not on file  Social Needs  . Financial resource strain: Not on file  . Food insecurity:    Worry: Not on file    Inability: Not on file  . Transportation needs:    Medical: Not on file    Non-medical: Not on file  Tobacco Use  . Smoking status:  Never Smoker  . Smokeless tobacco: Never Used  Substance and Sexual Activity  . Alcohol use: No  . Drug use: No  . Sexual activity: Yes  Lifestyle  . Physical activity:    Days per week: 2 days    Minutes per session: 50 min  . Stress: Very much  Relationships  . Social connections:    Talks on phone: More than three times a week    Gets together: More than three times a week    Attends religious service: More than 4 times per year    Active member of club or organization: No    Attends meetings of clubs or organizations: Never    Relationship status: Never married  . Intimate partner violence:    Fear of current or ex partner: No    Emotionally abused: No    Physically abused: No    Forced sexual activity: No  Other Topics Concern  . Not on file  Social History Narrative  . Not on file    Family History:  Family History  Problem Relation Age of Onset  . Hypertension Mother   . Ovarian cancer Maternal Grandmother        dx 19s; deceased 45s  . Breast cancer Cousin        dx 41s; currently 41s; daughter of matenral uncle  . Leukemia Maternal Aunt        currently 27    Medications [name] had no medications administered during this visit. Current Outpatient Medications  Medication Sig Dispense Refill  . cyclobenzaprine (FLEXERIL) 10 MG tablet Take 10 mg by mouth 3 (three) times daily as needed for muscle spasms.    . eszopiclone (LUNESTA) 1 MG TABS tablet Take 1-2 tablets (1-2 mg total) by mouth at bedtime as needed for sleep. Take immediately before bedtime 30 tablet 0  . lisinopril (PRINIVIL,ZESTRIL) 10 MG tablet Take 1 tablet (10 mg total) by mouth daily. 90 tablet 1  . methocarbamol (ROBAXIN) 500 MG tablet Take 1 tablet (500 mg total) by mouth every 8 (eight) hours as needed for muscle spasms. 30 tablet 0  . Multiple Vitamins-Minerals (WOMENS MULTIVITAMIN PO) Take by mouth.    . oxyCODONE (ROXICODONE) 5 MG immediate release tablet Take 1-2 tablets (5-10  mg total) by mouth every 4 (four) hours as needed. 40 tablet 0  . ferrous sulfate 325 (65 FE) MG tablet Take 1 tablet (325 mg total) by mouth daily with breakfast. (Patient not taking: Reported on 04/20/2018) 30 tablet 3  . gabapentin (NEURONTIN) 300 MG capsule Take 1  capsule (300 mg total) by mouth at bedtime. (Patient not taking: Reported on 04/20/2018) 30 capsule 3  . traZODone (DESYREL) 50 MG tablet Take 1 tablet (50 mg total) by mouth at bedtime as needed for sleep. (Patient not taking: Reported on 04/20/2018) 30 tablet 3  . triamcinolone (NASACORT) 55 MCG/ACT AERO nasal inhaler Place 2 sprays into the nose daily. (Patient not taking: Reported on 04/20/2018) 1 Inhaler 12  . venlafaxine XR (EFFEXOR-XR) 75 MG 24 hr capsule Take 1 capsule (75 mg total) by mouth daily with breakfast. (Patient not taking: Reported on 04/20/2018) 30 capsule 2  . zolpidem (AMBIEN) 5 MG tablet Take 1 tablet (5 mg total) by mouth at bedtime as needed for sleep. (Patient not taking: Reported on 04/20/2018) 30 tablet 0   No current facility-administered medications for this visit.     Allergies Patient has no known allergies.   Physical Exam:  BP (!) 145/87   Pulse (!) 117   Wt 170 lb 6.4 oz (77.3 kg)   LMP 11/17/2017 Comment: tubes tied-   BMI 26.69 kg/m  Body mass index is 26.69 kg/m. General appearance: Well nourished, well developed female in no acute distress.  Neck:  Supple, normal appearance, and no thyromegaly  Cardiovascular: normal s1 and s2.  No murmurs, rubs or gallops. Respiratory:  Clear to auscultation bilateral. Normal respiratory effort Abdomen: positive bowel sounds and no masses, hernias; diffusely non tender to palpation, non distended Neuro/Psych:  Normal mood and affect.  Skin:  Warm and dry.  Lymphatic:  No inguinal lymphadenopathy.   Pelvic exam: is not limited by body habitus EGBUS: within normal limits, Vagina: within normal limits and with no blood or discharge in the vault, Cervix: normal  appearing cervix without tenderness, discharge or lesions. Uterus:  enlarged, c/w 14-16 week size and non tender and Adnexa:  normal adnexa and no mass, fullness, tenderness Rectovaginal: deferred  See procedure note for endometrial biopsy  Laboratory: UPT negative  Radiology: no new imaging  Assessment: pt stable  Plan:  1. Perimenopausal symptoms D/w her that s/s are very consistent with perimenopause. I told her that if embx is negative that malignancy is essentially ruled out and if she goes a year w/o a period then she is officially in menopause. I told her that with menopause her fibroids should go down in size. Pt is concerned, given the recent breast cancer diagnosis, re: ovarian cancer and uterine cancer. I told her that it's reassuring that her brca testing is negative and that having breast cancer at an early age does put her at higher risk for uterine or ovarian cancer but there's nothing to indicate now that she needs surgical intervention b/c she is inquiring about hysterecomy and bso. I told her that there isn't a great way to monitor for ovarian cancer and that I'd recommend exp management. Pt is very anxious so she is amenable to referral to gyn oncology to d/w her re: if surgical intervention is need, if any monitoring is needed, etc.   RTC PRN  Durene Romans MD Attending Center for [facility])

## 2018-04-20 NOTE — Progress Notes (Signed)
Patient [place] behavioral health clinician, states she has an appointment Monday 04/25/2018 at the cancer center to speak with someone.

## 2018-04-20 NOTE — Procedures (Signed)
Endometrial Biopsy Procedure Note  Pre-operative Diagnosis: AUB, perimenopausal symptoms, fibroids  Post-operative Diagnosis: same   Procedure Details  Urine pregnancy test was done and result was negative.  The risks (including infection, bleeding, pain, and uterine perforation) and benefits of the procedure were explained to the patient and Written informed consent was obtained.  The patient was placed in the dorsal lithotomy position.  Bimanual exam showed the uterus to be in the neutral position.  A Graves' speculum inserted in the vagina, and the cervix was then prepped with povidone iodine, and a sharp tenaculum was applied to the anterior lip of the cervix for stabilization.  A pipelle was inserted into the uterine cavity and sounded the uterus to a depth of 13cm.  A Small amount of tissue was collected after 3 passes. The sample was sent for pathologic examination.  Condition: Stable  Complications: None  Plan: The patient was advised to call for any fever or for prolonged or severe pain or bleeding. She was advised to use OTC analgesics as needed for mild to moderate pain. She was advised to avoid vaginal intercourse for 48 hours or until the bleeding has completely stopped.  Durene Romans MD Attending Center for [employer] [facility])

## 2018-04-20 NOTE — Progress Notes (Signed)
Location of Breast Cancer:  Upper-outer of left female breast  Histology per Pathology Report: 1. Lymph node, sentinel, biopsy, Left Axillary #1 - METASTATIC CARCINOMA IN ONE OF ONE LYMPH NODES (1/1). 2. Lymph node, sentinel, biopsy, Left Axillary #2 - ONE OF ONE LYMPH NODES NEGATIVE FOR CARCINOMA (0/1). 3. Breast, simple mastectomy, Left Total - MICROINVASIVE DUCTAL CARCINOMA ARISING IN A BACKGROUND OF LOW GRADE DUCTAL CARCINOMA IN SITU. - DUCTAL CARCINOMA IN SITU PARTIALLY INVOLVES AN INTRADUCTAL PAPILLOMA. - RESECTION MARGINS ARE NEGATIVE FOR INVASIVE CARCINOMA. - IN SITU CARCINOMA IS PRESENT AT THE ANTERIOR MARGIN BROADLY.  Receptor Status: ER(100%, POSITIVE, MODERATE-WEAK STAINING INTENSITY), PR (100%, POSITIVE, STRONG STAINING INTENSITY), Her2-neu (negative), Ki-67 (1%)  Did patient present with symptoms (if so, please note symptoms) or was this found on screening mammography?: She underwent bilateral diagnostic mammography with tomography and bilateral breast ultrasonography at [facility] on 01/27/2018 showing: Breast density category C. Suspicious bloody left nipple discharge with palpable masses in the 2 o'clock axis of the left breast. Findings are suspicious for malignancy.    Past/Anticipated interventions by surgeon, if any: Procedure: Procedure(s): TOTAL LEFT MASTECTOMY WITH AXILLARY SENTINEL LYMPH NODE BIOPSY    Surgeon: Excell Seltzer T   POSTOPERATIVE DIAGNOSES:  same  PROCEDURE:  1. Left breast reconstruction with tissue expander 2. Acellular dermis ([employer]) for breast reconstruction 320cm2  SURGEON: [name] MD MBA    Past/Anticipated interventions by medical oncology, if any: Chemotherapy Per Dr. Burr Medico:  -After lengthy discussion, she opts not to take chemotherapy, which I think is reasonable.  -I will refer her to radiation oncologist Dr. Sondra Come. She will proceed with radiation given her positive lymph node.  -Given her strongly ER and PR  positivity of the tumor cells, and her pre-menopause status, I recommend adjuvant endocrine therapy with tamoxifen to prevent future breast cancer. The potential side effects were discussed with her in great details. She voiced good understanding, and she is interested. Plan to start after radiation.    Lymphedema issues, if any:  No  Pain issues, if any: No  SAFETY ISSUES:  Prior radiation? No  Pacemaker/ICD? No  Possible current pregnancy? No  Is the patient on methotrexate? No  Current Complaints / other details:  Pt presents today for initial consult with Dr. Sondra Come for Radiation Oncology. Pt is unaccompanied. Pt is tearful.     [name], RN 04/25/2018,9:35 AM

## 2018-04-22 ENCOUNTER — Encounter: Payer: Self-pay | Admitting: Obstetrics and Gynecology

## 2018-04-25 ENCOUNTER — Telehealth: Payer: Self-pay | Admitting: *Deleted

## 2018-04-25 ENCOUNTER — Ambulatory Visit
Admission: RE | Admit: 2018-04-25 | Discharge: 2018-04-25 | Disposition: A | Discharge status: Home / Self Care | Payer: Medicaid Other | Source: Ambulatory Visit | Attending: Radiation Oncology | Admitting: Radiation Oncology

## 2018-04-25 ENCOUNTER — Other Ambulatory Visit: Payer: Self-pay

## 2018-04-25 ENCOUNTER — Encounter: Payer: Self-pay | Admitting: Radiation Oncology

## 2018-04-25 VITALS — BP 143/83 | HR 103 | Temp 98.3°F | Resp 18 | Wt 170.0 lb

## 2018-04-25 DIAGNOSIS — Z79899 Other long term (current) drug therapy: Secondary | ICD-10-CM | POA: Insufficient documentation

## 2018-04-25 DIAGNOSIS — R0789 Other chest pain: Secondary | ICD-10-CM | POA: Diagnosis not present

## 2018-04-25 DIAGNOSIS — C50412 Malignant neoplasm of upper-outer quadrant of left female breast: Secondary | ICD-10-CM | POA: Insufficient documentation

## 2018-04-25 DIAGNOSIS — Z170 Estrogen receptor positive status [ER+]: Secondary | ICD-10-CM | POA: Insufficient documentation

## 2018-04-25 DIAGNOSIS — Z9012 Acquired absence of left breast and nipple: Secondary | ICD-10-CM | POA: Insufficient documentation

## 2018-04-25 DIAGNOSIS — D0512 Intraductal carcinoma in situ of left breast: Secondary | ICD-10-CM

## 2018-04-25 NOTE — Telephone Encounter (Signed)
Called the patient, gave the appt date/time of July 12th at 11am. Ask the patient to arrive at 10:30am to fill out paperwork

## 2018-04-25 NOTE — Progress Notes (Signed)
Radiation Oncology         ([phone]) [phone] ________________________________  Name: Casey Baker MRN: [id number]  Date: 04/25/2018  DOB: [birth year]  Re-Evaluation Visit Note  CC: Ladell Pier, MD  Truitt Merle, MD    ICD-10-CM   1. Malignant neoplasm of upper-outer quadrant of left breast in female, estrogen receptor positive ([place]) C50.412    Z17.0     Diagnosis:   47 y.o. female with Clinical stage 0 (Tis, Nx), Pathologic Stage IA (pT62m, pN1a) Left Breast UOQ Microinvasive Ductal Carcinoma, ER(+) / PR(+) / Her2 (-), Grade 1  Narrative:  The patient returns today for re-evaluation following her total left mastectomy and tissue expander placement on 03/18/2018. Final pathology revealed microinvasive ductal carcinoma arising in a background of low grade DCIS. Her carcinoma is ER 100%, PR 100%, Her2 negative, Ki-67 1%. DCIS partially involves an intraductal papilloma. Resection margins are negative for invasive carcinoma, however in situ carcinoma is present at the anterior margin broadly. There is also metastatic carcinoma in 1 of 2 lymph nodes biopsied. She did not have enough surgical tissue sample for Mammaprint or Oncotype testing. She opted to not receive chemotherapy. She returns today to further discuss the role of adjuvant radiation therapy in the treatment of her breast cancer. She will start adjuvant Tamoxifen after completion of radiation. She states that she no longer has menstrual periods.  On review of systems, she denies any swelling, numbness, or tingling to her left axilla and arm. She reports occasional pain to the left chest wall area and takes oxycodone as needed.  ALLERGIES:  has No Known Allergies.  Meds: Current Outpatient Medications  Medication Sig Dispense Refill  . cyclobenzaprine (FLEXERIL) 10 MG tablet Take 10 mg by mouth 3 (three) times daily as needed for muscle spasms.    .Marland Kitchenlisinopril (PRINIVIL,ZESTRIL) 10 MG tablet Take 1 tablet (10 mg total) by mouth  daily. 90 tablet 1  . Multiple Vitamins-Minerals (WOMENS MULTIVITAMIN PO) Take by mouth.    . oxyCODONE (ROXICODONE) 5 MG immediate release tablet Take 1-2 tablets (5-10 mg total) by mouth every 4 (four) hours as needed. 40 tablet 0  . eszopiclone (LUNESTA) 1 MG TABS tablet Take 1-2 tablets (1-2 mg total) by mouth at bedtime as needed for sleep. Take immediately before bedtime (Patient not taking: Reported on 04/25/2018) 30 tablet 0  . ferrous sulfate 325 (65 FE) MG tablet Take 1 tablet (325 mg total) by mouth daily with breakfast. (Patient not taking: Reported on 04/20/2018) 30 tablet 3  . gabapentin (NEURONTIN) 300 MG capsule Take 1 capsule (300 mg total) by mouth at bedtime. (Patient not taking: Reported on 04/20/2018) 30 capsule 3  . methocarbamol (ROBAXIN) 500 MG tablet Take 1 tablet (500 mg total) by mouth every 8 (eight) hours as needed for muscle spasms. (Patient not taking: Reported on 04/25/2018) 30 tablet 0  . traZODone (DESYREL) 50 MG tablet Take 1 tablet (50 mg total) by mouth at bedtime as needed for sleep. (Patient not taking: Reported on 04/20/2018) 30 tablet 3  . triamcinolone (NASACORT) 55 MCG/ACT AERO nasal inhaler Place 2 sprays into the nose daily. (Patient not taking: Reported on 04/20/2018) 1 Inhaler 12  . venlafaxine XR (EFFEXOR-XR) 75 MG 24 hr capsule Take 1 capsule (75 mg total) by mouth daily with breakfast. (Patient not taking: Reported on 04/20/2018) 30 capsule 2  . zolpidem (AMBIEN) 5 MG tablet Take 1 tablet (5 mg total) by mouth at bedtime as needed for sleep. (Patient not  taking: Reported on 04/20/2018) 30 tablet 0   No current facility-administered medications for this encounter.     Physical Findings: The patient is in no acute distress. Patient is alert and oriented.  weight is 170 lb (77.1 kg). Her temperature is 98.3 F (36.8 C). Her blood pressure is 143/83 (abnormal) and her pulse is 103 (abnormal). Her respiration is 18 and oxygen saturation is 100%.   Lungs are clear to  auscultation bilaterally. Heart has regular rate and rhythm. No palpable cervical, supraclavicular, or axillary adenopathy. Abdomen soft, non-tender, normal bowel sounds.  Left Chest Wall: She has a tissue expander in place. Some eschar noted along the inferior aspect of the reconstructed breast. No active drainage or signs of infection.  Lab Findings: Lab Results  Component Value Date   WBC 3.8 (L) 02/10/2018   HGB 12.1 02/10/2018   HCT 39.2 02/10/2018   MCV 72.3 (L) 02/10/2018   PLT 349 02/10/2018    Radiographic Findings: No results found.  Impression:  Clinical stage 0 (Tis, Nx), Pathologic Stage IA (pT97m, pN1a) Left Breast UOQ Microinvasive Ductal Carcinoma, ER(+) / PR(+) / Her2 (-), Grade 1  Patient was suprisingly found to have microinvasive disease and lymph node metastasis at the time of her mastectomy for extensive DCIS. In light of the lymph node positive disease, I would recommend post-mastectomy radiation therapy.  Today, I talked to the patient and family about the findings and work-up thus far.  We discussed the natural history of breast cancer and general treatment, highlighting the role of radiotherapy in the management.  We discussed the available radiation techniques, and focused on the details of logistics and delivery.  We reviewed the anticipated acute and late sequelae associated with radiation in this setting.  The patient was encouraged to ask questions that I answered to the best of my ability.  A patient consent form was discussed and signed.  We retained a copy for our records.  The patient would like to proceed with radiation and will be scheduled for CT simulation.  Plan:  CT simulation/planning next Tuesday July 16th at 10:00 AM. She will see Dr. TIran Planason July 18th. Assuming she continues to heal well we will start her treatments the week of July 22nd. We will make a formal referral to physical therapy.  ____________________________________  JBlair Promise PhD, MD  This document serves as a record of services personally performed by JGery Pray MD. It was created on his behalf by HRae Lips a trained medical scribe. The creation of this record is based on the scribe's personal observations and the provider's statements to them. This document has been checked and approved by the attending provider.

## 2018-04-25 NOTE — Telephone Encounter (Signed)
Received a phone call from GYN/ONC re: appt for referral - she has called patient and notified her is this Friday 04/29/18.  at 11:00. Will forward to Dr. Ilda Basset

## 2018-04-26 ENCOUNTER — Telehealth: Payer: Self-pay

## 2018-04-26 NOTE — Telephone Encounter (Signed)
Pt called wanting test results of biopsy.

## 2018-04-27 NOTE — Progress Notes (Signed)
GYNECOLOGIC ONCOLOGY - [facility] [place] [facility] [place] Note: New Patient FIRST VISIT  Consult was requested by Dr. Aletha Halim   Chief Complaint  Patient presents with  . Fibroids    HPI: Ms. Casey Baker  is a very nice 47 y.o.  P3  Referred for h/o fibroids and patient concern about her perceived increased GYN cancer risks.   From the [facility] 2016 CT where the uterus was over 20cm. TVUS done today as below confirms with no significant increase in size.  She has a h/o ER/PR + breast cancer. Genetic testing has been done ([facility] Breast/GYN panel) and was negative for mutations. She had a mastectomy and is supposed to start radiation soon.  She is concerned about her risk for ovarian cancer and uterine cancer and is seeking a second opinion as to whether to pursue BSO and hysterectomy. She has not discussed BSO with Dr.Feng yet, in the face of ER/PR+ breast cancer. She is going to be starting Tamoxifen per her report to me.  She is having oligomenorrhea with a normal period in December 2018 and then some bleeding again in May 2019. Dr. Ilda Basset performed an EMB which looks to have potential inactive endometrium, no hyperplasia or atypia.  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 Findings are suspicious for Malignancy. ADDENDUM: Magnification views of the retroareolar right breast were  performed to evaluate calcifications seen on the patient's baseline mammogram. There are loosely grouped and scattered calcifications in the outer right breast, superior to and directly posterior to the nipple. These calcifications are rounded and smudgy in the CC projection, and many of them demonstrate layering on the 90 degree lateral magnification view, consistent with benign milk of calcium. No suspicious microcalcifications are seen in the right breast.      01/31/2018 Initial Biopsy    Diagnosis 01/31/18 1. 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 - NEGATIVE FOR INVASIVE CARCINOMA. 2. 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 - NEGATIVE FOR INVASIVE CARCINOMA.      01/31/2018 Receptors her2    Prognostic indicators significant for: ER, 100% positive and PR, 100% positive, both with strong staining intensity.       02/03/2018 Initial Diagnosis    Ductal carcinoma in situ (DCIS) of left breast      02/10/2018 Genetic Testing    Testing did not reveal a pathogenic mutation in any of the genes analyzed.A copy of the genetic test report will be scanned into Epic under the Media tab. The genes analyzed were the 23 genes on [facility]'s Breast/GYN panel (ATM, BARD1, BRCA1, BRCA2, BRIP1, CDH1, CHEK2, DICER1, EPCAM, MLH1,  MSH2, MSH6, NBN, NF1, PALB2, PMS2, PTEN, RAD50, RAD51C, RAD51D,SMARCA4, STK11, and TP53).  Genetic  testing involved analysis of 9 genes: ATM, BRCA1, BRCA2, CDH1, CHEK2, PALB2, PTEN, STK11 and TP53 genes. Testing was normal and did not reveal a mutation in these genes.        03/18/2018 Surgery    TOTAL LEFT MASTECTOMY WITH AXILLARY SENTINEL LYMPH NODE BIOPSY and  LEFT BREAST RECONSTRUCTION WITH PLACEMENT OF TISSUE EXPANDER AND FLEX HD (ACELLULAR HYDRATED DERMIS) by Dr.  Excell Seltzer and Dr. Iran Planas  03/18/18      03/18/2018 Pathology Results    Diagnosis 03/18/18 1. Lymph node, sentinel, biopsy, Left Axillary #1 - METASTATIC CARCINOMA IN ONE OF ONE LYMPH NODES (1/1). 2. Lymph node, sentinel, biopsy, Left Axillary #2 - ONE OF ONE LYMPH NODES NEGATIVE FOR CARCINOMA (0/1). 3. Breast, simple mastectomy, Left Total - MICROINVASIVE DUCTAL CARCINOMA ARISING IN A BACKGROUND OF LOW GRADE DUCTAL CARCINOMA IN SITU. - DUCTAL CARCINOMA IN SITU PARTIALLY INVOLVES AN INTRADUCTAL PAPILLOMA. - RESECTION MARGINS ARE NEGATIVE FOR INVASIVE CARCINOMA. - IN SITU CARCINOMA IS PRESENT AT THE ANTERIOR MARGIN BROADLY. - BIOPSY SITE. - SEE ONCOLOGY TABLE.       03/18/2018 Receptors her2    Estrogen Receptor: 100%, POSITIVE, MODERATE-WEAK STAINING INTENSITY Progesterone Receptor: 100%, POSITIVE, STRONG STAINING INTENSITY Proliferation Marker Ki67: 1% HER2 - NEGATIVE      03/18/2018 Cancer Staging    Staging form: Breast, AJCC 8th Edition - Pathologic stage from 03/18/2018: Stage IA (pT64m, pN1a, cM0, G1, ER+, PR+, HER2-) - Signed by [name] on 03/28/2018       Measurement of disease:  . N/A  Radiology:  11/15/2014 - CT ABDOMEN AND PELVIS WITH CONTRAST- [facility]E - moderate right hydronephrosis and mild left hydronephrosis. Both ureters are dilated down to the level of the enlarged uterus.  Reproductive: There is marked uterine enlargement, measuring 12 x 19 x 20 centimeters. The uterus contains numerous masses, some of which are calcified. This likely represents a multi fibroid uterus. It is larger than on the ultrasound of 12/18/2009 when it measured 7.4 x 8.6 x 15.6 centimeters  04/29/2018 - TVUS - Uterus Measurements: 17.5 x 15.7 x 18.4 cm. Enlarged nodular uterus containing multiple masses consistent with leiomyomata. Notable lesions include a large posterior LEFT intramural leiomyoma at the mid uterus 8.1 x 6.5 x 7.0 cm, an anterior intramural leiomyoma at the upper  uterine segment 5.5 x 3.8 x 5.3 cm, and a posterior RIGHTintramural leiomyoma 5.6 x 3.7 x 4.0 cm. Numerous additional leiomyomata are seen.Endometrium Inadequately delineated, likely obscured/distorted by the numerous leiomyomata visualized Right ovaryNonvisualized likely due to a combination of displacement by enlarged uterus and obscuration by bowelLeft ovary Nonvisualized likely due to a combination of displacement by enlarged uterus and obscuration by bowel Other findings No free pelvic fluid.  No adnexal masses. IMPRESSION: Enlarged nodular uterus containing numerous leiomyomata as above. Nonvisualization of endometrial complex and ovaries.   Current Meds:  Outpatient Encounter Medications as of 04/29/2018  Medication Sig  . cyclobenzaprine (FLEXERIL) 10 MG tablet Take 10 mg by mouth 3 (three) times daily as needed for muscle spasms.  . ferrous sulfate 325 (65 FE) MG tablet Take 1 tablet (325 mg total) by mouth daily with breakfast.  . lisinopril (PRINIVIL,ZESTRIL) 10 MG tablet Take 1 tablet (10 mg total) by mouth daily.  . Multiple Vitamins-Minerals (WOMENS MULTIVITAMIN PO) Take by mouth.  . oxyCODONE (ROXICODONE) 5 MG immediate release tablet Take 1-2 tablets (5-10 mg total) by mouth every 4 (four) hours as needed.  . venlafaxine XR (EFFEXOR-XR) 75 MG 24 hr capsule Take  1 capsule (75 mg total) by mouth daily with breakfast.  . [DISCONTINUED] eszopiclone (LUNESTA) 1 MG TABS tablet Take 1-2 tablets (1-2 mg total) by mouth at bedtime as needed for sleep. Take immediately before bedtime (Patient not taking: Reported on 04/25/2018)  . [DISCONTINUED] gabapentin (NEURONTIN) 300 MG capsule Take 1 capsule (300 mg total) by mouth at bedtime. (Patient not taking: Reported on 04/20/2018)  . [DISCONTINUED] methocarbamol (ROBAXIN) 500 MG tablet Take 1 tablet (500 mg total) by mouth every 8 (eight) hours as needed for muscle spasms. (Patient not taking: Reported on 04/25/2018)  . [DISCONTINUED] traZODone  (DESYREL) 50 MG tablet Take 1 tablet (50 mg total) by mouth at bedtime as needed for sleep. (Patient not taking: Reported on 04/20/2018)  . [DISCONTINUED] triamcinolone (NASACORT) 55 MCG/ACT AERO nasal inhaler Place 2 sprays into the nose daily. (Patient not taking: Reported on 04/20/2018)  . [DISCONTINUED] zolpidem (AMBIEN) 5 MG tablet Take 1 tablet (5 mg total) by mouth at bedtime as needed for sleep. (Patient not taking: Reported on 04/20/2018)   No facility-administered encounter medications on file as of 04/29/2018.     Allergy: No Known Allergies  Past Surgical Hx:  Past Surgical History:  Procedure Laterality Date  . AXILLARY SURGERY    . BREAST RECONSTRUCTION WITH PLACEMENT OF TISSUE EXPANDER AND FLEX HD (ACELLULAR HYDRATED DERMIS) Left 03/18/2018   Procedure: LEFT BREAST RECONSTRUCTION WITH PLACEMENT OF TISSUE EXPANDER AND FLEX HD (ACELLULAR HYDRATED DERMIS);  Surgeon: [name], MD;  Location: [place];  Service: Plastics;  Laterality: Left;  . KNEE ARTHROSCOPY Left 2013  . MASTECTOMY W/ SENTINEL NODE BIOPSY Left 03/18/2018   Procedure: TOTAL LEFT MASTECTOMY WITH AXILLARY SENTINEL LYMPH NODE BIOPSY;  Surgeon: Excell Seltzer, MD;  Location: [place];  Service: General;  Laterality: Left;  . TUBAL LIGATION     Past Medical Hx:  Past Medical History:  Diagnosis Date  . Anemia   . Anxiety   . 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 Family history of breast cancer   . Family history of ovarian cancer   . Genetic testing 02/21/2018   STAT Breast panel with reflex to Breast/GYN panel (23 genes) @ [facility] - No pathogenic mutations detected  . Hypertension   . PONV (postoperative nausea and vomiting)   . Uterine fibroid    Past Gynecological History:   GYNECOLOGIC HISTORY:  Patient's last menstrual period was 11/17/2017. Menarche: 47 years old P 3 LMP Mar 14, 2018 Contraceptive took OCP in past HRT none  Last Pap  states 2019  Family Hx:  Family History  Problem Relation Age of Onset  . Hypertension Mother   . Ovarian cancer Maternal Grandmother        dx 87s; deceased 34s  . Breast cancer Cousin        dx 12s; currently 34s; daughter of matenral uncle  . Leukemia Maternal Aunt        currently 17   Social Hx:  Tobacco use: none Alcohol use: 1/ week Illicit Drug use: none Illicit IV Drug use: denies  Review of Systems:  Review of Systems  Endocrine: Positive for hot flashes.  Psychiatric/Behavioral: Positive for depression. The patient is nervous/anxious.   All other systems reviewed and are negative.   Vitals:  Blood pressure (!) 142/88, pulse (!) 127, temperature 98.3 F (36.8 C), temperature source Oral, resp. rate 20, height _0  (1.702 m), weight 170 lb 14.4 oz (77.5 kg), last menstrual  period 11/17/2017, SpO2 100 %. Body mass index is 26.77 kg/m.   Physical Exam: ECOG PERFORMANCE STATUS: 0 - Asymptomatic  General :  Well developed, 47 y.o., female in no apparent distress HEENT:  Normocephalic/atraumatic, symmetric, EOMI, eyelids normal Neck:   No visible masses.  Respiratory:  Respirations unlabored, no use of accessory muscles CV:   Deferred Breast:  Deferred Musculoskeletal: Normal muscle strength. Abdomen:  No visible masses or protrusion Extremities:  No visible edema or deformities Skin:   Normal inspection Neuro/Psych:  No focal motor deficit, no abnormal mental status. Normal gait. Normal affect. Alert and oriented to person, place, and time  Genitourinary: Deferred as done by Dr. Ilda Basset recently     Assessment H/o breast cancer  fibroids  Plan: 1. Reassurance was provided today. 2. Ovarian discussion o Discussed removal of ovaries would be for her breast cancer benefit, not because we were trying to decrease her risk of ovarian ca. I would defer this discussion to Dr. Burr Medico i. Recommended discussing BSO and/or GnRH with Dr. Burr Medico o No good screening for  ovarian cancer. o The family history of ovarian cancer is not 100% convincing since surgery was never pursued. It's hard to know what Gyn etiology may have been present in her M-grandmother o Reassuring is her negative genetic testing. 3. Uterine fibroids o Agree with Dr. Ilda Basset that fibroids should shrink with menopause.  i. Not sure if tamoxifen will alter that natural course. Strong studies are lacking although there is data showing they may shrink.  o I wouldn't screen for uterine cancer unless presents with AUB.  o EMB reassuring although scant and may not have fully sampled endometrial lining (not unexpected if the fibroids impinge on the endometrial canal) which may be an issue going forward if she needs to be sampled again. o I don't think she needs an EMB with every menstrual event. If however she has an indication for sampling (ie AUB) then I would consider D&C and potentially offering hysterectomy at that time reasonable. Right now she's not having any bleeding issues to justify morbidity of surgery. 4. Tamoxifen use o We did review increase risk of uterine CA with Tamoxifen o Explained we don't prophylacticaly perform hy  Disposition back to Dr. Ilda Basset. Re-refer as needed. No exam today was just a discussion  Isabel Caprice, MD  04/29/2018, 1:26 PM    Cc: Aletha Halim, MD (Referring Ob/Gyn) Truitt Merle, MD (Medical Oncologist) Karle Plumber, MD (PCP)

## 2018-04-28 ENCOUNTER — Ambulatory Visit (HOSPITAL_COMMUNITY): Payer: Medicaid Other

## 2018-04-28 ENCOUNTER — Telehealth: Payer: Self-pay | Admitting: *Deleted

## 2018-04-28 NOTE — Telephone Encounter (Signed)
Please see my note from today 04/28/18.  Results given.

## 2018-04-28 NOTE — Telephone Encounter (Signed)
Spoke with Dr. Ilda Basset.  Spoke with patient by phone and explained I had spoken with Dr. Ilda Basset and he wouldn't be able to contact her himself and he apologizes for that.  "He did request that I speak with you again to let you know the test does look benign".  I encouraged patient to make a note of any questions she might have to carry with her to the appointment with Dr. Gerarda Fraction in [place] tomorrow.  Patient states understanding.  I told patient Dr. Ilda Basset had released the result to her My Chart account.  I asked if she had seen that and she states she has.  No further questions.

## 2018-04-28 NOTE — Telephone Encounter (Signed)
Received a call from [place], [place] with [facility].  States patient's ultrasound is scheduled for tomorrow after her appointment with the GYN ONC.  Wants U/S moved up so they have results prior to appointment.    Spoke with U/S.  Vance Peper rescheduled patient's u/s for tomorrow morning 04/29/18 at 8 am.  Patient's GYN ONC appointment is scheduled for 1100 am tomorrow.    Phoned patient to make her aware of the u/s appointment change.  Explained to patient she must have a full bladder for this study.  Patient states understanding.   Patient requests results from biopsy performed by Dr. Ilda Basset.  States she hasn't seen it in My Chart yet.  I looked at result and gave patient a preliminary result.  I stated I am a nurse and not a doctor but the result does say "benign".  I explained to patient that Dr. Ilda Basset will be in the office this afternoon and he has probably planned to contact her this afternoon.  I explained I will definitely speak to him and let him know that she would like a return call and results released to her My Chart account.    Patient states she is very nervous and worried about the results.  States she was wondering if she would just get the results at her appointment tomorrow.  I again assured her I will speak with Dr. Ilda Basset myself and if for some reason he can't call her that I would get more information from him regarding the results and I will call her back today.    She has many questions and concerns.  I explained the [facility] physician is the expert and has access to all the results of tests and notes from her visits here in our office.  I explained all of the providers she has seen and will see tomorrow are great and will answer her questions.  Patient states understanding and says she will await our call this afternoon.

## 2018-04-29 ENCOUNTER — Inpatient Hospital Stay: Payer: Medicaid Other | Attending: Hematology | Admitting: Obstetrics

## 2018-04-29 ENCOUNTER — Ambulatory Visit (HOSPITAL_COMMUNITY): Payer: Medicaid Other

## 2018-04-29 ENCOUNTER — Encounter: Payer: Self-pay | Admitting: Obstetrics

## 2018-04-29 ENCOUNTER — Ambulatory Visit (HOSPITAL_COMMUNITY)
Admission: RE | Admit: 2018-04-29 | Discharge: 2018-04-29 | Disposition: A | Discharge status: Home / Self Care | Payer: Medicaid Other | Source: Ambulatory Visit | Attending: Obstetrics and Gynecology | Admitting: Obstetrics and Gynecology

## 2018-04-29 ENCOUNTER — Encounter: Payer: Self-pay | Admitting: *Deleted

## 2018-04-29 VITALS — BP 142/88 | HR 127 | Temp 98.3°F | Resp 20 | Ht 67.0 in | Wt 170.9 lb

## 2018-04-29 DIAGNOSIS — N852 Hypertrophy of uterus: Secondary | ICD-10-CM | POA: Insufficient documentation

## 2018-04-29 DIAGNOSIS — Z79810 Long term (current) use of selective estrogen receptor modulators (SERMs): Secondary | ICD-10-CM | POA: Insufficient documentation

## 2018-04-29 DIAGNOSIS — N951 Menopausal and female climacteric states: Secondary | ICD-10-CM | POA: Diagnosis not present

## 2018-04-29 DIAGNOSIS — C50412 Malignant neoplasm of upper-outer quadrant of left female breast: Secondary | ICD-10-CM | POA: Diagnosis not present

## 2018-04-29 DIAGNOSIS — N858 Other specified noninflammatory disorders of uterus: Secondary | ICD-10-CM | POA: Diagnosis not present

## 2018-04-29 DIAGNOSIS — D259 Leiomyoma of uterus, unspecified: Secondary | ICD-10-CM | POA: Diagnosis present

## 2018-04-29 DIAGNOSIS — D219 Benign neoplasm of connective and other soft tissue, unspecified: Secondary | ICD-10-CM

## 2018-04-29 NOTE — Patient Instructions (Signed)
Plan to follow up with Dr. Ilda Basset.  Please call for any questions or concerns.

## 2018-05-03 ENCOUNTER — Ambulatory Visit
Admission: RE | Admit: 2018-05-03 | Discharge: 2018-05-03 | Disposition: A | Discharge status: Home / Self Care | Payer: Medicaid Other | Source: Ambulatory Visit | Attending: Radiation Oncology | Admitting: Radiation Oncology

## 2018-05-03 DIAGNOSIS — C50412 Malignant neoplasm of upper-outer quadrant of left female breast: Secondary | ICD-10-CM | POA: Insufficient documentation

## 2018-05-03 DIAGNOSIS — Z510 Encounter for antineoplastic radiation therapy: Secondary | ICD-10-CM | POA: Diagnosis not present

## 2018-05-03 DIAGNOSIS — Z170 Estrogen receptor positive status [ER+]: Secondary | ICD-10-CM | POA: Diagnosis not present

## 2018-05-03 NOTE — Progress Notes (Signed)
  Radiation Oncology         [phone]) [phone] ________________________________  Name: LEVADA BOWERSOX MRN: [id number]  Date: 05/03/2018  DOB: [birth year]  Optical Surface Tracking Plan:  Since intensity modulated radiotherapy (IMRT) and 3D conformal radiation treatment methods are predicated on accurate and precise positioning for treatment, intrafraction motion monitoring is medically necessary to ensure accurate and safe treatment delivery.  The ability to quantify intrafraction motion without excessive ionizing radiation dose can only be performed with optical surface tracking. Accordingly, surface imaging offers the opportunity to obtain 3D measurements of patient position throughout IMRT and 3D treatments without excessive radiation exposure.  I am ordering optical surface tracking for this patient's upcoming course of radiotherapy. ________________________________  Gery Pray, MD 05/03/2018 7:19 PM    Reference:   Ursula Alert, J, et al. Surface imaging-based analysis of intrafraction motion for breast radiotherapy patients.Journal of [place], n. 6, nov. 2014. ISSN [id number].   Available at: <http://www.jacmp.org/index.php/jacmp/article/view/4957>.

## 2018-05-03 NOTE — Progress Notes (Signed)
  Radiation Oncology         [phone] ________________________________  Name: KAMARIAH FRUCHTER MRN: [id number]  Date: 05/03/2018  DOB: 28-Dec-1970  SIMULATION AND TREATMENT PLANNING NOTE    ICD-10-CM   1. Malignant neoplasm of upper-outer quadrant of left breast in female, estrogen receptor positive ([place]) C50.412    Z17.0     DIAGNOSIS:   47 y.o. female with Clinical stage0 (Tis, Nx), Pathologic Stage IA (pT62m, pN1a)LeftBreast UOQMicroinvasive Ductal Carcinoma,ER(+)/ PR(+)/ Her2 (-),Grade 1    NARRATIVE:  The patient was brought to the [facility]  Identity was confirmed.  All relevant records and images related to the planned course of therapy were reviewed.  The patient freely provided informed written consent to proceed with treatment after reviewing the details related to the planned course of therapy. The consent form was witnessed and verified by the simulation staff.  Then, the patient was set-up in a stable reproducible  supine position for radiation therapy.  CT images were obtained.  Surface markings were placed.  The CT images were loaded into the planning software.  Then the target and avoidance structures were contoured.  Treatment planning then occurred.  The radiation prescription was entered and confirmed.  Then, I designed and supervised the construction of a total of 5 medically necessary complex treatment devices.  I have requested : 3D Simulation  I have requested a DVH of the following structures: reconstructed breast, heart, lungs.  I have ordered:CBC  PLAN:  The patient will receive 50.4 Gy in 28 fractions directed at the left chest wall area. The axillary area will receive 45 gray in 25 fractions. Bolus every other day will be used along the chest wall to ensure adequate dose to the superficial aspects of the target area. No scar boost is planned as the mastectomy scar cannot be identified in light of her reconstructive  surgery.  -----------------------------------  JBlair Promise PhD, MD

## 2018-05-10 ENCOUNTER — Ambulatory Visit: Payer: Medicaid Other | Admitting: Radiation Oncology

## 2018-05-10 ENCOUNTER — Telehealth: Payer: Self-pay | Admitting: General Practice

## 2018-05-10 NOTE — Telephone Encounter (Signed)
Patient called and left message on nurse voicemail line stating she had an endometrial biopsy earlier this month. Patient states since then she has noticed an odor like she usually gets whenever she has BV. Patient requests call back.  Called patient, no answer- left message stating we are trying to reach you to return your phone call, we have received your voicemail message. Please call us back.

## 2018-05-11 ENCOUNTER — Ambulatory Visit: Payer: Medicaid Other | Attending: Radiation Oncology | Admitting: Rehabilitation

## 2018-05-11 ENCOUNTER — Ambulatory Visit: Payer: Medicaid Other

## 2018-05-11 ENCOUNTER — Other Ambulatory Visit: Payer: Self-pay

## 2018-05-11 ENCOUNTER — Encounter: Payer: Self-pay | Admitting: Rehabilitation

## 2018-05-11 DIAGNOSIS — Z483 Aftercare following surgery for neoplasm: Secondary | ICD-10-CM | POA: Insufficient documentation

## 2018-05-11 DIAGNOSIS — M25612 Stiffness of left shoulder, not elsewhere classified: Secondary | ICD-10-CM | POA: Diagnosis present

## 2018-05-11 DIAGNOSIS — D0512 Intraductal carcinoma in situ of left breast: Secondary | ICD-10-CM | POA: Insufficient documentation

## 2018-05-11 NOTE — Therapy (Signed)
[place], [place], [address] Phone: [phone]   Fax:  [phone]  Physical Therapy Evaluation  Patient Details  Name: Casey Baker MRN: [id number] Date of Birth: 1970-11-19 Referring Provider: Dr. Gery Pray   Encounter Date: 05/11/2018  PT End of Session - 05/11/18 1604    Visit Number  1    Number of Visits  4    Date for PT Re-Evaluation  06/15/18    Authorization Type  Medicaid    PT Start Time  7262    PT Stop Time  1600 pt had to leave right at 4 and we started late    PT Time Calculation (min)  30 min    Activity Tolerance  Patient tolerated treatment well    Behavior During Therapy  [facility] for tasks assessed/performed       Past Medical History:  Diagnosis Date  . Anemia   . Anxiety   . 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 Family history of breast cancer   . Family history of ovarian cancer   . Genetic testing 02/21/2018   STAT Breast panel with reflex to Breast/GYN panel (23 genes) @ [facility] - No pathogenic mutations detected  . Hypertension   . PONV (postoperative nausea and vomiting)   . Uterine fibroid     Past Surgical History:  Procedure Laterality Date  . AXILLARY SURGERY    . BREAST RECONSTRUCTION WITH PLACEMENT OF TISSUE EXPANDER AND FLEX HD (ACELLULAR HYDRATED DERMIS) Left 03/18/2018   Procedure: LEFT BREAST RECONSTRUCTION WITH PLACEMENT OF TISSUE EXPANDER AND FLEX HD (ACELLULAR HYDRATED DERMIS);  Surgeon: [name], MD;  Location: [facility];  Service: Plastics;  Laterality: Left;  . KNEE ARTHROSCOPY Left 2013  . MASTECTOMY W/ SENTINEL NODE BIOPSY Left 03/18/2018   Procedure: TOTAL LEFT MASTECTOMY WITH AXILLARY SENTINEL LYMPH NODE BIOPSY;  Surgeon: Excell Seltzer, MD;  Location: [facility];  Service: General;  Laterality: Left;  . TUBAL LIGATION      There were no vitals filed for this  visit.   Subjective Assessment - 05/11/18 1530    Subjective  Having some ROM issues Lt shoulder.  Starting radiation next week.  Denies swelling    Pertinent History  Lt Mastectomy with expander placement 03/18/18 due to DCIS, ALND 2, 1/2 positive, not needing chemotherapy     Patient Stated Goals  improve Lt shoulder ROM, hard to scratch the back,     Currently in Pain?  No/denies         [facility] PT Assessment - 05/11/18 0001      Assessment   Medical Diagnosis  Lt mastectomy due to DCIS    Referring Provider  Dr. Gery Pray    Onset Date/Surgical Date  03/18/18    Hand Dominance  Right    Prior Therapy  no      Precautions   Precaution Comments  cancer      Restrictions   Weight Bearing Restrictions  No      Balance Screen   Has the patient fallen in the past 6 months  No    Has the patient had a decrease in activity level because of a fear of falling?   No    Is the patient reluctant to leave their home because of a fear of falling?   No      Home Film/video editor residence  Prior Function   Level of Independence  Independent    Vocation  Unemployed    Leisure  water aerobics      Cognition   Overall Cognitive Status  Within Functional Limits for tasks assessed      Observation/Other Assessments   Skin Integrity  still healing incision from center of breast distally to chest wall      ROM / Strength   AROM / PROM / Strength  AROM;PROM;Strength      AROM   Overall AROM Comments  notes uncomfortable Lt shoulder    AROM Assessment Site  Shoulder    Right/Left Shoulder  Right;Left    Right Shoulder Flexion  150 Degrees    Right Shoulder ABduction  160 Degrees    Right Shoulder Internal Rotation  -- behind the back to T10    Right Shoulder External Rotation  80 Degrees    Right Shoulder Horizontal ABduction  40 Degrees    Left Shoulder Flexion  145 Degrees    Left Shoulder ABduction  170 Degrees    Left Shoulder Internal Rotation   -- Behind the back to T10    Left Shoulder External Rotation  90 Degrees    Left Shoulder Horizontal ABduction  30 Degrees      PROM   PROM Assessment Site  Shoulder    Right/Left Shoulder  Right;Left      Strength   Overall Strength  Within functional limits for tasks performed    Overall Strength Comments  strong shoulder MMT bilateral        LYMPHEDEMA/ONCOLOGY QUESTIONNAIRE - 05/11/18 1544      Type   Cancer Type  Lt breast DCIS      Surgeries   Mastectomy Date  03/18/18    Sentinel Lymph Node Biopsy Date  03/18/18    Number Lymph Nodes Removed  2 1/2 positive      Treatment   Active Chemotherapy Treatment  No    Past Chemotherapy Treatment  No    Active Radiation Treatment  No starts next week    Past Radiation Treatment  No    Current Hormone Treatment  No    Past Hormone Therapy  No      What other symptoms do you have   Are you Having Heaviness or Tightness  No    Are you having Pain  No    Are you having pitting edema  No    Is it Hard or Difficult finding clothes that fit  No    Do you have infections  No    Is there Decreased scar mobility  Yes      Lymphedema Assessments   Lymphedema Assessments  Upper extremities      Right Upper Extremity Lymphedema   15 cm Proximal to Olecranon Process  31 cm    10 cm Proximal to Olecranon Process  30 cm    Olecranon Process  26.5 cm    15 cm Proximal to Ulnar Styloid Process  24.5 cm    10 cm Proximal to Ulnar Styloid Process  20 cm    Just Proximal to Ulnar Styloid Process  16.5 cm    Across Hand at [employer]  20.5 cm    At [place] of 2nd Digit  6.4 cm      Left Upper Extremity Lymphedema   15 cm Proximal to Olecranon Process  31 cm    10 cm Proximal to Olecranon Process  31.2 cm  Olecranon Process  26.5 cm    15 cm Proximal to Ulnar Styloid Process  24.1 cm    10 cm Proximal to Ulnar Styloid Process  20.5 cm    Just Proximal to Ulnar Styloid Process  16.2 cm    Across Hand at [employer]  19 cm     At [place] of 2nd Digit  6.1 cm          Quick Dash - 05/11/18 0001    Open a tight or new jar  Moderate difficulty    Do heavy household chores (wash walls, wash floors)  No difficulty    Carry a shopping bag or briefcase  Mild difficulty    Wash your back  Moderate difficulty    Use a knife to cut food  No difficulty    Recreational activities in which you take some force or impact through your arm, shoulder, or hand (golf, hammering, tennis)  Mild difficulty    During the past week, to what extent has your arm, shoulder or hand problem interfered with your normal social activities with family, friends, neighbors, or groups?  Slightly    During the past week, to what extent has your arm, shoulder or hand problem limited your work or other regular daily activities  Slightly    Arm, shoulder, or hand pain.  Mild    Tingling (pins and needles) in your arm, shoulder, or hand  None    Difficulty Sleeping  Severe difficulty    DASH Score  27.27 %        Objective measurements completed on examination: See above findings.              PT Education - 05/11/18 1604    Education Details  POC, what is lymphedema, lymphedema preacautions handout    Person(s) Educated  Patient    Methods  Explanation;Handout    Comprehension  Verbalized understanding       PT Short Term Goals - 05/11/18 1614      PT SHORT TERM GOAL #1   Title  Pt will be able to tolerate radiation position x 15-57minutes subjectively     Time  2    Period  Weeks    Status  New    Target Date  05/25/18      PT SHORT TERM GOAL #2   Title  Pt will improve Lt shoulder AROM to flex: 170, abduction 170    Time  2    Period  Weeks    Status  New    Target Date  05/25/18      PT SHORT TERM GOAL #3   Title  pt will be knowledgeable about lymphedema precautions including where to get a compresison sleeve/gauntlet    Time  2    Period  Weeks    Status  New    Target Date  05/25/18                 Plan - 05/11/18 1607    Clinical Impression Statement  Casey Baker presents to PT today with mildly decreased shoulder AROM and lack of motion needed for radiation positioning.  She reports that she will not do exercises on her own and would like to have some help until independent.  She should be approved 3 visits initially.  ROM is only mildly decreased into flexion, ER, abduction, and horizontal abduction.  Strength was WNL during MMT but she reports not using the arm much since surgery.  She has expanders placed at this time and will be starting radiation next week.  Educated on lymphedema and that she can get a prophylactic sleeve at this time and she expressed interest.  Referral sent to MD     Clinical Presentation  Evolving    Clinical Presentation due to:  post op    Clinical Decision Making  Moderate    Rehab Potential  Excellent    Clinical Impairments Affecting Rehab Potential  visit limitations    PT Frequency  -- 1-2x per week    PT Duration  2 weeks    PT Treatment/Interventions  ADLs/Self Care Home Management;Patient/family education;Manual techniques;Passive range of motion;Therapeutic exercise    PT Next Visit Plan  Lt shoulder stretching and PROM to tolerance.  Teach some AAROM for the Lt shoulder.  See if referral is back for compression sleeve    Consulted and Agree with Plan of Care  Patient       Patient will benefit from skilled therapeutic intervention in order to improve the following deficits and impairments:  Decreased knowledge of use of DME, Decreased range of motion, Decreased activity tolerance, Decreased knowledge of precautions  Visit Diagnosis: Ductal carcinoma in situ (DCIS) of left breast  Stiffness of left shoulder, not elsewhere classified  Aftercare following surgery for neoplasm     Problem List Patient Active Problem List   Diagnosis Date Noted  . Breast cancer, left breast ([place]) 03/18/2018  . Genetic testing 02/21/2018  .  Family history of breast cancer   . Family history of ovarian cancer   . Iron deficiency anemia due to chronic blood loss 02/09/2018  . Malignant neoplasm of upper-outer quadrant of left female breast ([place]) 02/03/2018  . Breast wound, left, subsequent encounter 01/12/2018  . Hypertension   . Anxiety with depression   . Uterine fibroid 09/29/2014  . Menorrhagia 09/29/2014  . Anemia 09/29/2014  . Sinus tachycardia     Shan Levans, PT 05/11/2018, 4:16 PM  [place], [place], [address] Phone: [phone]   Fax:  [phone]  Name: Casey Baker MRN: [id number] Date of Birth: [birth year]

## 2018-05-12 ENCOUNTER — Ambulatory Visit: Payer: Medicaid Other

## 2018-05-12 DIAGNOSIS — Z510 Encounter for antineoplastic radiation therapy: Secondary | ICD-10-CM | POA: Diagnosis not present

## 2018-05-13 ENCOUNTER — Ambulatory Visit: Payer: Medicaid Other

## 2018-05-16 ENCOUNTER — Ambulatory Visit: Payer: Medicaid Other

## 2018-05-17 ENCOUNTER — Ambulatory Visit
Admission: RE | Admit: 2018-05-17 | Discharge: 2018-05-17 | Disposition: A | Discharge status: Home / Self Care | Payer: Medicaid Other | Source: Ambulatory Visit | Attending: Radiation Oncology | Admitting: Radiation Oncology

## 2018-05-17 ENCOUNTER — Ambulatory Visit: Payer: Medicaid Other

## 2018-05-17 DIAGNOSIS — Z170 Estrogen receptor positive status [ER+]: Principal | ICD-10-CM

## 2018-05-17 DIAGNOSIS — C50412 Malignant neoplasm of upper-outer quadrant of left female breast: Secondary | ICD-10-CM

## 2018-05-17 DIAGNOSIS — Z510 Encounter for antineoplastic radiation therapy: Secondary | ICD-10-CM | POA: Diagnosis not present

## 2018-05-17 NOTE — Progress Notes (Signed)
  Radiation Oncology         [phone] ________________________________  Name: Casey Baker MRN: [id number]  Date: 05/17/2018  DOB: 04-15-1971  Simulation Verification Note    ICD-10-CM   1. Malignant neoplasm of upper-outer quadrant of left breast in female, estrogen receptor positive ([place]) C50.412    Z17.0     Status: outpatient  NARRATIVE: The patient was brought to the treatment unit and placed in the planned treatment position. The clinical setup was verified. Then port films were obtained and uploaded to the radiation oncology medical record software.  The treatment beams were carefully compared against the planned radiation fields. The position location and shape of the radiation fields was reviewed. They targeted volume of tissue appears to be appropriately covered by the radiation beams. Organs at risk appear to be excluded as planned.  Based on my personal review, I approved the simulation verification. The patient's treatment will proceed as planned.  -----------------------------------  Blair Promise, PhD, MD

## 2018-05-18 ENCOUNTER — Ambulatory Visit
Admission: RE | Admit: 2018-05-18 | Discharge: 2018-05-18 | Disposition: A | Discharge status: Home / Self Care | Payer: Medicaid Other | Source: Ambulatory Visit | Attending: Radiation Oncology | Admitting: Radiation Oncology

## 2018-05-18 ENCOUNTER — Ambulatory Visit: Payer: Medicaid Other | Admitting: Physical Therapy

## 2018-05-18 ENCOUNTER — Ambulatory Visit: Payer: Medicaid Other

## 2018-05-18 ENCOUNTER — Encounter: Payer: Self-pay | Admitting: Physical Therapy

## 2018-05-18 DIAGNOSIS — Z483 Aftercare following surgery for neoplasm: Secondary | ICD-10-CM

## 2018-05-18 DIAGNOSIS — D0512 Intraductal carcinoma in situ of left breast: Secondary | ICD-10-CM

## 2018-05-18 DIAGNOSIS — Z170 Estrogen receptor positive status [ER+]: Principal | ICD-10-CM

## 2018-05-18 DIAGNOSIS — M25612 Stiffness of left shoulder, not elsewhere classified: Secondary | ICD-10-CM

## 2018-05-18 DIAGNOSIS — C50412 Malignant neoplasm of upper-outer quadrant of left female breast: Secondary | ICD-10-CM

## 2018-05-18 DIAGNOSIS — Z510 Encounter for antineoplastic radiation therapy: Secondary | ICD-10-CM | POA: Diagnosis not present

## 2018-05-18 MED ORDER — RADIAPLEXRX EX GEL
Freq: Once | CUTANEOUS | Status: AC
  Administered 2018-05-18: 10:00:00 via TOPICAL

## 2018-05-18 MED ORDER — ALRA NON-METALLIC DEODORANT (RAD-ONC)
1.0000 "application " | Freq: Once | TOPICAL | Status: AC
  Administered 2018-05-18: 1 via TOPICAL

## 2018-05-18 NOTE — Progress Notes (Signed)
Pt here for patient teaching.  Pt given Radiation and You booklet, skin care instructions, Alra deodorant and Radiaplex gel.  Reviewed areas of pertinence such as fatigue, hair loss, skin changes, breast tenderness and breast swelling . Pt able to give teach back of to pat skin and use unscented/gentle soap,apply Radiaplex bid, avoid applying anything to skin within 4 hours of treatment, avoid wearing an under wire bra and to use an electric razor if they must shave. Pt demonstrated understanding and verbalizes understanding of information given and will contact nursing with any questions or concerns.     Http://rtanswers.org/treatmentinformation/whattoexpect/index  Ataya Murdy W Braelynn Benning, RN BSN       

## 2018-05-18 NOTE — Patient Instructions (Signed)
Flexion (Assistive)    Clasp hands together and raise arms above head, keeping elbows as straight as possible. Can be done sitting OR walk your fingers up the wall like we did in the clinic.  Repeat _10___ times. Do _2___ sessions per day. Sprinkle the stretch into your day is probably most beneficial.    SHOULDER: Abduction Bilateral    Raise arms out and up at same speed with palms up. Keep elbows straight. Do not shrug shoulders. __10_ reps per set, __2_ sets per day, OR walk your fingers up the wall standing sideways. 10x   Copyright  VHI. All rights reserved.    Copyright  VHI. All rights reserved.   Internal Rotation (Active)    Bring hand behind back and across to other side. You can pull gently with the opposite ( RT) hand to give it an extra stretch.  Repeat __1-3__ times. Do __many__ sessions per day. Activity: Use this motion when getting dressed to tuck shirttail or fasten brassiere.    External Rotation    Stand or lay down with hands clasped behind head. Pull elbows back as far as possible. Hold _10__ seconds. Repeat __1-3__ times. Do __1-3__ sessions per day.  Copyright  VHI. All rights reserved.    Copyright  VHI. All rights reserved.

## 2018-05-18 NOTE — Therapy (Signed)
[place], [place], [address] Phone: [phone]   Fax:  [phone]  Physical Therapy Treatment  Patient Details  Name: Casey Baker MRN: [id number] Date of Birth: [birth year] Referring Provider: Dr. Gery Pray   Encounter Date: 05/18/2018  PT End of Session - 05/18/18 0810    Visit Number  2    Number of Visits  4    Date for PT Re-Evaluation  06/15/18    Authorization Type  Medicaid    PT Start Time  0806 PT late and wanted to leave early to get to radiation since it was her first time    PT Stop Time  0838    PT Time Calculation (min)  32 min    Activity Tolerance  Patient tolerated treatment well    Behavior During Therapy  [facility] for tasks assessed/performed       Past Medical History:  Diagnosis Date  . Anemia   . Anxiety   . 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 Family history of breast cancer   . Family history of ovarian cancer   . Genetic testing 02/21/2018   STAT Breast panel with reflex to Breast/GYN panel (23 genes) @ [facility] - No pathogenic mutations detected  . Hypertension   . PONV (postoperative nausea and vomiting)   . Uterine fibroid     Past Surgical History:  Procedure Laterality Date  . AXILLARY SURGERY    . BREAST RECONSTRUCTION WITH PLACEMENT OF TISSUE EXPANDER AND FLEX HD (ACELLULAR HYDRATED DERMIS) Left 03/18/2018   Procedure: LEFT BREAST RECONSTRUCTION WITH PLACEMENT OF TISSUE EXPANDER AND FLEX HD (ACELLULAR HYDRATED DERMIS);  Surgeon: [name], MD;  Location: [facility];  Service: Plastics;  Laterality: Left;  . KNEE ARTHROSCOPY Left 2013  . MASTECTOMY W/ SENTINEL NODE BIOPSY Left 03/18/2018   Procedure: TOTAL LEFT MASTECTOMY WITH AXILLARY SENTINEL LYMPH NODE BIOPSY;  Surgeon: Excell Seltzer, MD;  Location: [facility];  Service: General;  Laterality: Left;  . TUBAL LIGATION      There were no  vitals filed for this visit.  Subjective Assessment - 05/18/18 0813    Subjective  I might not want to come back.     Pertinent History  Lt Mastectomy with expander placement 03/18/18 due to DCIS, ALND 2, 1/2 positive, not needing chemotherapy     Currently in Pain?  No/denies    Multiple Pain Sites  No         [facility] PT Assessment - 05/18/18 0001      AROM   Left Shoulder Flexion  150 Degrees    Left Shoulder ABduction  170 Degrees    Left Shoulder Internal Rotation  -- Behind the back to T10    Left Shoulder External Rotation  90 Degrees                   [facility] Adult PT Treatment/Exercise - 05/18/18 0001      Self-Care   Self-Care  Other Self-Care Comments    Other Self-Care Comments   lymphadema precaution review, why wear compression sleeve, where to buy/purchase             PT Education - 05/18/18 0813    Education Details  HEP    Person(s) Educated  Patient    Methods  Explanation;Demonstration;Tactile cues;Verbal cues;Handout    Comprehension  Returned demonstration;Verbalized understanding       PT Short Term Goals -  05/18/18 0832      PT SHORT TERM GOAL #1   Title  Pt will be able to tolerate radiation position x 15-20minutes subjectively     Time  2    Period  Weeks    Status  Achieved Started radiation today      PT SHORT TERM GOAL #2   Title  Pt will improve Lt shoulder AROM to flex: 170, abduction 170    Time  2    Period  Weeks    Status  Partially Met Met abduction, improved flexion but not yet to 170 degrees      PT SHORT TERM GOAL #3   Title  pt will be knowledgeable about lymphedema precautions including where to get a compresison sleeve/gauntlet    Time  2    Period  Weeks    Status  Achieved               Plan - 05/18/18 0810    Clinical Impression Statement  Pt presents with improved AROM. She reports she might not want to come back after getting HEP today since she feels really good. In fact, she reports after  the eval she can lay on her left side with no discomfort. She felt the moving of her arm was very helpful. She was instructed in HEP for AAROM and static stretches for left shoulder in all planes. Pt demonstrated them all correclty with no difficulty.   We reviewed lymphadema precautions and explained the process of obtaining a compression sleeve. She gave us verbal permission to fax the order for compression garment when we receive it.  Pt has met most of her goals as of today.     Rehab Potential  Excellent    Clinical Impairments Affecting Rehab Potential  visit limitations    PT Duration  2 weeks    PT Treatment/Interventions  ADLs/Self Care Home Management;Patient/family education;Manual techniques;Passive range of motion;Therapeutic exercise    PT Next Visit Plan  Potential DC. Pt wil call tomorrow if she wants to proceed with discharge. Currently she has an appointment this Friday.     Consulted and Agree with Plan of Care  Patient       Patient will benefit from skilled therapeutic intervention in order to improve the following deficits and impairments:  Decreased knowledge of use of DME, Decreased range of motion, Decreased activity tolerance, Decreased knowledge of precautions  Visit Diagnosis: Ductal carcinoma in situ (DCIS) of left breast  Stiffness of left shoulder, not elsewhere classified  Aftercare following surgery for neoplasm     Problem List Patient Active Problem List   Diagnosis Date Noted  . Breast cancer, left breast ([facility]) 03/18/2018  . Genetic testing 02/21/2018  . Family history of breast cancer   . Family history of ovarian cancer   . Iron deficiency anemia due to chronic blood loss 02/09/2018  . Malignant neoplasm of upper-outer quadrant of left female breast ([facility]) 02/03/2018  . Breast wound, left, subsequent encounter 01/12/2018  . Hypertension   . Anxiety with depression   . Uterine fibroid 09/29/2014  . Menorrhagia 09/29/2014  . Anemia 09/29/2014  .  Sinus tachycardia     COCHRAN,JENNIFER, PTA 05/18/2018, 8:44 AM  [facility] [address] [place], [place], [address] Phone: [phone]   Fax:  [phone]  Name: Kemisha L Tuch MRN: [id number] Date of Birth: 10/17/1971   

## 2018-05-19 ENCOUNTER — Ambulatory Visit
Admission: RE | Admit: 2018-05-19 | Discharge: 2018-05-19 | Disposition: A | Discharge status: Home / Self Care | Payer: Medicaid Other | Source: Ambulatory Visit | Attending: Radiation Oncology | Admitting: Radiation Oncology

## 2018-05-19 ENCOUNTER — Ambulatory Visit: Payer: Medicaid Other

## 2018-05-19 DIAGNOSIS — Z170 Estrogen receptor positive status [ER+]: Secondary | ICD-10-CM | POA: Diagnosis not present

## 2018-05-19 DIAGNOSIS — C50412 Malignant neoplasm of upper-outer quadrant of left female breast: Secondary | ICD-10-CM | POA: Diagnosis not present

## 2018-05-19 DIAGNOSIS — Z510 Encounter for antineoplastic radiation therapy: Secondary | ICD-10-CM | POA: Diagnosis present

## 2018-05-20 ENCOUNTER — Ambulatory Visit: Payer: Medicaid Other | Attending: Radiation Oncology | Admitting: Physical Therapy

## 2018-05-20 ENCOUNTER — Ambulatory Visit: Payer: Medicaid Other

## 2018-05-20 ENCOUNTER — Ambulatory Visit
Admission: RE | Admit: 2018-05-20 | Discharge: 2018-05-20 | Disposition: A | Discharge status: Home / Self Care | Payer: Medicaid Other | Source: Ambulatory Visit | Attending: Radiation Oncology | Admitting: Radiation Oncology

## 2018-05-20 ENCOUNTER — Telehealth: Payer: Self-pay | Admitting: Physical Therapy

## 2018-05-20 DIAGNOSIS — Z170 Estrogen receptor positive status [ER+]: Principal | ICD-10-CM

## 2018-05-20 DIAGNOSIS — C50412 Malignant neoplasm of upper-outer quadrant of left female breast: Secondary | ICD-10-CM

## 2018-05-20 DIAGNOSIS — Z483 Aftercare following surgery for neoplasm: Secondary | ICD-10-CM | POA: Insufficient documentation

## 2018-05-20 DIAGNOSIS — D0512 Intraductal carcinoma in situ of left breast: Secondary | ICD-10-CM | POA: Insufficient documentation

## 2018-05-20 DIAGNOSIS — Z510 Encounter for antineoplastic radiation therapy: Secondary | ICD-10-CM | POA: Diagnosis not present

## 2018-05-20 DIAGNOSIS — M25612 Stiffness of left shoulder, not elsewhere classified: Secondary | ICD-10-CM | POA: Insufficient documentation

## 2018-05-20 MED ORDER — RADIAPLEXRX EX GEL
Freq: Once | CUTANEOUS | Status: AC
  Administered 2018-05-20: 10:00:00 via TOPICAL

## 2018-05-23 ENCOUNTER — Ambulatory Visit
Admission: RE | Admit: 2018-05-23 | Discharge: 2018-05-23 | Disposition: A | Discharge status: Home / Self Care | Payer: Medicaid Other | Source: Ambulatory Visit | Attending: Radiation Oncology | Admitting: Radiation Oncology

## 2018-05-23 DIAGNOSIS — Z510 Encounter for antineoplastic radiation therapy: Secondary | ICD-10-CM | POA: Diagnosis not present

## 2018-05-24 ENCOUNTER — Ambulatory Visit
Admission: RE | Admit: 2018-05-24 | Discharge: 2018-05-24 | Disposition: A | Discharge status: Home / Self Care | Payer: Medicaid Other | Source: Ambulatory Visit | Attending: Radiation Oncology | Admitting: Radiation Oncology

## 2018-05-24 ENCOUNTER — Ambulatory Visit: Payer: Medicaid Other

## 2018-05-24 DIAGNOSIS — Z510 Encounter for antineoplastic radiation therapy: Secondary | ICD-10-CM | POA: Diagnosis not present

## 2018-05-25 ENCOUNTER — Ambulatory Visit: Payer: Medicaid Other

## 2018-05-25 ENCOUNTER — Ambulatory Visit
Admission: RE | Admit: 2018-05-25 | Discharge: 2018-05-25 | Disposition: A | Discharge status: Home / Self Care | Payer: Medicaid Other | Source: Ambulatory Visit | Attending: Radiation Oncology | Admitting: Radiation Oncology

## 2018-05-25 DIAGNOSIS — Z510 Encounter for antineoplastic radiation therapy: Secondary | ICD-10-CM | POA: Diagnosis not present

## 2018-05-25 DIAGNOSIS — M25612 Stiffness of left shoulder, not elsewhere classified: Secondary | ICD-10-CM | POA: Diagnosis present

## 2018-05-25 DIAGNOSIS — Z483 Aftercare following surgery for neoplasm: Secondary | ICD-10-CM | POA: Diagnosis present

## 2018-05-25 DIAGNOSIS — D0512 Intraductal carcinoma in situ of left breast: Secondary | ICD-10-CM

## 2018-05-25 NOTE — Therapy (Addendum)
[place], [place], [address] Phone: [phone]   Fax:  [phone]  Physical Therapy Treatment  Patient Details  Name: Casey Baker MRN: [id number] Date of Birth: 07/25/1971 Referring Provider: Dr. Gery Pray   Encounter Date: 05/25/2018  PT End of Session - 05/25/18 1102    Visit Number  3    Number of Visits  4    Date for PT Re-Evaluation  06/15/18    PT Start Time  1021    PT Stop Time  1101    PT Time Calculation (min)  40 min    Activity Tolerance  Patient tolerated treatment well    Behavior During Therapy  [facility] for tasks assessed/performed       Past Medical History:  Diagnosis Date  . Anemia   . Anxiety   . 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 Family history of breast cancer   . Family history of ovarian cancer   . Genetic testing 02/21/2018   STAT Breast panel with reflex to Breast/GYN panel (23 genes) @ [facility] - No pathogenic mutations detected  . Hypertension   . PONV (postoperative nausea and vomiting)   . Uterine fibroid     Past Surgical History:  Procedure Laterality Date  . AXILLARY SURGERY    . BREAST RECONSTRUCTION WITH PLACEMENT OF TISSUE EXPANDER AND FLEX HD (ACELLULAR HYDRATED DERMIS) Left 03/18/2018   Procedure: LEFT BREAST RECONSTRUCTION WITH PLACEMENT OF TISSUE EXPANDER AND FLEX HD (ACELLULAR HYDRATED DERMIS);  Surgeon: [name], MD;  Location: [place];  Service: Plastics;  Laterality: Left;  . KNEE ARTHROSCOPY Left 2013  . MASTECTOMY W/ SENTINEL NODE BIOPSY Left 03/18/2018   Procedure: TOTAL LEFT MASTECTOMY WITH AXILLARY SENTINEL LYMPH NODE BIOPSY;  Surgeon: Excell Seltzer, MD;  Location: [place];  Service: General;  Laterality: Left;  . TUBAL LIGATION      There were no vitals filed for this visit.  Subjective Assessment - 05/25/18 1022    Subjective  I'm doing really good. Been doing  the stretches when I'm doing things around the house and that has been helping.     Pertinent History  Lt Mastectomy with expander placement 03/18/18 due to DCIS, ALND 2, 1/2 positive, not needing chemotherapy     Patient Stated Goals  improve Lt shoulder ROM, hard to scratch the back,     Currently in Pain?  No/denies         [facility] PT Assessment - 05/25/18 0001      AROM   Left Shoulder Flexion  158 Degrees    Left Shoulder ABduction  171 Degrees                   [facility] Adult PT Treatment/Exercise - 05/25/18 0001      Shoulder Exercises: Supine   Horizontal ABduction  Strengthening;Both;10 reps;Theraband    Theraband Level (Shoulder Horizontal ABduction)  Level 1 (Yellow)    External Rotation  Strengthening;Both;10 reps;Theraband    Theraband Level (Shoulder External Rotation)  Level 1 (Yellow)    Flexion  Strengthening;Both;Theraband;5 reps able to do 8 reps; narrow and wide grip    Theraband Level (Shoulder Flexion)  Level 1 (Yellow)    Diagonals  Strengthening;Right;Left;Theraband;5 reps    Theraband Level (Shoulder Diagonals)  Level 1 (Yellow)      Shoulder Exercises: Pulleys   Flexion  2 minutes    Flexion Limitations  Pt returned  therapist demonstration     ABduction  2 minutes    ABduction Limitations  Pt returned therapist demonstration and required VCs to decrease Lt scapular compensation      Shoulder Exercises: Therapy Ball   Flexion  Both;10 reps    Flexion Limitations  Forward lean into end of stretch    ABduction  Left;5 reps    ABduction Limitations  Had to stop due to increased discomfort      Manual Therapy   Manual Therapy  Passive ROM    Passive ROM  In supine to Lt shoulder into flexion, abduction and D2 to pts tolerance               PT Short Term Goals - 05/25/18 1025      PT SHORT TERM GOAL #1   Title  Pt will be able to tolerate radiation position x 15-12mnutes subjectively     Status  Achieved      PT SHORT TERM GOAL #2    Title  Pt will improve Lt shoulder AROM to flex: 170, abduction 170    Baseline  Lt shoulder flex 158 and abduction 171 degrees-05/25/18    Status  Partially Met      PT SHORT TERM GOAL #3   Title  pt will be knowledgeable about lymphedema precautions including where to get a compresison sleeve/gauntlet    Baseline  Instructed in lymphedema precautions at last visit and order issued today with phone number of [facility]-05/25/18    Status  Achieved               Plan - 05/25/18 1205    Clinical Impression Statement  Pt came in today reporting she was ready for D/C. She says her Lt arm is doing great and with having radiation every day it will be too much on her to have other appointments. She has though met all goals except for ROM goal partially met due to flexion still being some limited though this has improved greatly since evaluation. Pt knows she can get another order if she needs to return at anytime. Progressed her HEP to include supine scapular series so pt can continue working on improving her scapular strength to further aid in her recovery. Also encouraged her to continue working stretches into her day with ADLs and to use doorway as well for end ROM stretching, especially thorughout the duration of radiation as her tissue could get tighter. She verbalized understanding all.    Rehab Potential  Excellent    Clinical Impairments Affecting Rehab Potential  visit limitations    PT Frequency  -- 1-2x per week    PT Duration  2 weeks    PT Treatment/Interventions  ADLs/Self Care Home Management;Patient/family education;Manual techniques;Passive range of motion;Therapeutic exercise    PT Next Visit Plan  D/C this visit.     Consulted and Agree with Plan of Care  Patient       Patient will benefit from skilled therapeutic intervention in order to improve the following deficits and impairments:  Decreased knowledge of use of DME, Decreased range of motion, Decreased activity  tolerance, Decreased knowledge of precautions  Visit Diagnosis: Ductal carcinoma in situ (DCIS) of left breast  Stiffness of left shoulder, not elsewhere classified  Aftercare following surgery for neoplasm     Problem List Patient Active Problem List   Diagnosis Date Noted  . Breast cancer, left breast ([place] 03/18/2018  . Genetic testing 02/21/2018  . Family history  of breast cancer   . Family history of ovarian cancer   . Iron deficiency anemia due to chronic blood loss 02/09/2018  . Malignant neoplasm of upper-outer quadrant of left female breast ([place]) 02/03/2018  . Breast wound, left, subsequent encounter 01/12/2018  . Hypertension   . Anxiety with depression   . Uterine fibroid 09/29/2014  . Menorrhagia 09/29/2014  . Anemia 09/29/2014  . Sinus tachycardia     Otelia Limes, PTA 05/25/2018, 12:13 PM  [place], [place], [address] Phone: [phone]   Fax:  [phone]  Name: Casey Baker MRN: [id number] Date of Birth: May 10, 1971   PHYSICAL THERAPY DISCHARGE SUMMARY  Visits from Start of Care: 3  Current functional level related to goals / functional outcomes: See above   Remaining deficits: Shoulder ROM    Education / Equipment: HEP for shoulder strength and mobility  Plan: Patient agrees to discharge.  Patient goals were partially met. Patient is being discharged due to being pleased with the current functional level.  ?????    Shan Levans, PT

## 2018-05-25 NOTE — Patient Instructions (Signed)

## 2018-05-26 ENCOUNTER — Ambulatory Visit
Admission: RE | Admit: 2018-05-26 | Discharge: 2018-05-26 | Disposition: A | Discharge status: Home / Self Care | Payer: Medicaid Other | Source: Ambulatory Visit | Attending: Radiation Oncology | Admitting: Radiation Oncology

## 2018-05-26 DIAGNOSIS — Z510 Encounter for antineoplastic radiation therapy: Secondary | ICD-10-CM | POA: Diagnosis not present

## 2018-05-27 ENCOUNTER — Ambulatory Visit
Admission: RE | Admit: 2018-05-27 | Discharge: 2018-05-27 | Disposition: A | Discharge status: Home / Self Care | Payer: Medicaid Other | Source: Ambulatory Visit | Attending: Radiation Oncology | Admitting: Radiation Oncology

## 2018-05-27 ENCOUNTER — Ambulatory Visit: Payer: Medicaid Other | Admitting: Rehabilitation

## 2018-05-27 DIAGNOSIS — Z510 Encounter for antineoplastic radiation therapy: Secondary | ICD-10-CM | POA: Diagnosis not present

## 2018-05-30 ENCOUNTER — Ambulatory Visit
Admission: RE | Admit: 2018-05-30 | Discharge: 2018-05-30 | Disposition: A | Discharge status: Home / Self Care | Payer: Medicaid Other | Source: Ambulatory Visit | Attending: Radiation Oncology | Admitting: Radiation Oncology

## 2018-05-30 ENCOUNTER — Ambulatory Visit: Payer: Medicaid Other | Admitting: Internal Medicine

## 2018-05-30 DIAGNOSIS — Z510 Encounter for antineoplastic radiation therapy: Secondary | ICD-10-CM | POA: Diagnosis not present

## 2018-05-31 ENCOUNTER — Ambulatory Visit
Admission: RE | Admit: 2018-05-31 | Discharge: 2018-05-31 | Disposition: A | Discharge status: Home / Self Care | Payer: Medicaid Other | Source: Ambulatory Visit | Attending: Radiation Oncology | Admitting: Radiation Oncology

## 2018-05-31 DIAGNOSIS — Z510 Encounter for antineoplastic radiation therapy: Secondary | ICD-10-CM | POA: Diagnosis not present

## 2018-06-01 ENCOUNTER — Ambulatory Visit
Admission: RE | Admit: 2018-06-01 | Discharge: 2018-06-01 | Disposition: A | Discharge status: Home / Self Care | Payer: Medicaid Other | Source: Ambulatory Visit | Attending: Radiation Oncology | Admitting: Radiation Oncology

## 2018-06-01 DIAGNOSIS — Z510 Encounter for antineoplastic radiation therapy: Secondary | ICD-10-CM | POA: Diagnosis not present

## 2018-06-02 ENCOUNTER — Telehealth: Payer: Self-pay | Admitting: Hematology

## 2018-06-02 ENCOUNTER — Ambulatory Visit
Admission: RE | Admit: 2018-06-02 | Discharge: 2018-06-02 | Disposition: A | Discharge status: Home / Self Care | Payer: Medicaid Other | Source: Ambulatory Visit | Attending: Radiation Oncology | Admitting: Radiation Oncology

## 2018-06-02 ENCOUNTER — Inpatient Hospital Stay: Payer: Medicaid Other | Admitting: Hematology

## 2018-06-02 ENCOUNTER — Inpatient Hospital Stay: Payer: Medicaid Other

## 2018-06-02 DIAGNOSIS — Z510 Encounter for antineoplastic radiation therapy: Secondary | ICD-10-CM | POA: Diagnosis not present

## 2018-06-02 NOTE — Telephone Encounter (Signed)
Spoke with patient regarding r/s her appt from 8/15.  New date / Time ok with patient per 8/14 sch msg

## 2018-06-03 ENCOUNTER — Ambulatory Visit
Admission: RE | Admit: 2018-06-03 | Discharge: 2018-06-03 | Disposition: A | Discharge status: Home / Self Care | Payer: Medicaid Other | Source: Ambulatory Visit | Attending: Radiation Oncology | Admitting: Radiation Oncology

## 2018-06-03 DIAGNOSIS — Z510 Encounter for antineoplastic radiation therapy: Secondary | ICD-10-CM | POA: Diagnosis not present

## 2018-06-05 ENCOUNTER — Ambulatory Visit: Admission: RE | Admit: 2018-06-05 | Payer: Medicaid Other | Source: Ambulatory Visit

## 2018-06-06 ENCOUNTER — Ambulatory Visit
Admission: RE | Admit: 2018-06-06 | Discharge: 2018-06-06 | Disposition: A | Discharge status: Home / Self Care | Payer: Medicaid Other | Source: Ambulatory Visit | Attending: Radiation Oncology | Admitting: Radiation Oncology

## 2018-06-06 DIAGNOSIS — Z510 Encounter for antineoplastic radiation therapy: Secondary | ICD-10-CM | POA: Diagnosis not present

## 2018-06-07 ENCOUNTER — Ambulatory Visit
Admission: RE | Admit: 2018-06-07 | Discharge: 2018-06-07 | Disposition: A | Discharge status: Home / Self Care | Payer: Medicaid Other | Source: Ambulatory Visit | Attending: Radiation Oncology | Admitting: Radiation Oncology

## 2018-06-07 DIAGNOSIS — Z170 Estrogen receptor positive status [ER+]: Principal | ICD-10-CM

## 2018-06-07 DIAGNOSIS — Z510 Encounter for antineoplastic radiation therapy: Secondary | ICD-10-CM | POA: Diagnosis not present

## 2018-06-07 DIAGNOSIS — C50412 Malignant neoplasm of upper-outer quadrant of left female breast: Secondary | ICD-10-CM

## 2018-06-07 MED ORDER — ALRA NON-METALLIC DEODORANT (RAD-ONC)
1.0000 "application " | Freq: Once | TOPICAL | Status: AC
  Administered 2018-06-07: 1 via TOPICAL

## 2018-06-07 MED ORDER — RADIAPLEXRX EX GEL
Freq: Once | CUTANEOUS | Status: AC
  Administered 2018-06-07: 15:00:00 via TOPICAL

## 2018-06-08 ENCOUNTER — Ambulatory Visit
Admission: RE | Admit: 2018-06-08 | Discharge: 2018-06-08 | Disposition: A | Discharge status: Home / Self Care | Payer: Medicaid Other | Source: Ambulatory Visit | Attending: Radiation Oncology | Admitting: Radiation Oncology

## 2018-06-08 DIAGNOSIS — Z510 Encounter for antineoplastic radiation therapy: Secondary | ICD-10-CM | POA: Diagnosis not present

## 2018-06-09 ENCOUNTER — Ambulatory Visit
Admission: RE | Admit: 2018-06-09 | Discharge: 2018-06-09 | Disposition: A | Discharge status: Home / Self Care | Payer: Medicaid Other | Source: Ambulatory Visit | Attending: Radiation Oncology | Admitting: Radiation Oncology

## 2018-06-09 DIAGNOSIS — Z510 Encounter for antineoplastic radiation therapy: Secondary | ICD-10-CM | POA: Diagnosis not present

## 2018-06-10 ENCOUNTER — Ambulatory Visit
Admission: RE | Admit: 2018-06-10 | Discharge: 2018-06-10 | Disposition: A | Discharge status: Home / Self Care | Payer: Medicaid Other | Source: Ambulatory Visit | Attending: Radiation Oncology | Admitting: Radiation Oncology

## 2018-06-10 DIAGNOSIS — Z510 Encounter for antineoplastic radiation therapy: Secondary | ICD-10-CM | POA: Diagnosis not present

## 2018-06-13 ENCOUNTER — Ambulatory Visit
Admission: RE | Admit: 2018-06-13 | Discharge: 2018-06-13 | Disposition: A | Discharge status: Home / Self Care | Payer: Medicaid Other | Source: Ambulatory Visit | Attending: Radiation Oncology | Admitting: Radiation Oncology

## 2018-06-13 DIAGNOSIS — Z510 Encounter for antineoplastic radiation therapy: Secondary | ICD-10-CM | POA: Diagnosis not present

## 2018-06-14 ENCOUNTER — Ambulatory Visit: Payer: Medicaid Other | Admitting: Radiation Oncology

## 2018-06-14 ENCOUNTER — Other Ambulatory Visit: Payer: Self-pay | Admitting: Radiation Oncology

## 2018-06-14 ENCOUNTER — Ambulatory Visit
Admission: RE | Admit: 2018-06-14 | Discharge: 2018-06-14 | Disposition: A | Discharge status: Home / Self Care | Payer: Medicaid Other | Source: Ambulatory Visit | Attending: Radiation Oncology | Admitting: Radiation Oncology

## 2018-06-14 DIAGNOSIS — N951 Menopausal and female climacteric states: Secondary | ICD-10-CM

## 2018-06-14 DIAGNOSIS — Z510 Encounter for antineoplastic radiation therapy: Secondary | ICD-10-CM | POA: Diagnosis not present

## 2018-06-14 DIAGNOSIS — C50412 Malignant neoplasm of upper-outer quadrant of left female breast: Secondary | ICD-10-CM

## 2018-06-14 DIAGNOSIS — Z170 Estrogen receptor positive status [ER+]: Principal | ICD-10-CM

## 2018-06-14 MED ORDER — CYCLOBENZAPRINE HCL 10 MG PO TABS: 10.0000 mg | ORAL_TABLET | Freq: Three times a day (TID) | ORAL | 0 refills | Status: DC | PRN

## 2018-06-14 MED ORDER — SONAFINE EX EMUL
1.0000 "application " | Freq: Two times a day (BID) | CUTANEOUS | Status: DC
  Administered 2018-06-14: 1 via TOPICAL

## 2018-06-15 ENCOUNTER — Ambulatory Visit
Admission: RE | Admit: 2018-06-15 | Discharge: 2018-06-15 | Disposition: A | Discharge status: Home / Self Care | Payer: Medicaid Other | Source: Ambulatory Visit | Attending: Radiation Oncology | Admitting: Radiation Oncology

## 2018-06-15 DIAGNOSIS — Z510 Encounter for antineoplastic radiation therapy: Secondary | ICD-10-CM | POA: Diagnosis not present

## 2018-06-16 ENCOUNTER — Ambulatory Visit
Admission: RE | Admit: 2018-06-16 | Discharge: 2018-06-16 | Disposition: A | Discharge status: Home / Self Care | Payer: Medicaid Other | Source: Ambulatory Visit | Attending: Radiation Oncology | Admitting: Radiation Oncology

## 2018-06-16 DIAGNOSIS — Z510 Encounter for antineoplastic radiation therapy: Secondary | ICD-10-CM | POA: Diagnosis not present

## 2018-06-17 ENCOUNTER — Ambulatory Visit: Payer: Medicaid Other

## 2018-06-17 ENCOUNTER — Ambulatory Visit
Admission: RE | Admit: 2018-06-17 | Discharge: 2018-06-17 | Disposition: A | Discharge status: Home / Self Care | Payer: Medicaid Other | Source: Ambulatory Visit | Attending: Radiation Oncology | Admitting: Radiation Oncology

## 2018-06-17 ENCOUNTER — Telehealth: Payer: Self-pay

## 2018-06-17 DIAGNOSIS — Z510 Encounter for antineoplastic radiation therapy: Secondary | ICD-10-CM | POA: Diagnosis not present

## 2018-06-17 NOTE — Telephone Encounter (Signed)
Pt was requesting pain medications. Contacted pt to convey that Dr. Sondra Come was not in the office and that covering physician would encourage pt to maximize OTC Ibuprofen 800mg  Q6hr alternating with Tylenol Extra Strength Q6hr. Pt agreeable to try that medication regimen. Pt verbalized understanding of instructions. [name], RN BSN

## 2018-06-21 ENCOUNTER — Other Ambulatory Visit: Payer: Self-pay | Admitting: Radiation Oncology

## 2018-06-21 ENCOUNTER — Ambulatory Visit: Payer: Medicaid Other | Admitting: Radiation Oncology

## 2018-06-21 ENCOUNTER — Ambulatory Visit
Admission: RE | Admit: 2018-06-21 | Discharge: 2018-06-21 | Disposition: A | Discharge status: Home / Self Care | Payer: Medicaid Other | Source: Ambulatory Visit | Attending: Radiation Oncology | Admitting: Radiation Oncology

## 2018-06-21 DIAGNOSIS — Z170 Estrogen receptor positive status [ER+]: Secondary | ICD-10-CM | POA: Insufficient documentation

## 2018-06-21 DIAGNOSIS — C50412 Malignant neoplasm of upper-outer quadrant of left female breast: Secondary | ICD-10-CM | POA: Diagnosis not present

## 2018-06-21 DIAGNOSIS — Z510 Encounter for antineoplastic radiation therapy: Secondary | ICD-10-CM | POA: Insufficient documentation

## 2018-06-21 MED ORDER — OXYCODONE HCL 5 MG PO TABS: 5.0000 mg | ORAL_TABLET | ORAL | 0 refills | Status: DC | PRN

## 2018-06-22 ENCOUNTER — Ambulatory Visit
Admission: RE | Admit: 2018-06-22 | Discharge: 2018-06-22 | Disposition: A | Discharge status: Home / Self Care | Payer: Medicaid Other | Source: Ambulatory Visit | Attending: Radiation Oncology | Admitting: Radiation Oncology

## 2018-06-22 DIAGNOSIS — Z510 Encounter for antineoplastic radiation therapy: Secondary | ICD-10-CM | POA: Diagnosis not present

## 2018-06-23 ENCOUNTER — Ambulatory Visit
Admission: RE | Admit: 2018-06-23 | Discharge: 2018-06-23 | Disposition: A | Discharge status: Home / Self Care | Payer: Medicaid Other | Source: Ambulatory Visit | Attending: Radiation Oncology | Admitting: Radiation Oncology

## 2018-06-23 DIAGNOSIS — Z510 Encounter for antineoplastic radiation therapy: Secondary | ICD-10-CM | POA: Diagnosis not present

## 2018-06-24 ENCOUNTER — Ambulatory Visit: Payer: Medicaid Other

## 2018-06-24 ENCOUNTER — Ambulatory Visit
Admission: RE | Admit: 2018-06-24 | Discharge: 2018-06-24 | Disposition: A | Discharge status: Home / Self Care | Payer: Medicaid Other | Source: Ambulatory Visit | Attending: Radiation Oncology | Admitting: Radiation Oncology

## 2018-06-24 DIAGNOSIS — Z510 Encounter for antineoplastic radiation therapy: Secondary | ICD-10-CM | POA: Diagnosis not present

## 2018-06-27 ENCOUNTER — Ambulatory Visit: Payer: Medicaid Other

## 2018-06-27 ENCOUNTER — Ambulatory Visit
Admission: RE | Admit: 2018-06-27 | Discharge: 2018-06-27 | Disposition: A | Discharge status: Home / Self Care | Payer: Medicaid Other | Source: Ambulatory Visit | Attending: Radiation Oncology | Admitting: Radiation Oncology

## 2018-06-27 ENCOUNTER — Encounter: Payer: Self-pay | Admitting: Radiation Oncology

## 2018-06-27 ENCOUNTER — Other Ambulatory Visit (HOSPITAL_COMMUNITY): Payer: Self-pay | Admitting: *Deleted

## 2018-06-27 ENCOUNTER — Other Ambulatory Visit: Payer: Self-pay | Admitting: Hematology

## 2018-06-27 DIAGNOSIS — Z510 Encounter for antineoplastic radiation therapy: Secondary | ICD-10-CM | POA: Diagnosis not present

## 2018-06-27 DIAGNOSIS — Z170 Estrogen receptor positive status [ER+]: Principal | ICD-10-CM

## 2018-06-27 DIAGNOSIS — Z0000 Encounter for general adult medical examination without abnormal findings: Secondary | ICD-10-CM

## 2018-06-27 DIAGNOSIS — C50412 Malignant neoplasm of upper-outer quadrant of left female breast: Secondary | ICD-10-CM

## 2018-06-27 NOTE — Progress Notes (Signed)
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 Findings are suspicious for Malignancy. ADDENDUM: Magnification views of the retroareolar right breast were performed to evaluate calcifications seen on the patient's baseline mammogram. There are loosely grouped and scattered calcifications in the outer right breast, superior to and directly posterior to the nipple. These calcifications are rounded and smudgy in the CC projection, and many of them demonstrate layering on the 90 degree lateral magnification view, consistent with benign milk of calcium. No suspicious microcalcifications are seen in the right breast.    01/31/2018 Initial Biopsy    Diagnosis 01/31/18 1. 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 - NEGATIVE FOR INVASIVE CARCINOMA. 2. 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 - NEGATIVE FOR INVASIVE CARCINOMA.    01/31/2018 Receptors her2    Prognostic indicators significant for: ER, 100%  positive and PR, 100% positive, both with strong staining intensity.     02/03/2018 Initial Diagnosis    Ductal carcinoma in situ (DCIS) of left breast    02/10/2018 Genetic Testing    Testing did not reveal a pathogenic mutation in any of the genes analyzed.A copy of the genetic test report will be scanned into Epic under the Media tab. The genes analyzed were the 23 genes on [facility]'s Breast/GYN panel (ATM, BARD1, BRCA1, BRCA2, BRIP1, CDH1, CHEK2, DICER1, EPCAM, MLH1,  MSH2, MSH6, NBN, NF1, PALB2, PMS2, PTEN, RAD50, RAD51C, RAD51D,SMARCA4, STK11, and TP53).  Genetic testing involved analysis of 9 genes: ATM, BRCA1, BRCA2, CDH1, CHEK2, PALB2, PTEN, STK11 and TP53 genes. Testing was normal and did not reveal a mutation in these genes.      03/18/2018 Surgery    TOTAL LEFT MASTECTOMY WITH AXILLARY SENTINEL LYMPH NODE BIOPSY and  LEFT BREAST RECONSTRUCTION WITH PLACEMENT OF TISSUE EXPANDER AND FLEX HD (ACELLULAR HYDRATED DERMIS) by Dr. HExcell Seltzerand Dr. TIran Baker 03/18/18    03/18/2018 Pathology Results    Diagnosis 03/18/18 1. Lymph node, sentinel, biopsy, Left Axillary #1 - METASTATIC CARCINOMA IN ONE OF ONE LYMPH NODES (1/1). 2. Lymph node, sentinel, biopsy, Left Axillary #2 - ONE OF ONE LYMPH NODES NEGATIVE FOR CARCINOMA (0/1). 3. Breast, simple mastectomy, Left Total - MICROINVASIVE DUCTAL CARCINOMA ARISING IN A BACKGROUND OF LOW GRADE DUCTAL CARCINOMA IN SITU. - DUCTAL CARCINOMA IN SITU PARTIALLY INVOLVES AN INTRADUCTAL PAPILLOMA. - RESECTION MARGINS ARE NEGATIVE FOR INVASIVE CARCINOMA. - IN SITU CARCINOMA IS PRESENT AT THE ANTERIOR MARGIN BROADLY. - BIOPSY SITE. - SEE ONCOLOGY TABLE.     03/18/2018 Receptors her2  Estrogen Receptor: 100%, POSITIVE, MODERATE-WEAK STAINING INTENSITY Progesterone Receptor: 100%, POSITIVE, STRONG STAINING INTENSITY Proliferation Marker Ki67: 1% HER2 - NEGATIVE    03/18/2018 Cancer Staging    Staging form: Breast, AJCC 8th Edition - Pathologic  stage from 03/18/2018: Stage IA (pT88m, pN1a, cM0, G1, ER+, PR+, HER2-) - Signed by Casey Baker on 03/28/2018    06/27/2018 Mammogram    06/27/2018 Mammogram IMPRESSION: Benign appearing right breast calcifications.  Recommendation: Screening right mammogram is suggested in April 2020.     HISTORY OF PRESENTING ILLNESS:  [name]47y.o. 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 The patient was referred by her PCP Dr. CElly Baker The patient presents to the clinic today accompanied by her family member.  Prior to pt's abnormal mammogram, she felt the mass herself and noticed some bloody nipple discharge in January 2019. She states that was her first mammogram. Since the biopsy, she reports the nipple discharge has stopped.   Pt's diagnostic mammogram from 01/27/18 revealed calcifications in the left breast and suspicious bloody discharge from the nippy with palpable masses in 2 o'clock position. Her initial biopsy confirmed low grade DCIS partially involving an intraductal papilloma.  Prognostic indicators significant for: ER, 100% positive and PR, 100% positive, both with strong staining intensity.  She reports her last period was in Dec 2018 and she has starting experiencing hot flashes. She noticed to prior to her last period that it was getting lighter and more irregular.  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 She states she has HTN but has never been started on medication. She reports she has mood swings and she was prescribed Paxil but she has not tried it yet. She has a surgical history of breast tissue excision in her early 20's.   GYN HISTORY  Menarchal: 11-12 LMP: Dec 2018 Contraceptive: none  HRT: n/a  G6P3: first pregnancy at age 47 She has a FMHx of Ovarian CA by her grandmother and Breast CA by her cousin. She denies tobacco use and alcohol use.   [place]    CURRENT THERAPY: pending tamoxifen    INTERVAL HISTORY  Casey AKKERMANis here for a follow up. She has been doing well overall. She recently completed radiation therapy on yesterday, 06/27/2018. Her LMP was in December 2018 and she had an occurrence with spotting, only noted with wiping. She has been seen by a GYN specialist and she notes that she would like a hysterectomy due to fibroids.   Since her last visit to the office, she had an endometrial biopsy completed on 04/20/2018 that showed: Endometrium, biopsy with scant benign columnar mucosa.   She also had an [place]oreaPelvis on 04/29/2018 that showed: Enlarged nodular uterus containing numerous leiomyomata as above. Nonvisualization of endometrial complex and ovaries.  On review of systems, she reports hyperpigmentation changes to her left breast, resolved pain with raising left arm, intermittent hot flashes. She would like to go to physical therapy due to the pulling sensation to her left axilla. She has seen PT in the past. she denies fatigue, pain, and any other symptoms. Pertinent positives are listed and detailed within the above HPI.    MEDICAL HISTORY:  Past Medical History:  Diagnosis Date  . Anemia   . Anxiety   . 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  Family history of breast cancer   . Family history of ovarian cancer   . Genetic testing 02/21/2018   STAT Breast panel with reflex to Breast/GYN panel (23 genes) @ [facility] - No pathogenic mutations detected  . Hypertension   . PONV (postoperative nausea and vomiting)   . Uterine fibroid     SURGICAL HISTORY: Past Surgical History:  Procedure Laterality Date  . AXILLARY SURGERY    . BREAST RECONSTRUCTION WITH PLACEMENT OF TISSUE EXPANDER AND FLEX HD (ACELLULAR HYDRATED DERMIS) Left 03/18/2018   Procedure: LEFT BREAST RECONSTRUCTION WITH PLACEMENT OF TISSUE EXPANDER AND FLEX HD (ACELLULAR HYDRATED DERMIS);  Surgeon: [name], Baker;   Location: [facility];  Service: Plastics;  Laterality: Left;  . KNEE ARTHROSCOPY Left 2013  . MASTECTOMY W/ SENTINEL NODE BIOPSY Left 03/18/2018   Procedure: TOTAL LEFT MASTECTOMY WITH AXILLARY SENTINEL LYMPH NODE BIOPSY;  Surgeon: Excell Seltzer, Baker;  Location: [facility];  Service: General;  Laterality: Left;  . TUBAL LIGATION      SOCIAL HISTORY: Social History   Socioeconomic History  . Marital status: Single    Spouse name: Not on file  . Number of children: Not on file  . Years of education: Not on file  . Highest education level: Not on file  Occupational History  . Not on file  Social Needs  . Financial resource strain: Not on file  . Food insecurity:    Worry: Not on file    Inability: Not on file  . Transportation needs:    Medical: Not on file    Non-medical: Not on file  Tobacco Use  . Smoking status: Never Smoker  . Smokeless tobacco: Never Used  Substance and Sexual Activity  . Alcohol use: No  . Drug use: No  . Sexual activity: Yes  Lifestyle  . Physical activity:    Days per week: 2 days    Minutes per session: 50 min  . Stress: Very much  Relationships  . Social connections:    Talks on phone: More than three times a week    Gets together: More than three times a week    Attends religious service: More than 4 times per year    Active member of club or organization: No    Attends meetings of clubs or organizations: Never    Relationship status: Never married  . Intimate partner violence:    Fear of current or ex partner: No    Emotionally abused: No    Physically abused: No    Forced sexual activity: No  Other Topics Concern  . Not on file  Social History Narrative  . Not on file    FAMILY HISTORY: Family History  Problem Relation Age of Onset  . Hypertension Mother   . Ovarian cancer Maternal Grandmother        dx 78s; deceased 82s. She declined therapy of any kind including surgery and chemo  . Breast  cancer Cousin        dx 82s; currently 80s; daughter of matenral uncle  . Leukemia Maternal Aunt        currently 4    ALLERGIES:  has No Known Allergies.  MEDICATIONS:  Current Outpatient Medications  Medication Sig Dispense Refill  . Aspirin-Acetaminophen-Caffeine (GOODY HEADACHE PO) Take 1 packet by mouth daily as needed (headache).     . cyclobenzaprine (FLEXERIL) 10 MG tablet Take 1 tablet (10 mg total) by mouth 3 (three) times daily as needed for  muscle spasms. 30 tablet 0  . lisinopril (PRINIVIL,ZESTRIL) 10 MG tablet Take 1 tablet (10 mg total) by mouth daily. 90 tablet 1  . oxyCODONE (ROXICODONE) 5 MG immediate release tablet Take 1 tablet (5 mg total) by mouth every 4 (four) hours as needed for severe pain. 40 tablet 0  . ferrous sulfate 325 (65 FE) MG tablet Take 1 tablet (325 mg total) by mouth daily with breakfast. (Patient not taking: Reported on 06/17/2018) 30 tablet 3  . tamoxifen (NOLVADEX) 20 MG tablet Take 1 tablet (20 mg total) by mouth daily. 30 tablet 2  . venlafaxine XR (EFFEXOR-XR) 75 MG 24 hr capsule Take 1 capsule (75 mg total) by mouth daily with breakfast. (Patient not taking: Reported on 06/17/2018) 30 capsule 2   No current facility-administered medications for this visit.     REVIEW OF SYSTEMS:   Constitutional: Denies fevers, chills or abnormal night sweats  Eyes: Denies blurriness of vision, double vision or watery eyes Ears, nose, mouth, throat, and face: Denies mucositis or sore throat Respiratory: Denies cough, dyspnea or wheezes Cardiovascular: Denies palpitation, chest discomfort or lower extremity swelling Gastrointestinal:  Denies nausea, heartburn or change in bowel habits Skin: Denies abnormal skin rashes, (+) hyperpigmentation changes to left breast Lymphatics: Denies new lymphadenopathy or easy bruising Neurological:Denies numbness, tingling or new weaknesses Behavioral/Psych: Mood is stable, no new changes (+) anxiety All other systems were  reviewed with the patient and are negative.  PHYSICAL EXAMINATION:  ECOG PERFORMANCE STATUS: 0 - Asymptomatic  Vitals:   06/28/18 0908  BP: (!) 148/81  Pulse: (!) 133  Resp: 20  Temp: 98.4 F (36.9 C)  SpO2: 99%   Filed Weights   06/28/18 0908  Weight: 174 lb 3.2 oz (79 kg)    GENERAL:alert, no distress and comfortable SKIN: skin color, texture, turgor are normal, no rashes or significant lesions EYES: normal, conjunctiva are pink and non-injected, sclera clear OROPHARYNX:no exudate, no erythema and lips, buccal mucosa, and tongue normal  NECK: supple, thyroid normal size, non-tender, without nodularity LYMPH:  no palpable lymphadenopathy in the cervical, axillary or inguinal LUNGS: clear to auscultation and percussion with normal breathing effort HEART: regular rate & rhythm and no murmurs and no lower extremity edema ABDOMEN:abdomen soft, non-tender and normal bowel sounds Musculoskeletal:no cyanosis of digits and no clubbing  PSYCH: alert & oriented x 3 with fluent speech NEURO: no focal motor/sensory deficits Breasts: (+) S/p left breast mastectomy. Diffuse skin hyperpigmentation from radiation and few skin ulcers in axilla and breast. No discharge.   LABORATORY DATA:  I have reviewed the data as listed CBC Latest Ref Rng & Units 06/28/2018 02/10/2018 12/08/2017  WBC 3.9 - 10.3 K/uL 2.6(L) 3.8(L) 5.0  Hemoglobin 11.6 - 15.9 g/dL 11.8 12.1 8.9(L)  Hematocrit 34.8 - 46.6 % 36.7 39.2 31.8(L)  Platelets 145 - 400 K/uL 263 349 406(H)   CMP Latest Ref Rng & Units 06/28/2018 02/10/2018 12/08/2017  Glucose 70 - 99 mg/dL 106(H) 97 112(H)  BUN 6 - 20 mg/dL '8 9 6  ' Creatinine 0.44 - 1.00 mg/dL 0.88 0.84 0.87  Sodium 135 - 145 mmol/L 140 140 139  Potassium 3.5 - 5.1 mmol/L 4.1 3.9 4.3  Chloride 98 - 111 mmol/L 104 106 104  CO2 22 - 32 mmol/L '26 25 22  ' Calcium 8.9 - 10.3 mg/dL 9.5 9.9 9.1  Total Protein 6.5 - 8.1 g/dL 7.6 8.2 -  Total Bilirubin 0.3 - 1.2 mg/dL 0.6 0.6 -  Alkaline  Phos 38 -  126 U/L 77 71 -  AST 15 - 41 U/L 12(L) 13 -  ALT 0 - 44 U/L 8 8 -   PATHOLOGY  Diagnosis 03/18/18 1. Lymph node, sentinel, biopsy, Left Axillary #1 - METASTATIC CARCINOMA IN ONE OF ONE LYMPH NODES (1/1). 2. Lymph node, sentinel, biopsy, Left Axillary #2 - ONE OF ONE LYMPH NODES NEGATIVE FOR CARCINOMA (0/1). 3. Breast, simple mastectomy, Left Total - MICROINVASIVE DUCTAL CARCINOMA ARISING IN A BACKGROUND OF LOW GRADE DUCTAL CARCINOMA IN SITU. - DUCTAL CARCINOMA IN SITU PARTIALLY INVOLVES AN INTRADUCTAL PAPILLOMA. - RESECTION MARGINS ARE NEGATIVE FOR INVASIVE CARCINOMA. - IN SITU CARCINOMA IS PRESENT AT THE ANTERIOR MARGIN BROADLY. - BIOPSY SITE. - SEE ONCOLOGY TABLE. Microscopic Comment 3. INVASIVE CARCINOMA OF THE BREAST: Resection Procedure: Left simple mastectomy with left axillary sentinel lymph node biopsies. Specimen Laterality: Left. Tumor Size: <1 mm. Histologic Type: Invasive ductal carcinoma. Histologic Grade: Glandular (Acinar)/Tubular Differentiation: 1 Nuclear Pleomorphism: 1 Mitotic Rate: 1 Overall Grade: 1 Ductal Carcinoma In Situ: Extensive, low grade. Partially involves intraductal papilloma. Tumor Extension: Confined to breast parenchyma. Margins: Distance from closest margin (millimeters): >10 mm. Specify closest margin (required only if <90m): N/A. DCIS Margins: Distance from closest margin (millimeters): Present at anterior margin broadly. Specify closest margin (required only if <131m: Anterior. Cannot be determined (explain): N/A. Regional Lymph Nodes: Number of Lymph Nodes Examined: 2 Number of Sentinel Nodes Examined (if applicable): 2 Number of Lymph Nodes with Macrometastases (>2 mm): 1 2 of 4 FINAL for Mcnelly, Casey L (S([id number] Comment(continued) Number of Lymph Nodes with Micrometastases: 0 Number of Lymph Nodes with Isolated Tumor Cells (0.2 mm or 200 cells)#: 0 Size of Largest Metastatic Deposit (millimeters): 2  mm. Extranodal Extension: None. Treatment Effect: No known presurgical therapy Breast Biomarker Testing Performed on Previous Biopsy: Will be attempted on part #1. Testing Performed on Case Number: [id number] Receptor: Positive, 100% strong staining. Progesterone Receptor: Positive, 100% strong staining. HER2: N/A. Ki-67: N/A. Representative tumor block: 1A, 3C (limited). Pathologic Stage Classification (pTNM, AJCC 8th Edition): pT1m82mpN1a Comments: Immunohistochemistry is performed on several blocks. There is focal lose of basal cell markers (calponin, smooth muscle myosin) corresponding to microinvasion (block 3C). (v4.2.0.0) 1. PROGNOSTIC INDICATORS Results: IMMUNOHISTOCHEMICAL AND MORPHOMETRIC ANALYSIS PERFORMED MANUALLY Estrogen Receptor: 100%, POSITIVE, MODERATE-WEAK STAINING INTENSITY Progesterone Receptor: 100%, POSITIVE, STRONG STAINING INTENSITY Proliferation Marker Ki67: 1% REFERENCE RANGE ESTROGEN RECEPTOR NEGATIVE 0% POSITIVE =>1% REFERENCE RANGE PROGESTERONE RECEPTOR NEGATIVE 0% POSITIVE =>1% 1. FLUORESCENCE IN-SITU HYBRIDIZATION Results: HER2 - NEGATIVE RATIO OF HER2/CEP17 SIGNALS 1.13 AVERAGE HER2 COPY NUMBER PER CELL 1.70 Reference Range: NEGATIVE HER2/CEP17 Ratio <2.0 and average HER2 copy number <4.0 EQUIVOCAL HER2/CEP17 Ratio <2.0 and average HER2 copy number >=4.0 and <6.0    Diagnosis 01/31/18 1. 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 - NEGATIVE FOR INVASIVE CARCINOMA. 2. 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 - NEGATIVE FOR INVASIVE CARCINOMA. 1 of 2 FINAL for EATKWANZA, [id number] Note 1. Dr. KisLyndon Codes reviewed this case and concurs with the above diagnosis. [facility] notified on 02/01/18. A  breast prognostic profile is pending and will be reported in an addendum. (NK:kh 02/01/18) Results: Estrogen Receptor: 100%, POSITIVE, STRONG STAINING INTENSITY Progesterone Receptor: 100%, POSITIVE, STRONG STAINING INTENSITY  RADIOGRAPHIC STUDIES: I have personally reviewed the radiological  images as listed and agreed with the findings in the report.  06/27/2018 Mammogram IMPRESSION: Benign appearing right breast calcifications.  Recommendation: Screening right mammogram is suggested in April 2020.  Diagnostic Mammogram and [place] 01/27/18 IMPRESSION: Suspicious bloody left nipple discharge with palpable masses in the 2 o'clock axis of the left breast. Findings are suspicious for Malignancy. ADDENDUM: Magnification views of the retroareolar right breast were performed to evaluate calcifications seen on the patient's baseline mammogram. There are loosely grouped and scattered calcifications in the outer right breast, superior to and directly posterior to the nipple. These calcifications are rounded and smudgy in the CC projection, and many of them demonstrate layering on the 90 degree lateral magnification view, consistent with benign milk of calcium. No suspicious microcalcifications are seen in the right breast.  No results found.  ASSESSMENT & PLAN:  Casey Baker is 47 year old pre-menopausal woman, presented with screening discovered to DCIS.   1.  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 -She underwent left breast mastectomy and tissue expander placement on 03/18/18 -Her biopsy initially only showed DCIS in her left breast, her surgical pathology shows evidence of DCIS with microinvasion, low grade and 1 out of 2 positive lymph nodes.  She had negative surgical margins, her tumor was completely resected.  -She did not have enough surgical tissue sample for mammaprint or oncotype testing.  -Given her family history of breast cancer and young age, genetic testing was recommended to her. Referral was previously made and her results were negative for gene mutations.  -I discussed given her grade 1 tumor, low Ki67 (1%) microinvasive disease and 1 positive lymph node which was only 52m (close to micrometastasis), I suspect she has low risk disease even if we did Oncotype or mammaprint.  I think the benefit of adjuvant chemotherapy could be small and was not recommended.  -She now has completed adjuvant radiation, tolerated well overall. -Given her strongly ER and PR positivity of the tumor cells, and her pre-menopause status, I recommend adjuvant endocrine therapy with tamoxifen to prevent future breast cancer.  ---Giving her strongly ER and PR positivity of the tumor cells, and her pre-menopause status, I recommend adjuvant endocrine therapy with tamoxifen. The potential side effects, which includes but not limited to, hot flash, skin and vaginal dryness, slightly increased risk of cardiovascular disease and cataract, small risk of thrombosis and endometrial cancer, were discussed with her in great details. Preventive strategies for thrombosis, such as being physically active, using compression stocks, avoid cigarette smoking, etc., were reviewed with her. I also recommend her to follow-up with her gynecologist once a year, and watch for vaginal spotting or bleeding, as a clinically sign of endometrial cancer, etc. She voiced good understanding, and agrees to proceed.  She will start in 2 to 3 weeks after her dental procedure. -Alternative adjuvant endocrine therapy, such as aromatase inhibitor and overexpression were discussed with her also, given her young age.  However she is perimenopausal, likely will go through menopause soon, so the  benefit is small. -I discussed with the patient that in the next couple of years, when she is menopausal, then we would change her anti-estrogen treatment. The patient agrees to Tamoxifen use at this time and I advised that she take this medication 3 weeks following completion of  radiation therapy.  -Due to the patient have an upcoming dental procedure where she will have general anesthesia at the end of September/beginning of October, she will start her Tamoxifen following that procedure.  -Breast MRI in a month for screening  -Lab and F/U in 3 months   2. HTN   -I previously prescribed her 10 mg lisinopril on 02/10/18 and advised her to check her BP more regularly.   -I previously encouraged her to follow-up with her primary care physician to see if she needs high dose lisinopril.  3. 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 She takes oral iron daily and has had IV iron and blood transfusion in the past. Last transfusion in 2017 -On 364m Ferrous Sulfate daily.  -Will monitor her labs while she is being followed by me and I will consider IV iron if needed.   4. Hot Flashes, Mood disorder, insomnia  -Her last period was Dec 2018 and she has been having hot flashes since.  -I previously switched her Paxil to Effexor 37.5 mg in 01/2018 due to drug interaction with Tamoxifen.  -For her insomnia, I previously prescribed her Ambien 538mto start with half tablet to help her sleep. She is hesitant to continue due to side effects.    -I previously prescribed her Lunesta at 30m63mnd if not enough she can increase to 2mg87m will give her 1 months supply (04/12/18) -I again offered social work consult. She is interested, I will place referral.  -I will increase her Effexor to 75mg55mefilled on 04/12/18. She is doing better overall    Orders Placed This Encounter  Procedures  . MR Breast Bilateral Wo Contrast    Standing Status:   Future    Standing  Expiration Date:   06/28/2019    Order Specific Question:   What is the patient's sedation requirement?    Answer:   No Sedation    Order Specific Question:   Does the patient have a pacemaker or implanted devices?    Answer:   No    Order Specific Question:   Preferred imaging location?    Answer:   [facility] (table limit-550lbs)    Order Specific Question:   Radiology Contrast Protocol - do NOT remove file path    Answer:   [email or web address]   PLAN:  -Breast MRI in a month for screening  -she will start tamoxifen in 2 to 3 weeks after her dental procedure -Lab and F/U in 3 months    All questions were answered. The patient knows to call the clinic with any problems, questions or concerns. I spent 25 minutes counseling the patient face to face. The total time spent in the appointment was 30 minutes and more than 50% was on counseling.     Evanny Ellerbe Casey Merle9/07/2018   I, Soijett Blue am acting as scribe for Dr. Nyheim Seufert Casey Merlehave reviewed the above documentation for accuracy and completeness, and I agree with the above.

## 2018-06-27 NOTE — Progress Notes (Signed)
  Radiation Oncology         [phone] ________________________________  Name: Casey Baker MRN: [id number]  Date: 06/27/2018  DOB: Nov 19, 1970  End of Treatment Note  Diagnosis:   47 y.o. female with Clinical stage0 (Tis, Nx), Pathologic Stage IA (pT63m, pN1a)LeftBreast UOQMicroinvasive Ductal Carcinoma,ER(+)/ PR(+)/ Her2 (-),Grade 1  Indication for treatment:  Curative       Radiation treatment dates:   05/18/2018-06/27/2018  Site/dose:   1. Left CW, SCV and Axilla with 1.8 Gy in 25 fractions for a total dose of 45 Gy 2. Left CW (expander in place) boost, 1.8 Gy in 3 fractions for a total dose of 50.4 Gy  Beams/energy:   1. 3D, 10X//6X          2. 3D, 10X//6X         Narrative: The patient tolerated radiation treatment relatively well. At the beginning of her treatment, she denied pain or fatigue, however, she noted mild hyperpigmentation to the radiation site. She was given Radiaplex and instructed on its use. On PE, it was noted that the left chest showed reconstruction without any significant radiation reaction at that time. Towards the end of the treatment, she noted hyperpigmenation changes to the radiation site and pain with raising her left arm. She denied fatigue at this time. She was using Sonafine and hydrocortisone cream. On PE, it was noted hyperpigmentation changes to the left chest with dry desqumation without moist desquamation.    Plan: The patient has completed radiation treatment. The patient will return to radiation oncology clinic for routine followup in one month. I advised them to call or return sooner if they have any questions or concerns related to their recovery or treatment.  -----------------------------------  JBlair Promise PhD, MD  This document serves as a record of services personally performed by JGery Pray MD. It was created on his behalf [facility] a trained medical scribe. The creation of this record is based on the scribe's  personal observations and the provider's statements to them. This document has been checked and approved by the attending provider.

## 2018-06-28 ENCOUNTER — Inpatient Hospital Stay: Payer: Medicaid Other | Attending: Hematology

## 2018-06-28 ENCOUNTER — Encounter: Payer: Self-pay | Admitting: Hematology

## 2018-06-28 ENCOUNTER — Ambulatory Visit: Payer: Medicaid Other

## 2018-06-28 ENCOUNTER — Telehealth: Payer: Self-pay | Admitting: Hematology

## 2018-06-28 ENCOUNTER — Other Ambulatory Visit (HOSPITAL_COMMUNITY): Payer: Self-pay | Admitting: *Deleted

## 2018-06-28 ENCOUNTER — Inpatient Hospital Stay (HOSPITAL_BASED_OUTPATIENT_CLINIC_OR_DEPARTMENT_OTHER): Payer: Medicaid Other | Admitting: Hematology

## 2018-06-28 VITALS — BP 148/81 | HR 133 | Temp 98.4°F | Resp 20 | Ht 67.0 in | Wt 174.2 lb

## 2018-06-28 DIAGNOSIS — N951 Menopausal and female climacteric states: Secondary | ICD-10-CM | POA: Diagnosis not present

## 2018-06-28 DIAGNOSIS — Z1231 Encounter for screening mammogram for malignant neoplasm of breast: Secondary | ICD-10-CM

## 2018-06-28 DIAGNOSIS — I10 Essential (primary) hypertension: Secondary | ICD-10-CM | POA: Insufficient documentation

## 2018-06-28 DIAGNOSIS — F39 Unspecified mood [affective] disorder: Secondary | ICD-10-CM | POA: Diagnosis not present

## 2018-06-28 DIAGNOSIS — Z9012 Acquired absence of left breast and nipple: Secondary | ICD-10-CM | POA: Diagnosis not present

## 2018-06-28 DIAGNOSIS — Z862 Personal history of diseases of the blood and blood-forming organs and certain disorders involving the immune mechanism: Secondary | ICD-10-CM | POA: Insufficient documentation

## 2018-06-28 DIAGNOSIS — Z9882 Breast implant status: Secondary | ICD-10-CM

## 2018-06-28 DIAGNOSIS — Z923 Personal history of irradiation: Secondary | ICD-10-CM

## 2018-06-28 DIAGNOSIS — Z8041 Family history of malignant neoplasm of ovary: Secondary | ICD-10-CM | POA: Insufficient documentation

## 2018-06-28 DIAGNOSIS — Z79810 Long term (current) use of selective estrogen receptor modulators (SERMs): Secondary | ICD-10-CM | POA: Insufficient documentation

## 2018-06-28 DIAGNOSIS — D259 Leiomyoma of uterus, unspecified: Secondary | ICD-10-CM | POA: Insufficient documentation

## 2018-06-28 DIAGNOSIS — G4700 Insomnia, unspecified: Secondary | ICD-10-CM | POA: Diagnosis not present

## 2018-06-28 DIAGNOSIS — Z803 Family history of malignant neoplasm of breast: Secondary | ICD-10-CM

## 2018-06-28 DIAGNOSIS — D0512 Intraductal carcinoma in situ of left breast: Secondary | ICD-10-CM

## 2018-06-28 DIAGNOSIS — Z170 Estrogen receptor positive status [ER+]: Secondary | ICD-10-CM

## 2018-06-28 DIAGNOSIS — Z79899 Other long term (current) drug therapy: Secondary | ICD-10-CM | POA: Insufficient documentation

## 2018-06-28 DIAGNOSIS — R921 Mammographic calcification found on diagnostic imaging of breast: Secondary | ICD-10-CM | POA: Diagnosis not present

## 2018-06-28 DIAGNOSIS — Z0000 Encounter for general adult medical examination without abnormal findings: Secondary | ICD-10-CM

## 2018-06-28 DIAGNOSIS — C50412 Malignant neoplasm of upper-outer quadrant of left female breast: Secondary | ICD-10-CM

## 2018-06-28 LAB — CMP (CANCER CENTER ONLY)
ALBUMIN: 3.9 g/dL (ref 3.5–5.0)
ALT: 8 U/L (ref 0–44)
AST: 12 U/L — AB (ref 15–41)
Alkaline Phosphatase: 77 U/L (ref 38–126)
Anion gap: 10 (ref 5–15)
BUN: 8 mg/dL (ref 6–20)
CHLORIDE: 104 mmol/L (ref 98–111)
CO2: 26 mmol/L (ref 22–32)
CREATININE: 0.88 mg/dL (ref 0.44–1.00)
Calcium: 9.5 mg/dL (ref 8.9–10.3)
GFR, Estimated: >60 mL/min (ref >60)
Glucose, Bld: 106 mg/dL — ABNORMAL HIGH (ref 70–99)
POTASSIUM: 4.1 mmol/L (ref 3.5–5.1)
SODIUM: 140 mmol/L (ref 135–145)
Total Bilirubin: 0.6 mg/dL (ref 0.3–1.2)
Total Protein: 7.6 g/dL (ref 6.5–8.1)

## 2018-06-28 LAB — CBC WITH DIFFERENTIAL (CANCER CENTER ONLY)
Basophils Absolute: 0 10*3/uL (ref 0.0–0.1)
Basophils Relative: 1 %
Eosinophils Absolute: 0.1 10*3/uL (ref 0.0–0.5)
Eosinophils Relative: 4 %
HEMATOCRIT: 36.7 % (ref 34.8–46.6)
HEMOGLOBIN: 11.8 g/dL (ref 11.6–15.9)
LYMPHS ABS: 0.5 10*3/uL — AB (ref 0.9–3.3)
LYMPHS PCT: 19 %
MCH: 26.6 pg (ref 25.1–34.0)
MCHC: 32.2 g/dL (ref 31.5–36.0)
MCV: 82.4 fL (ref 79.5–101.0)
MONOS PCT: 13 %
Monocytes Absolute: 0.3 10*3/uL (ref 0.1–0.9)
NEUTROS PCT: 63 %
Neutro Abs: 1.7 10*3/uL (ref 1.5–6.5)
Platelet Count: 263 10*3/uL (ref 145–400)
RBC: 4.46 MIL/uL (ref 3.70–5.45)
RDW: 20.3 % — ABNORMAL HIGH (ref 11.2–14.5)
WBC Count: 2.6 10*3/uL — ABNORMAL LOW (ref 3.9–10.3)

## 2018-06-28 LAB — LIPID PANEL
Cholesterol: 197 mg/dL (ref 0–200)
HDL: 38 mg/dL — ABNORMAL LOW (ref >40)
LDL CALC: 141 mg/dL — AB (ref 0–99)
TRIGLYCERIDES: 88 mg/dL (ref <150)
Total CHOL/HDL Ratio: 5.2 RATIO
VLDL: 18 mg/dL (ref 0–40)

## 2018-06-28 MED ORDER — TAMOXIFEN CITRATE 20 MG PO TABS: 20.0000 mg | ORAL_TABLET | Freq: Every day | ORAL | 2 refills | Status: DC

## 2018-06-28 NOTE — Addendum Note (Signed)
Addended by: Unice Bailey B on: 06/28/2018 08:39 AM   Modules accepted: Orders

## 2018-06-28 NOTE — Telephone Encounter (Signed)
Scheduled apt per 9/10 los - gave patient AVS and calender per los.   

## 2018-06-29 ENCOUNTER — Ambulatory Visit: Payer: Medicaid Other

## 2018-06-29 LAB — MISC LABCORP TEST (SEND OUT): LABCORP TEST CODE: 1453

## 2018-06-30 ENCOUNTER — Ambulatory Visit: Payer: Medicaid Other

## 2018-07-01 ENCOUNTER — Inpatient Hospital Stay: Payer: Medicaid Other | Admitting: *Deleted

## 2018-07-01 ENCOUNTER — Ambulatory Visit: Payer: Medicaid Other

## 2018-07-01 ENCOUNTER — Inpatient Hospital Stay: Payer: Medicaid Other

## 2018-07-01 ENCOUNTER — Other Ambulatory Visit (HOSPITAL_COMMUNITY): Payer: Self-pay | Admitting: *Deleted

## 2018-07-01 VITALS — BP 125/75 | Ht 67.0 in | Wt 175.0 lb

## 2018-07-01 DIAGNOSIS — Z0000 Encounter for general adult medical examination without abnormal findings: Secondary | ICD-10-CM

## 2018-07-01 DIAGNOSIS — D0512 Intraductal carcinoma in situ of left breast: Secondary | ICD-10-CM | POA: Diagnosis not present

## 2018-07-01 LAB — HEMOGLOBIN A1C
Hgb A1c MFr Bld: 6.1 % — ABNORMAL HIGH (ref 4.8–5.6)
MEAN PLASMA GLUCOSE: 128.37 mg/dL

## 2018-07-01 NOTE — Addendum Note (Signed)
Addended by: Ena Dawley on: 07/01/2018 10:00 AM   Modules accepted: Orders

## 2018-07-01 NOTE — Progress Notes (Signed)
[facility] initial screening  Clinical Measurement:  Height: 67in   Weight: 175lb  Blood Pressure: 130/70   Blood Pressure #2: 125/75   Fasting Labs Drawn Today, will review with patient when they result.  Medical History:  Patient states that she has not been diagnosed with high cholesterol, diabetes or heart disease.  Patient states she has been diagnosed with  high blood pressure.  Medications:  Patients states she is not taking any medications for high cholesterol or diabetes.  She is not taking aspirin daily to prevent heart attack or stroke.  Patient state she has been taking medicine for high blood pressure.   Blood pressure, self measurement:  Patients states she does measure blood pressure at home weekly.  Nutrition:  Patient states she eats 2 cups of fruit and 3 cups of vegetables in an average day.  Patient states she does  eat fish regularly, she eats about a half a serving of whole grains daily. She drinks less than 36 ounces of beverages with added sugar weekly.  She is currently watching her sodium intake.  She has not had any drinks containing alcohol in the last seven days.    Physical activity:  Patient states that she gets 0 minutes of moderate exercise in a week.  She gets 0 minutes of vigorous exercise per week.    Smoking status:  Patient states she has never smoked and is not around any smokers.    Quality of life:  Patient states that she has had 0 bad physical days out of the last 30 days. In the last 2 weeks, she has had 0 days that she has felt down or depressed. She has had 0 days in the last 2 weeks that she has had little interest or pleasure in doing things.  Risk reduction and counseling:  Patient states she wants to lose weight and increase fruit and vegetable intake.  I encouraged her to increase her  current exercise regimen and increase vegetable and fruit intake.  Navigation:  I will notify patient of lab results.  Patient is aware of 2 more health coaching  sessions and a follow up.

## 2018-07-04 ENCOUNTER — Telehealth (HOSPITAL_COMMUNITY): Payer: Self-pay | Admitting: *Deleted

## 2018-07-04 ENCOUNTER — Ambulatory Visit: Payer: Medicaid Other

## 2018-07-04 NOTE — Telephone Encounter (Signed)
  Health Coaching 2   Labs- LDL cholesterol 141, HDL cholesterol 38, cholesterol 197, triglycerides 88, hemoglobin A1C 6.1, mean plasma glucose 128.37   Patient is aware and understands her lab results.   Goals- Patient states she wants to have better eating habits.  I encouraged patient to refrain from eating foods high in trans fat such as potato chips and other prepackaged snacks.  I encouraged patient to snack on small amounts of almonds (12-20) or walnuts(12-20). I also encouraged her to eat more lean meats, fish and skinless chicken.  Navigation: Patient is aware of 1 more health coaching sessions and a follow up.  Time- 10 minutes

## 2018-07-05 ENCOUNTER — Telehealth: Payer: Self-pay | Admitting: Nurse Practitioner

## 2018-07-05 ENCOUNTER — Ambulatory Visit: Payer: Medicaid Other

## 2018-07-05 ENCOUNTER — Telehealth: Payer: Self-pay | Admitting: Hematology

## 2018-07-05 ENCOUNTER — Encounter: Payer: Self-pay | Admitting: *Deleted

## 2018-07-05 ENCOUNTER — Telehealth: Payer: Self-pay

## 2018-07-05 ENCOUNTER — Other Ambulatory Visit: Payer: Self-pay

## 2018-07-05 DIAGNOSIS — C50412 Malignant neoplasm of upper-outer quadrant of left female breast: Secondary | ICD-10-CM

## 2018-07-05 DIAGNOSIS — Z170 Estrogen receptor positive status [ER+]: Principal | ICD-10-CM

## 2018-07-05 NOTE — Telephone Encounter (Signed)
Pt scheduled per 9/17 sch message. Pt aware of d&t.

## 2018-07-05 NOTE — Telephone Encounter (Signed)
Patient calls stating would like an appointment to discuss concerns/questions prior to starting Tamoxifen.  Send scheduling message.

## 2018-07-05 NOTE — Telephone Encounter (Signed)
Mailed pt calendar of upcoming appts per 9/17 sch message

## 2018-07-06 ENCOUNTER — Ambulatory Visit: Payer: Medicaid Other

## 2018-07-06 NOTE — H&P (Signed)
HISTORY AND PHYSICAL  Casey Baker is a 47 y.o. female patient with CH:ENIDPOE teeth  No diagnosis found.  Past Medical History:  Diagnosis Date  . Anemia   . Anxiety   . 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 Family history of breast cancer   . Family history of ovarian cancer   . Genetic testing 02/21/2018   STAT Breast panel with reflex to Breast/GYN panel (23 genes) @ [facility] - No pathogenic mutations detected  . Hypertension   . PONV (postoperative nausea and vomiting)   . Uterine fibroid     No current facility-administered medications for this encounter.    Current Outpatient Medications  Medication Sig Dispense Refill  . Aspirin-Acetaminophen-Caffeine (GOODY HEADACHE PO) Take 1 packet by mouth daily as needed (headache).     . cyclobenzaprine (FLEXERIL) 10 MG tablet Take 1 tablet (10 mg total) by mouth 3 (three) times daily as needed for muscle spasms. 30 tablet 0  . lisinopril (PRINIVIL,ZESTRIL) 10 MG tablet Take 1 tablet (10 mg total) by mouth daily. 90 tablet 1  . ferrous sulfate 325 (65 FE) MG tablet Take 1 tablet (325 mg total) by mouth daily with breakfast. (Patient not taking: Reported on 06/17/2018) 30 tablet 3  . oxyCODONE (ROXICODONE) 5 MG immediate release tablet Take 1 tablet (5 mg total) by mouth every 4 (four) hours as needed for severe pain. 40 tablet 0  . tamoxifen (NOLVADEX) 20 MG tablet Take 1 tablet (20 mg total) by mouth daily. 30 tablet 2  . venlafaxine XR (EFFEXOR-XR) 75 MG 24 hr capsule Take 1 capsule (75 mg total) by mouth daily with breakfast. (Patient not taking: Reported on 06/17/2018) 30 capsule 2   No Known Allergies Active Problems:   * No active hospital problems. *  Vitals: Last menstrual period 11/17/2017. Lab results:No results found for this or any previous visit (from the past 63 hour(s)). Radiology Results: No results found. General appearance: alert, cooperative and no distress Head: Normocephalic, without  obvious abnormality, atraumatic Eyes: negative Nose: Nares normal. Septum midline. Mucosa normal. No drainage or sinus tenderness. Throat: multiple carious teeth @ 2, 4, 5, 6, 7, 8., 9, 10, 11, 12, 14, 15, 21, 28, 29, 30. No purulence, edema. Pharynx clear, no trismus Neck: no adenopathy, supple, symmetrical, trachea midline and thyroid not enlarged, symmetric, no tenderness/mass/nodules Resp: clear to auscultation bilaterally Cardio: regular rate and rhythm, S1, S2 normal, no murmur, click, rub or gallop  Assessment:Multiple nonrestorable teeth secondary to dental caries  Plan: Multiple extractions with alveoloplasty. GA. Day surgery.   Diona Browner 07/06/2018

## 2018-07-07 ENCOUNTER — Encounter (HOSPITAL_COMMUNITY): Payer: Self-pay | Admitting: Urology

## 2018-07-07 ENCOUNTER — Other Ambulatory Visit: Payer: Self-pay

## 2018-07-07 NOTE — Anesthesia Preprocedure Evaluation (Addendum)
Anesthesia Evaluation  Patient identified by MRN, date of birth, ID band Patient awake    Reviewed: Allergy & Precautions, NPO status , Patient's Chart, lab work & pertinent test results  History of Anesthesia Complications (+) PONV  Airway Mallampati: II  TM Distance: >3 FB Neck ROM: Full    Dental no notable dental hx. (+) Poor Dentition   Pulmonary neg pulmonary ROS,    Pulmonary exam normal breath sounds clear to auscultation       Cardiovascular Exercise Tolerance: Good hypertension, Pt. on medications Normal cardiovascular exam Rhythm:Regular Rate:Normal     Neuro/Psych Anxiety negative neurological ROS     GI/Hepatic   Endo/Other    Renal/GU      Musculoskeletal   Abdominal   Peds  Hematology  (+) anemia ,   Anesthesia Other Findings   Reproductive/Obstetrics                            Anesthesia Physical Anesthesia Plan  ASA: II  Anesthesia Plan: General   Post-op Pain Management:    Induction: Intravenous  PONV Risk Score and Plan: 4 or greater and Treatment may vary due to age or medical condition, Ondansetron, Dexamethasone and Scopolamine patch - Pre-op  Airway Management Planned: Nasal ETT  Additional Equipment:   Intra-op Plan:   Post-operative Plan: Extubation in OR  Informed Consent: I have reviewed the patients History and Physical, chart, labs and discussed the procedure including the risks, benefits and alternatives for the proposed anesthesia with the patient or authorized representative who has indicated his/her understanding and acceptance.   Dental advisory given  Plan Discussed with: CRNA  Anesthesia Plan Comments: (UP)        Anesthesia Quick Evaluation

## 2018-07-08 ENCOUNTER — Ambulatory Visit (HOSPITAL_COMMUNITY): Payer: Medicaid Other | Admitting: Anesthesiology

## 2018-07-08 ENCOUNTER — Encounter (HOSPITAL_COMMUNITY)
Admission: RE | Disposition: A | Discharge status: Home / Self Care | Payer: Self-pay | Source: Ambulatory Visit | Attending: Oral Surgery

## 2018-07-08 ENCOUNTER — Encounter (HOSPITAL_COMMUNITY): Payer: Self-pay

## 2018-07-08 ENCOUNTER — Ambulatory Visit (HOSPITAL_COMMUNITY)
Admission: RE | Admit: 2018-07-08 | Discharge: 2018-07-08 | Disposition: A | Discharge status: Home / Self Care | Payer: Medicaid Other | Source: Ambulatory Visit | Attending: Oral Surgery | Admitting: Oral Surgery

## 2018-07-08 ENCOUNTER — Ambulatory Visit: Payer: Medicaid Other | Admitting: Nurse Practitioner

## 2018-07-08 DIAGNOSIS — F419 Anxiety disorder, unspecified: Secondary | ICD-10-CM | POA: Diagnosis not present

## 2018-07-08 DIAGNOSIS — Z7982 Long term (current) use of aspirin: Secondary | ICD-10-CM | POA: Insufficient documentation

## 2018-07-08 DIAGNOSIS — Z79899 Other long term (current) drug therapy: Secondary | ICD-10-CM | POA: Diagnosis not present

## 2018-07-08 DIAGNOSIS — Z853 Personal history of malignant neoplasm of breast: Secondary | ICD-10-CM | POA: Diagnosis not present

## 2018-07-08 DIAGNOSIS — K029 Dental caries, unspecified: Secondary | ICD-10-CM | POA: Diagnosis present

## 2018-07-08 DIAGNOSIS — K0530 Chronic periodontitis, unspecified: Secondary | ICD-10-CM | POA: Diagnosis not present

## 2018-07-08 HISTORY — PX: TOOTH EXTRACTION: SHX859

## 2018-07-08 HISTORY — PX: DENTAL SURGERY: SHX609

## 2018-07-08 LAB — POCT PREGNANCY, URINE: Preg Test, Ur: NEGATIVE

## 2018-07-08 SURGERY — DENTAL RESTORATION/EXTRACTIONS
Anesthesia: General

## 2018-07-08 MED ORDER — MIDAZOLAM HCL 2 MG/2ML IJ SOLN
INTRAMUSCULAR | Status: AC
  Filled 2018-07-08: qty 2

## 2018-07-08 MED ORDER — FENTANYL CITRATE (PF) 250 MCG/5ML IJ SOLN
INTRAMUSCULAR | Status: AC
  Filled 2018-07-08: qty 5

## 2018-07-08 MED ORDER — OXYMETAZOLINE HCL 0.05 % NA SOLN
NASAL | Status: DC | PRN
  Administered 2018-07-08: 2 via NASAL

## 2018-07-08 MED ORDER — PROPOFOL 10 MG/ML IV BOLUS
INTRAVENOUS | Status: AC
  Filled 2018-07-08: qty 20

## 2018-07-08 MED ORDER — LIDOCAINE-EPINEPHRINE 2 %-1:100000 IJ SOLN
INTRAMUSCULAR | Status: AC
  Filled 2018-07-08: qty 1

## 2018-07-08 MED ORDER — HYDROCODONE-ACETAMINOPHEN 7.5-325 MG PO TABS: 1.0000 | ORAL_TABLET | Freq: Once | ORAL | Status: DC | PRN

## 2018-07-08 MED ORDER — AMOXICILLIN 500 MG PO CAPS: 500.0000 mg | ORAL_CAPSULE | Freq: Three times a day (TID) | ORAL | 0 refills | Status: DC

## 2018-07-08 MED ORDER — OXYCODONE-ACETAMINOPHEN 10-325 MG PO TABS: 1.0000 | ORAL_TABLET | Freq: Four times a day (QID) | ORAL | 0 refills | Status: DC | PRN

## 2018-07-08 MED ORDER — LIDOCAINE HCL (CARDIAC) PF 100 MG/5ML IV SOSY
PREFILLED_SYRINGE | INTRAVENOUS | Status: DC | PRN
  Administered 2018-07-08: 60 mg via INTRAVENOUS

## 2018-07-08 MED ORDER — ROCURONIUM BROMIDE 50 MG/5ML IV SOSY
PREFILLED_SYRINGE | INTRAVENOUS | Status: AC
  Filled 2018-07-08: qty 5

## 2018-07-08 MED ORDER — LIDOCAINE 2% (20 MG/ML) 5 ML SYRINGE
INTRAMUSCULAR | Status: AC
  Filled 2018-07-08: qty 5

## 2018-07-08 MED ORDER — MEPERIDINE HCL 50 MG/ML IJ SOLN: 6.2500 mg | INTRAMUSCULAR | Status: DC | PRN

## 2018-07-08 MED ORDER — LIDOCAINE-EPINEPHRINE 2 %-1:100000 IJ SOLN
INTRAMUSCULAR | Status: DC | PRN
  Administered 2018-07-08: 15 mL via INTRADERMAL

## 2018-07-08 MED ORDER — CEFAZOLIN SODIUM-DEXTROSE 2-4 GM/100ML-% IV SOLN
2.0000 g | INTRAVENOUS | Status: AC
Start: 2018-07-08 — End: 2018-07-08
  Administered 2018-07-08: 2 g via INTRAVENOUS
  Filled 2018-07-08: qty 100

## 2018-07-08 MED ORDER — PHENYLEPHRINE 40 MCG/ML (10ML) SYRINGE FOR IV PUSH (FOR BLOOD PRESSURE SUPPORT)
PREFILLED_SYRINGE | INTRAVENOUS | Status: AC
  Filled 2018-07-08: qty 10

## 2018-07-08 MED ORDER — ACETAMINOPHEN 500 MG PO TABS
1000.0000 mg | ORAL_TABLET | Freq: Once | ORAL | Status: AC
  Administered 2018-07-08: 1000 mg via ORAL
  Filled 2018-07-08: qty 2

## 2018-07-08 MED ORDER — FENTANYL CITRATE (PF) 100 MCG/2ML IJ SOLN
INTRAMUSCULAR | Status: DC | PRN
  Administered 2018-07-08: 100 ug via INTRAVENOUS

## 2018-07-08 MED ORDER — SODIUM CHLORIDE 0.9 % IR SOLN
Status: DC | PRN
  Administered 2018-07-08: 1000 mL

## 2018-07-08 MED ORDER — ONDANSETRON HCL 4 MG/2ML IJ SOLN
INTRAMUSCULAR | Status: AC
  Filled 2018-07-08: qty 2

## 2018-07-08 MED ORDER — SCOPOLAMINE 1 MG/3DAYS TD PT72
MEDICATED_PATCH | TRANSDERMAL | Status: AC
  Filled 2018-07-08: qty 1

## 2018-07-08 MED ORDER — DEXAMETHASONE SODIUM PHOSPHATE 10 MG/ML IJ SOLN
INTRAMUSCULAR | Status: DC | PRN
  Administered 2018-07-08: 10 mg via INTRAVENOUS

## 2018-07-08 MED ORDER — ALBUTEROL SULFATE HFA 108 (90 BASE) MCG/ACT IN AERS: INHALATION_SPRAY | RESPIRATORY_TRACT | Status: DC | PRN

## 2018-07-08 MED ORDER — ACETAMINOPHEN 10 MG/ML IV SOLN
INTRAVENOUS | Status: AC
  Filled 2018-07-08: qty 100

## 2018-07-08 MED ORDER — ACETAMINOPHEN 10 MG/ML IV SOLN
1000.0000 mg | Freq: Once | INTRAVENOUS | Status: DC | PRN
  Administered 2018-07-08: 1000 mg via INTRAVENOUS

## 2018-07-08 MED ORDER — PHENYLEPHRINE HCL 10 MG/ML IJ SOLN
INTRAMUSCULAR | Status: DC | PRN
  Administered 2018-07-08: 120 ug via INTRAVENOUS

## 2018-07-08 MED ORDER — EPHEDRINE SULFATE 50 MG/ML IJ SOLN: INTRAMUSCULAR | Status: DC | PRN

## 2018-07-08 MED ORDER — MIDAZOLAM HCL 5 MG/5ML IJ SOLN
INTRAMUSCULAR | Status: DC | PRN
  Administered 2018-07-08: 2 mg via INTRAVENOUS

## 2018-07-08 MED ORDER — ESMOLOL HCL 100 MG/10ML IV SOLN
INTRAVENOUS | Status: DC | PRN
  Administered 2018-07-08: 20 mg via INTRAVENOUS

## 2018-07-08 MED ORDER — ROCURONIUM BROMIDE 100 MG/10ML IV SOLN
INTRAVENOUS | Status: DC | PRN
  Administered 2018-07-08: 50 mg via INTRAVENOUS

## 2018-07-08 MED ORDER — PROMETHAZINE HCL 25 MG/ML IJ SOLN: 6.2500 mg | INTRAMUSCULAR | Status: DC | PRN

## 2018-07-08 MED ORDER — SUGAMMADEX SODIUM 200 MG/2ML IV SOLN
INTRAVENOUS | Status: DC | PRN
  Administered 2018-07-08: 200 mg via INTRAVENOUS

## 2018-07-08 MED ORDER — PROPOFOL 10 MG/ML IV BOLUS
INTRAVENOUS | Status: DC | PRN
  Administered 2018-07-08: 160 mg via INTRAVENOUS

## 2018-07-08 MED ORDER — LACTATED RINGERS IV SOLN
INTRAVENOUS | Status: DC
  Administered 2018-07-08 (×2): via INTRAVENOUS

## 2018-07-08 MED ORDER — HYDROMORPHONE HCL 1 MG/ML IJ SOLN
0.2500 mg | INTRAMUSCULAR | Status: DC | PRN
  Administered 2018-07-08: 0.25 mg via INTRAVENOUS

## 2018-07-08 MED ORDER — ONDANSETRON HCL 4 MG/2ML IJ SOLN
INTRAMUSCULAR | Status: DC | PRN
  Administered 2018-07-08: 4 mg via INTRAVENOUS

## 2018-07-08 MED ORDER — DEXAMETHASONE SODIUM PHOSPHATE 10 MG/ML IJ SOLN
INTRAMUSCULAR | Status: AC
  Filled 2018-07-08: qty 2

## 2018-07-08 MED ORDER — SUCCINYLCHOLINE CHLORIDE 200 MG/10ML IV SOSY
PREFILLED_SYRINGE | INTRAVENOUS | Status: AC
  Filled 2018-07-08: qty 10

## 2018-07-08 MED ORDER — EPHEDRINE 5 MG/ML INJ
INTRAVENOUS | Status: AC
  Filled 2018-07-08: qty 10

## 2018-07-08 MED ORDER — HYDROMORPHONE HCL 1 MG/ML IJ SOLN
INTRAMUSCULAR | Status: AC
  Filled 2018-07-08: qty 1

## 2018-07-08 MED ORDER — OXYMETAZOLINE HCL 0.05 % NA SOLN
NASAL | Status: AC
  Filled 2018-07-08: qty 15

## 2018-07-08 MED ORDER — OXYMETAZOLINE HCL 0.05 % NA SOLN
NASAL | Status: DC | PRN
  Administered 2018-07-08: 1 via TOPICAL

## 2018-07-08 MED ORDER — SCOPOLAMINE 1 MG/3DAYS TD PT72
MEDICATED_PATCH | TRANSDERMAL | Status: DC | PRN
  Administered 2018-07-08: 1 via TRANSDERMAL

## 2018-07-08 MED ORDER — 0.9 % SODIUM CHLORIDE (POUR BTL) OPTIME
TOPICAL | Status: DC | PRN
  Administered 2018-07-08: 1000 mL

## 2018-07-08 SURGICAL SUPPLY — 34 items
BLADE SURG 15 STRL LF DISP TIS (BLADE) ×1 IMPLANT
BLADE SURG 15 STRL SS (BLADE) ×1
BUR CROSS CUT FISSURE 1.6 (BURR) ×2 IMPLANT
BUR EGG ELITE 4.0 (BURR) ×2 IMPLANT
CANISTER SUCT 3000ML PPV (MISCELLANEOUS) ×2 IMPLANT
COVER SURGICAL LIGHT HANDLE (MISCELLANEOUS) ×2 IMPLANT
DECANTER SPIKE VIAL GLASS SM (MISCELLANEOUS) ×2 IMPLANT
DRAPE U-SHAPE 76X120 STRL (DRAPES) ×2 IMPLANT
GAUZE PACKING FOLDED 2  STR (GAUZE/BANDAGES/DRESSINGS) ×1
GAUZE PACKING FOLDED 2 STR (GAUZE/BANDAGES/DRESSINGS) ×1 IMPLANT
GLOVE BIO SURGEON STRL SZ 6.5 (GLOVE) IMPLANT
GLOVE BIO SURGEON STRL SZ7 (GLOVE) IMPLANT
GLOVE BIO SURGEON STRL SZ7.5 (GLOVE) ×2 IMPLANT
GLOVE BIOGEL PI IND STRL 6.5 (GLOVE) IMPLANT
GLOVE BIOGEL PI IND STRL 7.0 (GLOVE) IMPLANT
GLOVE BIOGEL PI INDICATOR 6.5 (GLOVE)
GLOVE BIOGEL PI INDICATOR 7.0 (GLOVE)
GOWN STRL REUS W/ TWL LRG LVL3 (GOWN DISPOSABLE) ×1 IMPLANT
GOWN STRL REUS W/ TWL XL LVL3 (GOWN DISPOSABLE) ×1 IMPLANT
GOWN STRL REUS W/TWL LRG LVL3 (GOWN DISPOSABLE) ×1
GOWN STRL REUS W/TWL XL LVL3 (GOWN DISPOSABLE) ×1
IV NS 1000ML (IV SOLUTION) ×1
IV NS 1000ML BAXH (IV SOLUTION) ×1 IMPLANT
KIT BASIN OR (CUSTOM PROCEDURE TRAY) ×2 IMPLANT
KIT TURNOVER KIT B (KITS) ×2 IMPLANT
NEEDLE 22X1 1/2 (OR ONLY) (NEEDLE) ×4 IMPLANT
NS IRRIG 1000ML POUR BTL (IV SOLUTION) ×2 IMPLANT
PAD ARMBOARD 7.5X6 YLW CONV (MISCELLANEOUS) ×2 IMPLANT
SPONGE SURGIFOAM ABS GEL 12-7 (HEMOSTASIS) IMPLANT
SUT CHROMIC 3 0 PS 2 (SUTURE) ×2 IMPLANT
SYR CONTROL 10ML LL (SYRINGE) ×2 IMPLANT
TRAY ENT MC OR (CUSTOM PROCEDURE TRAY) ×2 IMPLANT
TUBING IRRIGATION (MISCELLANEOUS) ×2 IMPLANT
YANKAUER SUCT BULB TIP NO VENT (SUCTIONS) ×2 IMPLANT

## 2018-07-08 NOTE — Discharge Instructions (Signed)

## 2018-07-08 NOTE — Anesthesia Procedure Notes (Signed)
Procedure Name: Intubation Date/Time: 07/08/2018 10:27 AM Performed by: Shirlyn Goltz, CRNA Pre-anesthesia Checklist: Patient identified, Emergency Drugs available, Suction available and Patient being monitored Patient Re-evaluated:Patient Re-evaluated prior to induction Oxygen Delivery Method: Circle system utilized Preoxygenation: Pre-oxygenation with 100% oxygen Induction Type: IV induction Ventilation: Mask ventilation without difficulty Laryngoscope Size: Mac and 3 Grade View: Grade I Nasal Tubes: Right, Nasal prep performed and Nasal Rae Tube size: 7.0 mm Number of attempts: 1 Placement Confirmation: ETT inserted through vocal cords under direct vision,  positive ETCO2 and breath sounds checked- equal and bilateral Tube secured with: Tape Dental Injury: Teeth and Oropharynx as per pre-operative assessment

## 2018-07-08 NOTE — Op Note (Signed)
07/08/2018  11:08 AM  PATIENT:  Casey Baker  47 y.o. female  PRE-OPERATIVE DIAGNOSIS:  NONRESTORABLE TEETH # 2, 4, 5, 6, 7, 8, 9, 10, 11, 12, 14, 15, 21, 28, 29, 30  POST-OPERATIVE DIAGNOSIS:  SAME  PROCEDURE:  Procedure(s): EXTRACTION:  TEETH # 2, 4, 5, 6, 7, 8, 9, 10, 11, 12, 14, 15, 21, 28, 29, 30, ALVEOLOPLASTY INSERTION IMMEDIATE MAXILLARY UPPER DENTURE  SURGEON:  Surgeon(s): Diona Browner, DDS  ANESTHESIA:   local and general  EBL:  minimal  DRAINS: none   SPECIMEN:  No Specimen  COUNTS:  YES  PLAN OF CARE: Discharge to home after PACU  PATIENT DISPOSITION:  PACU - hemodynamically stable.   PROCEDURE DETAILS: Dictation # [id number]  Gae Bon, DMD 07/08/2018 11:08 AM

## 2018-07-08 NOTE — Transfer of Care (Signed)
Immediate Anesthesia Transfer of Care Note  Patient: Casey Baker  Procedure(s) Performed: DENTAL RESTORATION/EXTRACTIONS (N/A )  Patient Location: PACU  Anesthesia Type:General  Level of Consciousness: awake, alert , oriented and patient cooperative  Airway & Oxygen Therapy: Patient Spontanous Breathing and Patient connected to nasal cannula oxygen  Post-op Assessment: Report given to RN and Post -op Vital signs reviewed and stable  Post vital signs: Reviewed and stable  Last Vitals:  Vitals Value Taken Time  BP 126/71 07/08/2018 11:20 AM  Temp    Pulse 103 07/08/2018 11:22 AM  Resp 11 07/08/2018 11:22 AM  SpO2 99 % 07/08/2018 11:22 AM  Vitals shown include unvalidated device data.  Last Pain:  Vitals:   07/08/18 0804  TempSrc: Oral  PainSc:          Complications: No apparent anesthesia complications

## 2018-07-08 NOTE — Op Note (Signed)
Casey Baker, Casey Baker MEDICAL RECORD [id number] ACCOUNT [id number] DATE OF BIRTH:November 23, 1970 FACILITY: [facility] LOCATION: [facility] PHYSICIAN:Geramy Lamorte M. 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  PROCEDURE:  Extraction of teeth numbers 2, 4, 5, 6, 7, 8, 9, 10, 11, 12, 14, 15, 21, 28, 29, 30; alveoplasty right and left maxilla, right mandible, insertion immediate maxillary upper denture.  SURGEON:  Diona Browner, DDS  ANESTHESIA:  General nasal intubation, Houser attending.  DESCRIPTION OF PROCEDURE:  The patient was taken to the operating room and placed on the table in supine position.  General anesthesia was administered intravenously, and a nasal endotracheal tube was placed and secured.  The eyes were protected.  The  patient was draped for surgery.  A timeout was performed.  The posterior pharynx was suctioned, and a throat pack was placed.  Lidocaine 2% 1:100,000 epinephrine was infiltrated in the inferior alveolar block on the right and left mandible and in buccal  and palatal infiltration in the maxilla around the teeth to be removed.  A total of 16 mL was utilized.  A bite block was placed on the right side of the mouth and left side was operated on first.  A sweetheart retractor was used to retract the tongue.   An incision was created around tooth #21, and the tooth was elevated and removed with a periosteal elevator.  Then, an incision was made in the buccal and palatal sulcus around teeth numbers 15, 14, 12, 11, 10, 9, 8, 7.  The periosteum was reflected from  around these teeth.  Attempt was made to remove the posterior teeth with the forceps, but this  was not possible as the teeth had severe decay and a purchase was not obtained on the teeth.  Therefore, teeth numbers 14 and 15 were sectioned, and the 3  roots of each tooth were removed with a rongeur.  Tooth #12 fractured upon removal, necessitating removal of additional bone and retrieval of the buccal root with the root tip pick and hemostats.  Tooth #11 required circumferential bone removal around  this tooth before it could be extracted with a forcep.  Teeth numbers 7, 8, 9, 10 were removed with the Universal forceps after elevating with a 301 elevator.  Then, the sockets were curetted.  No suture was placed at this time, and bite block was  repositioned into this side of the mouth.  Attention was turned to the right mandible.  A 15 blade was used to make an incision around tooth numbers 28, 29, 30.  The periosteum was reflected.  Tooth #30 roots were easily removed with a rongeur.  However,  teeth numbers 28 and 29 had inadequate tooth structure to gain a purchase with the forceps.  Additional bone was removed circumferentially around these 2 roots, and the roots were elevated with a 301 elevator and removed with a rongeur.  Then, in the  right maxilla, an incision was made around teeth numbers 2, 4, 5 and 6.  The periosteum was reflected with the periosteal elevator.  The teeth were elevated with a 301 elevator.  Teeth numbers 2, 4 and 5 were removed with the  dental forceps.   Circumferential bone was removed around tooth #6 due to significant decay.  The tooth was then removed with the upper Universal forceps.  Then, the teeth on the right side were curetted.  The periosteum was reflected in the right mandible.  An  alveoplasty was performed using an egg-shaped bur and bone filed and the area was irrigated and closed with 3-0 chromic.  Then, the maxillary immediate denture which had been pre-prepared by the referring dentist lab was inserted in the mouth.  There  were some areas that were felt to  be high in contact and precluding complete seating of the denture.  The periosteum was reflected on the right and left side of the maxilla buccally, and then alveoplasty was performed.  A bone file was used after using  the egg-shaped bur.  The teeth were readapted, and several other small areas were trimmed with the egg-shaped bur, and it was felt that the seating was satisfactory.  The maxilla was irrigated and closed with 3-0 chromic.  Then, the oral cavity was  irrigated and suctioned.  The upper denture was placed in the mouth, and the patient was left in the care of anesthesia for awakening, extubation and transport to recovery room with plans for discharge home through day surgery.  ESTIMATED BLOOD LOSS:  Minimal.  COMPLICATIONS:  None.  SPECIMENS:  None.  LN/NUANCE  D:07/08/2018 T:07/08/2018 JOB:[id number]

## 2018-07-08 NOTE — H&P (Signed)
H&P documentation  -History and Physical Reviewed  -Patient has been re-examined  -No change in the plan of care  Casey Baker  

## 2018-07-08 NOTE — Anesthesia Postprocedure Evaluation (Signed)
Anesthesia Post Note  Patient: Casey Baker  Procedure(s) Performed: DENTAL RESTORATION/EXTRACTIONS (N/A )     Patient location during evaluation: PACU Anesthesia Type: General Level of consciousness: awake and alert Pain management: pain level controlled Vital Signs Assessment: post-procedure vital signs reviewed and stable Respiratory status: spontaneous breathing, nonlabored ventilation, respiratory function stable and patient connected to nasal cannula oxygen Cardiovascular status: blood pressure returned to baseline and stable Postop Assessment: no apparent nausea or vomiting Anesthetic complications: no    Last Vitals:  Vitals:   07/08/18 1205 07/08/18 1218  BP: 122/87 131/84  Pulse: (!) 107 84  Resp: 17 18  Temp:    SpO2: 100% 96%    Last Pain:  Vitals:   07/08/18 1218  TempSrc:   PainSc: 0-No pain                 Barnet Glasgow

## 2018-07-09 ENCOUNTER — Encounter (HOSPITAL_COMMUNITY): Payer: Self-pay | Admitting: Oral Surgery

## 2018-07-12 NOTE — Progress Notes (Signed)
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 Findings are suspicious for Malignancy. ADDENDUM: Magnification views of the retroareolar right breast were performed to evaluate calcifications seen on the patient's baseline mammogram. There are loosely grouped and scattered calcifications in the outer right breast, superior to and directly posterior to the nipple. These calcifications are rounded and smudgy in the CC projection, and many of them demonstrate layering on the 90 degree lateral magnification view, consistent with benign milk of calcium. No suspicious microcalcifications are seen in the right breast.    01/31/2018 Initial Biopsy    Diagnosis 01/31/18 1. 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 - NEGATIVE FOR INVASIVE CARCINOMA. 2. 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 - NEGATIVE FOR INVASIVE CARCINOMA.    01/31/2018 Receptors her2    Prognostic indicators significant for: ER, 100% positive and PR, 100% positive, both with strong staining intensity.     02/03/2018 Initial Diagnosis    Ductal carcinoma in situ (DCIS) of left breast    02/10/2018 Genetic Testing    Testing did not reveal a pathogenic mutation in any of the genes analyzed.A copy of the genetic test report will be scanned into Epic under the Media tab. The genes analyzed were the 23 genes on [facility]'s Breast/GYN panel (ATM, BARD1, BRCA1, BRCA2, BRIP1, CDH1, CHEK2, DICER1, EPCAM, MLH1,  MSH2, MSH6, NBN, NF1, PALB2, PMS2, PTEN, RAD50, RAD51C, RAD51D,SMARCA4, STK11, and TP53).  Genetic testing involved analysis of 9 genes: ATM, BRCA1, BRCA2, CDH1, CHEK2, PALB2, PTEN, STK11 and TP53 genes. Testing was normal and did not reveal a mutation in these genes.      03/18/2018 Surgery    TOTAL LEFT MASTECTOMY WITH AXILLARY SENTINEL LYMPH NODE BIOPSY and  LEFT BREAST RECONSTRUCTION WITH PLACEMENT OF TISSUE EXPANDER AND FLEX HD (ACELLULAR HYDRATED DERMIS) by Dr. HExcell Seltzerand Dr. [name] 03/18/18    03/18/2018 Pathology Results    Diagnosis 03/18/18 1. Lymph node, sentinel, biopsy, Left Axillary #1 - METASTATIC CARCINOMA IN ONE OF ONE LYMPH NODES (1/1). 2. Lymph node, sentinel, biopsy, Left Axillary #2 - ONE OF ONE LYMPH NODES NEGATIVE FOR CARCINOMA (0/1). 3. Breast, simple mastectomy, Left Total - MICROINVASIVE DUCTAL CARCINOMA ARISING IN A BACKGROUND OF LOW GRADE DUCTAL CARCINOMA IN SITU. - DUCTAL CARCINOMA IN SITU PARTIALLY INVOLVES AN INTRADUCTAL PAPILLOMA. - RESECTION MARGINS ARE NEGATIVE FOR INVASIVE CARCINOMA. - IN SITU CARCINOMA IS PRESENT AT THE ANTERIOR MARGIN BROADLY. - BIOPSY SITE. - SEE ONCOLOGY TABLE.     03/18/2018 Receptors her2    Estrogen Receptor: 100%, POSITIVE, MODERATE-WEAK STAINING INTENSITY Progesterone Receptor: 100%, POSITIVE, STRONG STAINING INTENSITY Proliferation  Marker Ki67: 1% HER2 - NEGATIVE    03/18/2018 Cancer Staging    Staging form: Breast, AJCC 8th Edition - Pathologic stage from 03/18/2018: Stage IA (pT64m, pN1a, cM0, G1, ER+, PR+, HER2-) -  Signed by [name] on 03/28/2018    05/18/2018 - 06/27/2018 Radiation Therapy    Adjuvant radiation by Dr. KSondra Come    06/27/2018 Mammogram    06/27/2018 Mammogram IMPRESSION: Benign appearing right breast calcifications.  Recommendation: Screening right mammogram is suggested in April 2020.   CURRENT THERAPY: pending tamoxifen  INTERVAL HISTORY: Ms. EColeereturns to discuss tamoxifen. He had recent oral surgery and is healing well. She wonders when she can begin tamoxifen after the procedure. She also wonders if she can take tumeric. She had vaginal spotting recently before dental surgery, and noticed it also before mastectomy. She has GYN with regular f/u. She continues to have mild-moderate, intermittent hot flashes. She was prescribed effexor but never took it. She is interested in gabapentin today. She had transient pain in right breast recently, no other concerns in her breasts. She feels she is recovering well from radiation. She has not scheduled screening MRI yet.    MEDICAL HISTORY:  Past Medical History:  Diagnosis Date  . Anemia   . Anxiety   . 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 Family history of breast cancer   . Family history of ovarian cancer   . Genetic testing 02/21/2018   STAT Breast panel with reflex to Breast/GYN panel (23 genes) @ [facility] - No pathogenic mutations detected  . Hypertension   . PONV (postoperative nausea and vomiting)   . Uterine fibroid     SURGICAL HISTORY: Past Surgical History:  Procedure Laterality Date  . AXILLARY SURGERY    . BREAST RECONSTRUCTION WITH PLACEMENT OF TISSUE EXPANDER AND FLEX HD (ACELLULAR HYDRATED DERMIS) Left 03/18/2018   Procedure: LEFT BREAST RECONSTRUCTION WITH PLACEMENT OF TISSUE EXPANDER AND FLEX HD (ACELLULAR HYDRATED DERMIS);  Surgeon: TIrene Limbo MD;  [facility]  Service: Plastics;  Laterality: Left;  . KNEE ARTHROSCOPY Left 2013  . MASTECTOMY W/ SENTINEL  NODE BIOPSY Left 03/18/2018   Procedure: TOTAL LEFT MASTECTOMY WITH AXILLARY SENTINEL LYMPH NODE BIOPSY;  Surgeon: HExcell Seltzer MD;  [facility]  Service: General;  Laterality: Left;  . TOOTH EXTRACTION N/A 07/08/2018   Procedure: DENTAL RESTORATION/EXTRACTIONS;  Surgeon: JDiona Browner DDS;  [facility]  Service: Oral Surgery;  Laterality: N/A;  . TUBAL LIGATION      I have reviewed the social history and family history with the patient and they are unchanged from previous note.  ALLERGIES:  has No Known Allergies.  MEDICATIONS:  Current Outpatient Medications  Medication Sig Dispense Refill  . amoxicillin (AMOXIL) 500 MG capsule Take 1 capsule (500 mg total) by mouth 3 (three) times daily. 21 capsule 0  . Aspirin-Acetaminophen-Caffeine (GOODY HEADACHE PO) Take 1 packet by mouth daily as needed (headache).     . cyclobenzaprine (FLEXERIL) 10 MG tablet Take 1 tablet (10 mg total) by mouth 3 (three) times daily as needed for muscle spasms. 30 tablet 0  . ferrous sulfate 325 (65 FE) MG tablet Take 1 tablet (325 mg total) by mouth daily with breakfast. 30 tablet 3  . lisinopril (PRINIVIL,ZESTRIL) 10 MG tablet Take 1 tablet (10 mg total) by mouth daily. 90 tablet 1  . oxyCODONE (ROXICODONE) 5 MG immediate release tablet Take 1 tablet (5 mg total) by  mouth every 4 (four) hours as needed for severe pain. 40 tablet 0  . oxyCODONE-acetaminophen (PERCOCET) 10-325 MG tablet Take 1 tablet by mouth every 6 (six) hours as needed for pain. 30 tablet 0  . tamoxifen (NOLVADEX) 20 MG tablet Take 1 tablet (20 mg total) by mouth daily. 30 tablet 2  . gabapentin (NEURONTIN) 300 MG capsule Take 1 capsule (300 mg total) by mouth at bedtime. 30 capsule 1  . venlafaxine XR (EFFEXOR-XR) 75 MG 24 hr capsule Take 1 capsule (75 mg total) by mouth daily with breakfast. 30 capsule 2   No current facility-administered medications for this visit.     PHYSICAL EXAMINATION: ECOG  PERFORMANCE STATUS: 0 - Asymptomatic  Vitals:   07/13/18 0955  BP: (!) 126/93  Pulse: 97  Resp: 16  Temp: 98.2 F (36.8 C)  SpO2: 100%   Filed Weights   07/13/18 0955  Weight: 173 lb 9.6 oz (78.7 kg)    GENERAL:alert, no distress and comfortable SKIN: no rashes or significant lesions EYES:  sclera clear OROPHARYNX:no thrush or ulcers LYMPH:  no palpable cervical or supraclavicular lymphadenopathy LUNGS: clear to auscultation with normal breathing effort HEART: regular rate & rhythm, no lower extremity edema ABDOMEN:abdomen soft, non-tender and normal bowel sounds Musculoskeletal:no cyanosis of digits and no clubbing  NEURO: alert & oriented x 3 with fluent speech, no focal motor/sensory deficits Breast exam: s/p left mastectomy and adjuvant radiation. Moderate hyperpigmentation and skin dryness without open wound. Right breast and bilateral axilla without palpable mass.   LABORATORY DATA:  I have reviewed the data as listed CBC Latest Ref Rng & Units 06/28/2018 02/10/2018 12/08/2017  WBC 3.9 - 10.3 K/uL 2.6(Baker) 3.8(Baker) 5.0  Hemoglobin 11.6 - 15.9 g/dL 11.8 12.1 8.9(Baker)  Hematocrit 34.8 - 46.6 % 36.7 39.2 31.8(Baker)  Platelets 145 - 400 K/uL 263 349 406(H)     CMP Latest Ref Rng & Units 06/28/2018 02/10/2018 12/08/2017  Glucose 70 - 99 mg/dL 106(H) 97 112(H)  BUN 6 - 20 mg/dL '8 9 6  ' Creatinine 0.44 - 1.00 mg/dL 0.88 0.84 0.87  Sodium 135 - 145 mmol/Baker 140 140 139  Potassium 3.5 - 5.1 mmol/Baker 4.1 3.9 4.3  Chloride 98 - 111 mmol/Baker 104 106 104  CO2 22 - 32 mmol/Baker '26 25 22  ' Calcium 8.9 - 10.3 mg/dL 9.5 9.9 9.1  Total Protein 6.5 - 8.1 g/dL 7.6 8.2 -  Total Bilirubin 0.3 - 1.2 mg/dL 0.6 0.6 -  Alkaline Phos 38 - 126 U/Baker 77 71 -  AST 15 - 41 U/Baker 12(Baker) 13 -  ALT 0 - 44 U/Baker 8 8 -   PATHOLOGY  Diagnosis 03/18/18 1. Lymph node, sentinel, biopsy, Left Axillary #1 - METASTATIC CARCINOMA IN ONE OF ONE LYMPH NODES (1/1). 2. Lymph node, sentinel, biopsy, Left Axillary #2 - ONE OF ONE  LYMPH NODES NEGATIVE FOR CARCINOMA (0/1). 3. Breast, simple mastectomy, Left Total - MICROINVASIVE DUCTAL CARCINOMA ARISING IN A BACKGROUND OF LOW GRADE DUCTAL CARCINOMA IN SITU. - DUCTAL CARCINOMA IN SITU PARTIALLY INVOLVES AN INTRADUCTAL PAPILLOMA. - RESECTION MARGINS ARE NEGATIVE FOR INVASIVE CARCINOMA. - IN SITU CARCINOMA IS PRESENT AT THE ANTERIOR MARGIN BROADLY. - BIOPSY SITE. - SEE ONCOLOGY TABLE. Microscopic Comment 3. INVASIVE CARCINOMA OF THE BREAST: Resection Procedure: Left simple mastectomy with left axillary sentinel lymph node biopsies. Specimen Laterality: Left. Tumor Size: <1 mm. Histologic Type: Invasive ductal carcinoma. Histologic Grade: Glandular (Acinar)/Tubular Differentiation: 1 Nuclear Pleomorphism: 1 Mitotic Rate: 1 Overall Grade: 1 Ductal Carcinoma  In Situ: Extensive, low grade. Partially involves intraductal papilloma. Tumor Extension: Confined to breast parenchyma. Margins: Distance from closest margin (millimeters): >10 mm. Specify closest margin (required only if <55m): N/A. DCIS Margins: Distance from closest margin (millimeters): Present at anterior margin broadly. Specify closest margin (required only if <148m: Anterior. Cannot be determined (explain): N/A. Regional Lymph Nodes: Number of Lymph Nodes Examined: 2 Number of Sentinel Nodes Examined (if applicable): 2 Number of Lymph Nodes with Macrometastases (>2 mm): 1 2 of 4 FINAL for Casey Baker, Casey Baker ([id number] Comment(continued) Number of Lymph Nodes with Micrometastases: 0 Number of Lymph Nodes with Isolated Tumor Cells (0.2 mm or 200 cells)#: 0 Size of Largest Metastatic Deposit (millimeters): 2 mm. Extranodal Extension: None. Treatment Effect: No known presurgical therapy Breast Biomarker Testing Performed on Previous Biopsy: Will be attempted on part #1. Testing Performed on Case Number: [id number] Receptor: Positive, 100% strong staining. Progesterone  Receptor: Positive, 100% strong staining. HER2: N/A. Ki-67: N/A. Representative tumor block: 1A, 3C (limited). Pathologic Stage Classification (pTNM, AJCC 8th Edition): [id number] Comments: Immunohistochemistry is performed on several blocks. There is focal lose of basal cell markers (calponin, smooth muscle myosin) corresponding to microinvasion (block 3C). (v4.2.0.0) 1. PROGNOSTIC INDICATORS Results: IMMUNOHISTOCHEMICAL AND MORPHOMETRIC ANALYSIS PERFORMED MANUALLY Estrogen Receptor: 100%, POSITIVE, MODERATE-WEAK STAINING INTENSITY Progesterone Receptor: 100%, POSITIVE, STRONG STAINING INTENSITY Proliferation Marker Ki67: 1% REFERENCE RANGE ESTROGEN RECEPTOR NEGATIVE 0% POSITIVE =>1% REFERENCE RANGE PROGESTERONE RECEPTOR NEGATIVE 0% POSITIVE =>1% 1. FLUORESCENCE IN-SITU HYBRIDIZATION Results: HER2 - NEGATIVE RATIO OF HER2/CEP17 SIGNALS 1.13 AVERAGE HER2 COPY NUMBER PER CELL 1.70 Reference Range: NEGATIVE HER2/CEP17 Ratio <2.0 and average HER2 copy number <4.0 EQUIVOCAL HER2/CEP17 Ratio <2.0 and average HER2 copy number >=4.0 and <6.0    Diagnosis 01/31/18 1. 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 - NEGATIVE FOR INVASIVE CARCINOMA. 2. 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 - NEGATIVE FOR INVASIVE CARCINOMA. 1 of 2 FINAL for Casey Baker, Casey [phone] Note 1. Dr. KisLyndon Codes reviewed this case and concurs with the above diagnosis. [facility] notified on 02/01/18. A breast prognostic profile is pending and will be reported in an addendum. (NK:kh 02/01/18) Results: Estrogen Receptor: 100%, POSITIVE, STRONG STAINING INTENSITY Progesterone Receptor: 100%, POSITIVE, STRONG STAINING INTENSITY    RADIOGRAPHIC STUDIES: I have personally  reviewed the radiological images as listed and agreed with the findings in the report. No results found.   ASSESSMENT & PLAN: Casey Baker [age]ar old pre-menopausal woman, presented with screening discovered to DCIS.   1.  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 HTN   3. Anemia, iron deficiency from menorrhagia   4. Hot Flashes, Mood disorder, insomnia   Mr. EatCrotherspears well. Her physical exam is consistent with continued healing from adjuvant radiation, otherwise unremarkable. She recently underwent oral surgery on 9/20. She has recovered well. Recent labs reviewed, mild leukopenia but otherwise stable. She will call breast center to schedule her screening MRI.   She had many questions about adjuvant therapy with tamoxifen, I answered to the best of my ability.  To answer her questions: she may divide dose in 10 mg BID; she can start next week  as she reports she is healing well from oral surgery. I reviewed with pharmacy, she should avoid tumeric due to potential interaction with tamoxifen in the CYP pathway. For hot flashes, she never took effexor. I prescribed gabapentin 300 mg qHS for her to try.   She will call if she experiences new or worsening side effects on treatment. Otherwise we will see her back in 3 months for physical exam and to monitor side effects on tamoxifen.   PLAN: -Begin adjuvant tamoxifen in 1 week, OK to divide in 2 doses daily  -Begin gabapentin 300 mg po qHS for hot flashes -Return in 3 months for routine f/u -Call breast center to schedule screening MRI   All questions were answered. The patient knows to call the clinic with any problems, questions or concerns. No barriers to learning was detected. I spent 20 minutes counseling the patient face to face. The total time spent in the appointment  was 25 minutes and more than 50% was on counseling and review of test results     Alla Feeling, NP 07/13/18

## 2018-07-13 ENCOUNTER — Encounter: Payer: Self-pay | Admitting: Nurse Practitioner

## 2018-07-13 ENCOUNTER — Inpatient Hospital Stay (HOSPITAL_BASED_OUTPATIENT_CLINIC_OR_DEPARTMENT_OTHER): Payer: Medicaid Other | Admitting: Nurse Practitioner

## 2018-07-13 VITALS — BP 126/93 | HR 97 | Temp 98.2°F | Resp 16 | Ht 67.0 in | Wt 173.6 lb

## 2018-07-13 DIAGNOSIS — C50412 Malignant neoplasm of upper-outer quadrant of left female breast: Secondary | ICD-10-CM

## 2018-07-13 DIAGNOSIS — D0512 Intraductal carcinoma in situ of left breast: Secondary | ICD-10-CM | POA: Diagnosis not present

## 2018-07-13 DIAGNOSIS — Z79810 Long term (current) use of selective estrogen receptor modulators (SERMs): Secondary | ICD-10-CM

## 2018-07-13 DIAGNOSIS — D72819 Decreased white blood cell count, unspecified: Secondary | ICD-10-CM

## 2018-07-13 DIAGNOSIS — Z170 Estrogen receptor positive status [ER+]: Secondary | ICD-10-CM | POA: Diagnosis not present

## 2018-07-13 DIAGNOSIS — R232 Flushing: Secondary | ICD-10-CM

## 2018-07-13 DIAGNOSIS — N951 Menopausal and female climacteric states: Secondary | ICD-10-CM | POA: Diagnosis not present

## 2018-07-13 MED ORDER — GABAPENTIN 300 MG PO CAPS: 300.0000 mg | ORAL_CAPSULE | Freq: Every day | ORAL | 1 refills | Status: DC

## 2018-07-13 NOTE — Patient Instructions (Signed)
Tamoxifen oral solution What is this medicine? TAMOXIFEN (ta MOX i fen) blocks the effects of estrogen. It is commonly used to treat breast cancer. It is also used to decrease the chance of breast cancer coming back in women who have received treatment for the disease. It may also help prevent breast cancer in women who have a high risk of developing breast cancer. This medicine may be used for other purposes; ask your health care provider or pharmacist if you have questions. COMMON BRAND NAME(S): Soltamox What should I tell my health care provider before I take this medicine? They need to know if you have any of these conditions: -blood clots -blood disease -cataracts or impaired eyesight -endometriosis -high calcium levels -high cholesterol -irregular menstrual cycles -liver disease -stroke -uterine fibroids -an unusual reaction to tamoxifen, other medicines, foods, dyes, or preservatives -pregnant or trying to get pregnant -breast-feeding How should I use this medicine? Take this medicine by mouth with a glass of water. Follow the directions on the prescription label. You can take it with or without food. Take your medicine at regular intervals. Do not take your medicine more often than directed. Do not stop taking except on your doctor's advice. A special MedGuide will be given to you by the pharmacist with each prescription and refill. Be sure to read this information carefully each time. Talk to your pediatrician regarding the use of this medicine in children. While this drug may be prescribed for selected conditions, precautions do apply. Overdosage: If you think you have taken too much of this medicine contact a poison control center or emergency room at once. NOTE: This medicine is only for you. Do not share this medicine with others. What if I miss a dose? If you miss a dose, take it as soon as you can. If it is almost time for your next dose, take only that dose. Do not take  double or extra doses. What may interact with this medicine? Do not take this medicine with any of the following medications: -cisapride -certain medicines for irregular heart beat like dofetilide, dronedarone, quinidine -certain medicines for fungal infection like fluconazole, posaconazole -pimozide -saquinavir -thioridazine This medicine may also interact with the following medications: -aminoglutethimide -anastrozole -bromocriptine -chemotherapy drugs -female hormones, like estrogens and birth control pills -letrozole -medroxyprogesterone -phenobarbital -rifampin -warfarin This list may not describe all possible interactions. Give your health care provider a list of all the medicines, herbs, non-prescription drugs, or dietary supplements you use. Also tell them if you smoke, drink alcohol, or use illegal drugs. Some items may interact with your medicine. What should I watch for while using this medicine? Visit your doctor or health care professional for regular checks on your progress. You will need regular pelvic exams, breast exams, and mammograms. If you are taking this medicine to reduce your risk of getting breast cancer, you should know that this medicine does not prevent all types of breast cancer. If breast cancer or other problems occur, there is no guarantee that it will be found at an early stage. Do not become pregnant while taking this medicine or for 2 months after stopping this medicine. Stop taking this medicine if you get pregnant or think you are pregnant and contact your doctor. This medicine may harm your unborn baby. Women who can possibly become pregnant should use birth control methods that do not use hormones during tamoxifen treatment and for 2 months after therapy has stopped. Talk with your health care provider for birth control advice.   Do not breast feed while taking this medicine. What side effects may I notice from receiving this medicine? Side effects that  you should report to your doctor or health care professional as soon as possible: -allergic reactions like skin rash, itching or hives, swelling of the face, lips, or tongue -changes in vision -changes in your menstrual cycle -difficulty walking or talking -new breast lumps -numbness -pelvic pain or pressure -redness, blistering, peeling or loosening of the skin, including inside the mouth -signs and symptoms of a dangerous change in heartbeat or heart rhythm like chest pain, dizziness, fast or irregular heartbeat, palpitations, feeling faint or lightheaded, falls, breathing problems -sudden chest pain -swelling, pain or tenderness in your calf or leg -unusual bruising or bleeding -vaginal discharge that is bloody, brown, or rust -weakness -yellowing of the whites of the eyes or skin Side effects that usually do not require medical attention (report to your doctor or health care professional if they continue or are bothersome): -fatigue -hair loss, although uncommon and is usually mild -headache -hot flashes -impotence (in men) -nausea, vomiting (mild) -vaginal discharge (white or clear) This list may not describe all possible side effects. Call your doctor for medical advice about side effects. You may report side effects to FDA at 1-800-FDA-1088. Where should I keep my medicine? Keep out of the reach of children. Store in the original package at room temperature between 20 and 25 degrees C (68 and 77 degrees F). Do not store above 25 degrees C (77 degrees F). DO NOT freeze or refrigerate. Protect from light. Keep container tightly closed. Use within 3 months of opening. Throw away any unused medicine after the expiration date. NOTE: This sheet is a summary. It may not cover all possible information. If you have questions about this medicine, talk to your doctor, pharmacist, or health care provider.  2018 Elsevier/Gold Standard (2016-04-24 07:13:59)  

## 2018-07-14 ENCOUNTER — Other Ambulatory Visit: Payer: Self-pay

## 2018-07-15 ENCOUNTER — Other Ambulatory Visit: Payer: Self-pay

## 2018-07-15 DIAGNOSIS — C50412 Malignant neoplasm of upper-outer quadrant of left female breast: Secondary | ICD-10-CM

## 2018-07-15 DIAGNOSIS — Z170 Estrogen receptor positive status [ER+]: Principal | ICD-10-CM

## 2018-07-15 NOTE — Progress Notes (Signed)
IMG 

## 2018-07-21 ENCOUNTER — Telehealth: Payer: Self-pay | Admitting: Hematology

## 2018-07-21 ENCOUNTER — Telehealth: Payer: Self-pay

## 2018-07-21 NOTE — Telephone Encounter (Signed)
I called patient back.  She has looked up the information about tamoxifen on internet, and is very scared about the potential side effects.  I again reviewed the importance of antiestrogen therapy, giving her node positive disease.  And then we discussed the most common side effects, and rare side effects also.  After lengthy discussion, she agrees to start tamoxifen in the next few days, she will start at 10 mg daily, if tolerates well, will go up to 20 mg daily after 2 weeks.  She voiced good understanding and appreciated my call.  Truitt Merle  07/21/2018

## 2018-07-21 NOTE — Telephone Encounter (Signed)
Patient calls needing to speak with Dr. Burr Medico, she has questions and some concerns.

## 2018-07-23 ENCOUNTER — Ambulatory Visit
Admission: RE | Admit: 2018-07-23 | Discharge: 2018-07-23 | Disposition: A | Discharge status: Home / Self Care | Payer: Medicaid Other | Source: Ambulatory Visit | Attending: Hematology | Admitting: Hematology

## 2018-07-23 DIAGNOSIS — C50412 Malignant neoplasm of upper-outer quadrant of left female breast: Secondary | ICD-10-CM

## 2018-07-23 DIAGNOSIS — Z170 Estrogen receptor positive status [ER+]: Principal | ICD-10-CM

## 2018-08-01 ENCOUNTER — Other Ambulatory Visit: Payer: Self-pay

## 2018-08-01 ENCOUNTER — Ambulatory Visit
Admission: RE | Admit: 2018-08-01 | Discharge: 2018-08-01 | Disposition: A | Discharge status: Home / Self Care | Payer: Medicaid Other | Source: Ambulatory Visit | Attending: Radiation Oncology | Admitting: Radiation Oncology

## 2018-08-01 ENCOUNTER — Encounter: Payer: Self-pay | Admitting: Radiation Oncology

## 2018-08-01 ENCOUNTER — Telehealth: Payer: Self-pay

## 2018-08-01 VITALS — BP 153/88 | HR 93 | Temp 98.7°F | Wt 172.2 lb

## 2018-08-01 DIAGNOSIS — Z170 Estrogen receptor positive status [ER+]: Secondary | ICD-10-CM | POA: Diagnosis not present

## 2018-08-01 DIAGNOSIS — C50412 Malignant neoplasm of upper-outer quadrant of left female breast: Secondary | ICD-10-CM

## 2018-08-01 DIAGNOSIS — Z7982 Long term (current) use of aspirin: Secondary | ICD-10-CM | POA: Insufficient documentation

## 2018-08-01 DIAGNOSIS — Z923 Personal history of irradiation: Secondary | ICD-10-CM | POA: Diagnosis not present

## 2018-08-01 DIAGNOSIS — Z79899 Other long term (current) drug therapy: Secondary | ICD-10-CM | POA: Insufficient documentation

## 2018-08-01 DIAGNOSIS — R0789 Other chest pain: Secondary | ICD-10-CM | POA: Diagnosis not present

## 2018-08-01 MED ORDER — OXYCODONE HCL 5 MG PO TABS: 5.0000 mg | ORAL_TABLET | ORAL | 0 refills | Status: DC | PRN

## 2018-08-01 NOTE — Progress Notes (Signed)
Radiation Oncology         [phone] ________________________________  Name: Casey Baker MRN: [id number]  Date: 08/01/2018  DOB: 09-03-1971  Follow-Up Visit Note  CC: Patient, No Pcp Per  Truitt Merle, MD    ICD-10-CM   1. Malignant neoplasm of upper-outer quadrant of left breast in female, estrogen receptor positive ([place]) C50.412    Z17.0     Diagnosis:   47 y.o.female withStage IA (pT63m, pN1a)LeftBreast UOQMicroinvasive Ductal Carcinoma,ER+/ PR+/ Her2-,Grade 1  Cancer Staging Malignant neoplasm of upper-outer quadrant of left female breast ([facility] Staging form: Breast, AJCC 8th Edition - Clinical stage from 02/09/2018: Stage 0 (cTis (DCIS), cN0, cM0, ER+, PR+) - Unsigned - Pathologic stage from 03/18/2018: Stage IA (pT110m pN1a, cM0, G1, ER+, PR+, HER2-) - Signed by FeTruitt MerleMD on 03/28/2018   Interval Since Last Radiation:  5 weeks  05/18/18 - 06/27/18:  1. Left Chest Wall, Supraclavicular, and Axilla/ 45 Gy in 25 fractions 2. Boost/ 50.4 Gy in 28 fractions  Narrative:  The patient returns today for routine follow-up. She reports feeling worse than she did during radiation.   She reports intermittent sharp/shooting pain to her left chest and back. She states she feels it worse when she is sleeping and the pain will wake her up. She reports moderate fatigue and being tired all the time. She states her skin is healing well. She denies swelling in her arms but notes pain.  ALLERGIES:  has No Known Allergies.  Meds: Current Outpatient Medications  Medication Sig Dispense Refill  . amoxicillin (AMOXIL) 500 MG capsule Take 1 capsule (500 mg total) by mouth 3 (three) times daily. 21 capsule 0  . Aspirin-Acetaminophen-Caffeine (GOODY HEADACHE PO) Take 1 packet by mouth daily as needed (headache).     . cyclobenzaprine (FLEXERIL) 10 MG tablet Take 1 tablet (10 mg total) by mouth 3 (three) times daily as needed for muscle spasms. 30 tablet 0  . ferrous sulfate 325 (65  FE) MG tablet Take 1 tablet (325 mg total) by mouth daily with breakfast. 30 tablet 3  . gabapentin (NEURONTIN) 300 MG capsule Take 1 capsule (300 mg total) by mouth at bedtime. 30 capsule 1  . lisinopril (PRINIVIL,ZESTRIL) 10 MG tablet Take 1 tablet (10 mg total) by mouth daily. 90 tablet 1  . oxyCODONE (ROXICODONE) 5 MG immediate release tablet Take 1 tablet (5 mg total) by mouth every 4 (four) hours as needed for severe pain. 40 tablet 0  . oxyCODONE-acetaminophen (PERCOCET) 10-325 MG tablet Take 1 tablet by mouth every 6 (six) hours as needed for pain. 30 tablet 0  . tamoxifen (NOLVADEX) 20 MG tablet Take 1 tablet (20 mg total) by mouth daily. 30 tablet 2  . venlafaxine XR (EFFEXOR-XR) 75 MG 24 hr capsule Take 1 capsule (75 mg total) by mouth daily with breakfast. 30 capsule 2   No current facility-administered medications for this encounter.     Physical Findings: The patient is in no acute distress. Patient is alert and oriented.  weight is 172 lb 3.2 oz (78.1 kg). Her oral temperature is 98.7 F (37.1 C). Her blood pressure is 153/88 (abnormal) and her pulse is 93. Her oxygen saturation is 100%.  Lungs are clear to auscultation bilaterally. Heart has regular rate and rhythm. No palpable cervical, supraclavicular, or axillary adenopathy. Abdomen soft, non-tender, normal bowel sounds. No visible or palpable abnormality noted in her upper back where she complains of pain. Right Breast: no palpable mass, nipple discharge or  bleeding. Left Chest: shows tissue expander in place, patient's skin has healed well, some hyperpigmentation changes. Patient is tender to palpation along the upper border of her tissue expander, but no visible abnormality in this area.  Lab Findings: Lab Results  Component Value Date   WBC 2.6 (L) 06/28/2018   HGB 11.8 06/28/2018   HCT 36.7 06/28/2018   MCV 82.4 06/28/2018   PLT 263 06/28/2018    Radiographic Findings: No results found.  Impression:  47  y.o.female withClinical stage0 (Tis, Nx), Pathologic Stage IA (pT60m, pN1a)LeftBreast UOQMicroinvasive Ductal Carcinoma,ER(+)/ PR(+)/ Her2 (-),Grade 1 The patient is recovering from the effects of radiation.  The patient continues to have significant pain in the left chest, which is likely related to her tissue expander. She is taking gabapentin, which is not controlling her pain. She has run out of her oxycodone. In light of her difficulty sleeping, I have refilled her oxycodone. The patient is using nonsteroidals and/or aspirin to control her pain in addition.  Plan:  Routine follow up in 3 months. Recommend she also discuss her discomfort with Dr. [name] her plastic surgeon.  -----------------------------------  JBlair Promise PhD, MD  This document serves as a record of services personally performed by JGery Pray MD. It was created on his behalf by KWilburn Mylar a trained medical scribe. The creation of this record is based on the scribe's personal observations and the provider's statements to them. This document has been checked and approved by the attending provider.

## 2018-08-01 NOTE — Progress Notes (Signed)
Pt here today for a follow-up for left breast cancer. Pt states that she has sharp shooting pain that comes and goes in her left breast and back. Pt states that she feels it worse when she is sleeping. Pt states moderate fatigue and is tired all the time. Pt states that her skin is healing well and is not using any creams or oils on her skin.  BP (!) 153/88 (BP Location: Right Arm, Patient Position: Sitting)   Pulse 93   Temp 98.7 F (37.1 C) (Oral)   Wt 172 lb 3.2 oz (78.1 kg)   SpO2 100%   BMI 26.97 kg/m    Wt Readings from Last 3 Encounters:  08/01/18 172 lb 3.2 oz (78.1 kg)  07/13/18 173 lb 9.6 oz (78.7 kg)  07/08/18 175 lb (79.4 kg)

## 2018-08-01 NOTE — Progress Notes (Signed)
A 

## 2018-08-01 NOTE — Telephone Encounter (Signed)
Spoke with patient c/o headaches she thinks is coming from Tamoxifen, having them daily, spoke with Dr. Burr Medico, instructed patient to stop Tamoxifen for one week, see if headaches go away, restart to see if they come back.  May have to switch to another medication.

## 2018-08-04 ENCOUNTER — Other Ambulatory Visit: Payer: Self-pay | Admitting: Hematology

## 2018-08-04 DIAGNOSIS — C50412 Malignant neoplasm of upper-outer quadrant of left female breast: Secondary | ICD-10-CM

## 2018-08-04 DIAGNOSIS — Z170 Estrogen receptor positive status [ER+]: Principal | ICD-10-CM

## 2018-08-19 ENCOUNTER — Ambulatory Visit
Admission: RE | Admit: 2018-08-19 | Discharge: 2018-08-19 | Disposition: A | Discharge status: Home / Self Care | Payer: Medicaid Other | Source: Ambulatory Visit | Attending: Hematology | Admitting: Hematology

## 2018-08-19 ENCOUNTER — Other Ambulatory Visit: Payer: Self-pay | Admitting: Hematology

## 2018-08-19 DIAGNOSIS — C50412 Malignant neoplasm of upper-outer quadrant of left female breast: Secondary | ICD-10-CM

## 2018-08-19 DIAGNOSIS — Z170 Estrogen receptor positive status [ER+]: Principal | ICD-10-CM

## 2018-09-08 ENCOUNTER — Telehealth (HOSPITAL_COMMUNITY): Payer: Self-pay | Admitting: *Deleted

## 2018-09-08 NOTE — Telephone Encounter (Signed)
 **  Called and spoke with patient for 3rd health coaching session.  Patient stated that she was sick and had a cold and could not talk at the moment.  Patient requested that I call her back next week.

## 2018-09-19 ENCOUNTER — Telehealth: Payer: Self-pay

## 2018-09-19 NOTE — Telephone Encounter (Signed)
Call is regarding Casey Baker's 3rd health coaching session with [place].

## 2018-09-19 NOTE — Telephone Encounter (Signed)
Call is regarding Casey Baker's 3rd health coaching session with [place].

## 2018-09-23 NOTE — Progress Notes (Signed)
[name]   Telephone:(336) [phone] Fax:(336) [phone]   Clinic Follow up Note   Patient Care Team: Patient, [name] - General (General Practice) 09/26/2018  CHIEF COMPLAINT: F/u on left breast cancer    SUMMARY OF ONCOLOGIC HISTORY: Oncology History   Cancer Staging Ductal carcinoma in situ (DCIS) of left breast Staging form: Breast, AJCC 8th Edition - Clinical stage from 02/09/2018: Stage 0 (cTis (DCIS), cN0, cM0, ER+, PR+) - Unsigned - Pathologic stage from 03/18/2018: Stage IA (pT97m, pN1a, cM0, G1, ER+, PR+, HER2-) - Signed by [name] on 03/28/2018       Malignant neoplasm of upper-outer quadrant of left female breast ([name]   01/27/2018 Mammogram    IMPRESSION: Suspicious bloody left nipple discharge with palpable masses in the 2 o'clock axis of the left breast. Findings are suspicious for Malignancy. ADDENDUM: Magnification views of the retroareolar right breast were performed to evaluate calcifications seen on the patient's baseline mammogram. There are loosely grouped and scattered calcifications in the outer right breast, superior to and directly posterior to the nipple. These calcifications are rounded and smudgy in the CC projection, and many of them demonstrate layering on the 90 degree lateral magnification view, consistent with benign milk of calcium. No suspicious microcalcifications are seen in the right breast.    01/31/2018 Initial Biopsy    Diagnosis 01/31/18 1. 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 - NEGATIVE FOR INVASIVE CARCINOMA. 2. 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 - NEGATIVE FOR INVASIVE CARCINOMA.    01/31/2018 Receptors her2    Prognostic indicators significant for: ER, 100% positive and PR, 100% positive,  both with strong staining intensity.     02/03/2018 Initial Diagnosis    Ductal carcinoma in situ (DCIS) of left breast    02/10/2018 Genetic Testing    Testing did not reveal a pathogenic mutation in any of the genes analyzed.A copy of the genetic test report will be scanned into Epic under the Media tab. The genes analyzed were the 23 genes on [facility]'s Breast/GYN panel (ATM, BARD1, BRCA1, BRCA2, BRIP1, CDH1, CHEK2, DICER1, EPCAM, MLH1,  MSH2, MSH6, NBN, NF1, PALB2, PMS2, PTEN, RAD50, RAD51C, RAD51D,SMARCA4, STK11, and TP53).  Genetic testing involved analysis of 9 genes: ATM, BRCA1, BRCA2, CDH1, CHEK2, PALB2, PTEN, STK11 and TP53 genes. Testing was normal and did not reveal a mutation in these genes.      03/18/2018 Surgery    TOTAL LEFT MASTECTOMY WITH AXILLARY SENTINEL LYMPH NODE BIOPSY and  LEFT BREAST RECONSTRUCTION WITH PLACEMENT OF TISSUE EXPANDER AND FLEX HD (ACELLULAR HYDRATED DERMIS) by Dr. HExcell Seltzerand Dr. [name] 03/18/18    03/18/2018 Pathology Results    Diagnosis 03/18/18 1. Lymph node, sentinel, biopsy, Left Axillary #1 - METASTATIC CARCINOMA IN ONE OF ONE LYMPH NODES (1/1). 2. Lymph node, sentinel, biopsy, Left Axillary #2 - ONE OF ONE LYMPH NODES NEGATIVE FOR CARCINOMA (0/1). 3. Breast, simple mastectomy, Left Total - MICROINVASIVE DUCTAL CARCINOMA ARISING IN A BACKGROUND OF LOW GRADE DUCTAL CARCINOMA IN SITU. - DUCTAL CARCINOMA IN SITU PARTIALLY INVOLVES AN INTRADUCTAL PAPILLOMA. - RESECTION MARGINS ARE NEGATIVE FOR INVASIVE CARCINOMA. - IN SITU CARCINOMA IS PRESENT AT THE ANTERIOR MARGIN BROADLY. - BIOPSY SITE. - SEE ONCOLOGY TABLE.     03/18/2018 Receptors her2    Estrogen Receptor:  100%, POSITIVE, MODERATE-WEAK STAINING INTENSITY Progesterone Receptor: 100%, POSITIVE, STRONG STAINING INTENSITY Proliferation Marker Ki67: 1% HER2 - NEGATIVE    03/18/2018 Cancer Staging    Staging form: Breast, AJCC 8th Edition - Pathologic stage from 03/18/2018: Stage IA  (pT72m, pN1a, cM0, G1, ER+, PR+, HER2-) - Signed by [name] on 03/28/2018    05/18/2018 - 06/27/2018 Radiation Therapy    Adjuvant radiation by Dr. KSondra Come    06/27/2018 Mammogram    06/27/2018 Mammogram IMPRESSION: Benign appearing right breast calcifications.  Recommendation: Screening right mammogram is suggested in April 2020.    07/19/2018 -  Anti-estrogen oral therapy    Adjuvant tamoxifen      CURRENT THERAPY:  Adjuvant Tamoxifen, started in early Oct 2019    INTERVAL HISTORY: Casey BROCATOis a 47y.o. female who is here for follow-up. Today, she is here alone. She is tolerating tamoxifen well and denies side effects. She has very mild and tolerable intermittent hot flashes. She said that she has not experienced a headache after her last call, but had one yesterday. She says that she thinks her headaches are related to blood work, not tamoxifen. Her last cycle was 2 months ago and her menses are becoming irregular. She thinks she caught a cold from her daughter last week, and feels like her chest is congested.     Pertinent positives and negatives of review of systems are listed and detailed within the above HPI.  REVIEW OF SYSTEMS:   Constitutional: Denies fevers, chills or abnormal weight loss (+) mild intermittent hot flashes (+) occasional headaches  Eyes: Denies blurriness of vision Ears, nose, mouth, throat, and face: Denies mucositis or sore throat Respiratory: Denies cough, dyspnea or wheezes Cardiovascular: Denies palpitation, chest discomfort or lower extremity swelling Gastrointestinal:  Denies nausea, heartburn or change in bowel habits Skin: Denies abnormal skin rashes Lymphatics: Denies new lymphadenopathy or easy bruising Neurological:Denies numbness, tingling or new weaknesses Behavioral/Psych: Mood is stable, no new changes  All other systems were reviewed with the patient and are negative.  MEDICAL HISTORY:  Past Medical History:  Diagnosis Date    . Anemia   . Anxiety   . 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 Family history of breast cancer   . Family history of ovarian cancer   . Genetic testing 02/21/2018   STAT Breast panel with reflex to Breast/GYN panel (23 genes) @ [facility] - No pathogenic mutations detected  . Hypertension   . PONV (postoperative nausea and vomiting)   . Uterine fibroid     SURGICAL HISTORY: Past Surgical History:  Procedure Laterality Date  . AXILLARY SURGERY    . BREAST RECONSTRUCTION WITH PLACEMENT OF TISSUE EXPANDER AND FLEX HD (ACELLULAR HYDRATED DERMIS) Left 03/18/2018   Procedure: LEFT BREAST RECONSTRUCTION WITH PLACEMENT OF TISSUE EXPANDER AND FLEX HD (ACELLULAR HYDRATED DERMIS);  Surgeon: TIrene Limbo MD;  [place] [facility]  Service: Plastics;  Laterality: Left;  . DENTAL SURGERY N/A 07/08/2018  . KNEE ARTHROSCOPY Left 2013  . MASTECTOMY W/ SENTINEL NODE BIOPSY Left 03/18/2018   Procedure: TOTAL LEFT MASTECTOMY WITH AXILLARY SENTINEL LYMPH NODE BIOPSY;  Surgeon: HExcell Seltzer MD;  [place] [facility]  Service: General;  Laterality: Left;  . TOOTH EXTRACTION N/A 07/08/2018   Procedure: DENTAL RESTORATION/EXTRACTIONS;  Surgeon: JDiona Browner DDS;  [facility]  Service: Oral Surgery;  Laterality: N/A;  . TUBAL LIGATION      I have reviewed the  social history and family history with the patient and they are unchanged from previous note.  ALLERGIES:  has No Known Allergies.  MEDICATIONS:  Current Outpatient Medications  Medication Sig Dispense Refill  . amoxicillin (AMOXIL) 500 MG capsule Take 1 capsule (500 mg total) by mouth 3 (three) times daily. 21 capsule 0  . Aspirin-Acetaminophen-Caffeine (GOODY HEADACHE PO) Take 1 packet by mouth daily as needed (headache).     . cyclobenzaprine (FLEXERIL) 10 MG tablet Take 1 tablet (10 mg total) by mouth 3 (three) times daily as needed for muscle spasms. 30 tablet 0  . ferrous  sulfate 325 (65 FE) MG tablet Take 1 tablet (325 mg total) by mouth daily with breakfast. 30 tablet 3  . gabapentin (NEURONTIN) 300 MG capsule Take 1 capsule (300 mg total) by mouth at bedtime. 30 capsule 1  . lisinopril (PRINIVIL,ZESTRIL) 10 MG tablet Take 1 tablet (10 mg total) by mouth daily. 90 tablet 1  . oxyCODONE (ROXICODONE) 5 MG immediate release tablet Take 1 tablet (5 mg total) by mouth every 4 (four) hours as needed for severe pain. 40 tablet 0  . oxyCODONE-acetaminophen (PERCOCET) 10-325 MG tablet Take 1 tablet by mouth every 6 (six) hours as needed for pain. 30 tablet 0  . tamoxifen (NOLVADEX) 20 MG tablet Take 1 tablet (20 mg total) by mouth daily. 30 tablet 2  . venlafaxine XR (EFFEXOR-XR) 75 MG 24 hr capsule Take 1 capsule (75 mg total) by mouth daily with breakfast. 30 capsule 2   No current facility-administered medications for this visit.     PHYSICAL EXAMINATION: ECOG PERFORMANCE STATUS: 0 - Asymptomatic  Vitals:   09/26/18 0843  BP: (!) 161/102  Pulse: (!) 122  Resp: 20  Temp: 98.6 F (37 C)  SpO2: 100%   Filed Weights   09/26/18 0843  Weight: 173 lb 1.6 oz (78.5 kg)    GENERAL:alert, no distress and comfortable SKIN: skin color, texture, turgor are normal, no rashes or significant lesions EYES: normal, Conjunctiva are pink and non-injected, sclera clear OROPHARYNX:no exudate, no erythema and lips, buccal mucosa, and tongue normal  NECK: supple, thyroid normal size, non-tender, without nodularity LYMPH:  no palpable lymphadenopathy in the cervical, axillary or inguinal LUNGS: clear to auscultation and percussion with normal breathing effort HEART: regular rate & rhythm and no murmurs and no lower extremity edema (+) tachycardia  ABDOMEN:abdomen soft, non-tender and normal bowel sounds Musculoskeletal:no cyanosis of digits and no clubbing  NEURO: alert & oriented x 3 with fluent speech, no focal motor/sensory deficits  LABORATORY DATA:  I have reviewed  the data as listed CBC Latest Ref Rng & Units 09/26/2018 06/28/2018 02/10/2018  WBC 4.0 - 10.5 K/uL 2.4(L) 2.6(L) 3.8(L)  Hemoglobin 12.0 - 15.0 g/dL 13.6 11.8 12.1  Hematocrit 36.0 - 46.0 % 44.0 36.7 39.2  Platelets 150 - 400 K/uL 230 263 349     CMP Latest Ref Rng & Units 09/26/2018 06/28/2018 02/10/2018  Glucose 70 - 99 mg/dL 83 106(H) 97  BUN 6 - 20 mg/dL _0 Creatinine 0.44 - 1.00 mg/dL 0.85 0.88 0.84  Sodium 135 - 145 mmol/L 144 140 140  Potassium 3.5 - 5.1 mmol/L 4.3 4.1 3.9  Chloride 98 - 111 mmol/L 107 104 106  CO2 22 - 32 mmol/L _1 Calcium 8.9 - 10.3 mg/dL 9.4 9.5 9.9  Total Protein 6.5 - 8.1 g/dL 7.6 7.6 8.2  Total Bilirubin 0.3 - 1.2 mg/dL 0.6 0.6 0.6  Alkaline Phos  38 - 126 U/L 61 77 71  AST 15 - 41 U/L 13(L) 12(L) 13  ALT 0 - 44 U/L _0 RADIOGRAPHIC STUDIES: I have personally reviewed the radiological images as listed and agreed with the findings in the report. No results found.   ASSESSMENT & PLAN:  CIRCE CHILTON is a 47 y.o. female with history of  1. 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 Treated with left total mastectomy and radiation. Currently on adjuvant Tamoxifen. Tolerating with well.  -Plan for tamoxifen 10 years.  If she becomes postmenopausal in the next few years, we will probably change to aromatase inhibitor for additional 5 years. -Labs reviewed, CBC showed WBC 2.4K Hg 13.6. CMP pending.  -She is clinically doing well, exam was unremarkable, no clinical concern for recurrence. -I discussed survivorship. She declined.  2. Anemia, iron deficiency from menorrhagia  -Labs reviewed, CBC showed WBS 2.4K and Hg 13.6. -She is not on iron pills. LMP was 2 months ago. -resolved now    3. Hot Flashes, Mood disorder, insomnia  -These symptoms were noted  before starting Tamoxifen. -She is currently on gabapentin 330m qHS. Much improved   4. HTN, poorly controlled -She is currently on Lisinopril, she restarted a few days ago after she ran out for couple months. -I advised her to continue her medications and lower her salt intake. -I also advised her to monitor her BP at home and informed her that if her BP remains high, I will add a BP medication. -I educated her about how high BP can contribute to her headaches. -BP today is 161/102.  -She denies chest discomfort   5. Chest congestion -She says that her daughter caught a cold from school and she think she caught the cold from her daughter a week ago. -She still feels like her chest is congested. -Lungs are clear on exam. Heart rate is high.  6.  Mild neutropenia  -She has developed mild leukopenia, in the past several months, previously white count was normal.  -Her white count is 2.4, with ANC 1.1 today, not sure is related to her recent URI or tamoxifen  -she knows that she is at risk of infections due to her low WBC count. I offered a flu shot, but she refused. I advised her to avoid large crowds and sick contacts, and use Turmeric to boost her immunity.  -Continue monitoring.  Plan  -continue tamoxifen -recheck BP at home  -f/u in 4 months with labs   No problem-specific Assessment & Plan notes found for this encounter.   No orders of the defined types were placed in this encounter.  All questions were answered. The patient knows to call the clinic with any problems, questions or concerns. No barriers to learning was detected. I spent 20 minutes counseling the patient face to face. The total time spent in the appointment was 25 minutes and more than 50% was on counseling and review of test results  I, Noor Dweik am acting as scribe for Dr. YTruitt Merle  I have reviewed the above documentation for accuracy and completeness, and I agree with the above.     YTruitt Merle  MD 09/26/2018'

## 2018-09-26 ENCOUNTER — Telehealth: Payer: Self-pay | Admitting: Hematology

## 2018-09-26 ENCOUNTER — Encounter: Payer: Self-pay | Admitting: Hematology

## 2018-09-26 ENCOUNTER — Inpatient Hospital Stay: Payer: Medicaid Other | Attending: Hematology | Admitting: Hematology

## 2018-09-26 ENCOUNTER — Inpatient Hospital Stay: Payer: Medicaid Other

## 2018-09-26 VITALS — BP 161/102 | HR 122 | Temp 98.6°F | Resp 20 | Ht 67.0 in | Wt 173.1 lb

## 2018-09-26 DIAGNOSIS — Z79810 Long term (current) use of selective estrogen receptor modulators (SERMs): Secondary | ICD-10-CM | POA: Insufficient documentation

## 2018-09-26 DIAGNOSIS — I10 Essential (primary) hypertension: Secondary | ICD-10-CM | POA: Diagnosis not present

## 2018-09-26 DIAGNOSIS — D500 Iron deficiency anemia secondary to blood loss (chronic): Secondary | ICD-10-CM | POA: Insufficient documentation

## 2018-09-26 DIAGNOSIS — Z79899 Other long term (current) drug therapy: Secondary | ICD-10-CM | POA: Diagnosis not present

## 2018-09-26 DIAGNOSIS — D0512 Intraductal carcinoma in situ of left breast: Secondary | ICD-10-CM | POA: Diagnosis not present

## 2018-09-26 DIAGNOSIS — R51 Headache: Secondary | ICD-10-CM | POA: Diagnosis not present

## 2018-09-26 DIAGNOSIS — C50412 Malignant neoplasm of upper-outer quadrant of left female breast: Secondary | ICD-10-CM

## 2018-09-26 DIAGNOSIS — N920 Excessive and frequent menstruation with regular cycle: Secondary | ICD-10-CM | POA: Diagnosis not present

## 2018-09-26 DIAGNOSIS — G4700 Insomnia, unspecified: Secondary | ICD-10-CM | POA: Diagnosis not present

## 2018-09-26 DIAGNOSIS — Z7982 Long term (current) use of aspirin: Secondary | ICD-10-CM | POA: Diagnosis not present

## 2018-09-26 DIAGNOSIS — F329 Major depressive disorder, single episode, unspecified: Secondary | ICD-10-CM

## 2018-09-26 DIAGNOSIS — Z170 Estrogen receptor positive status [ER+]: Secondary | ICD-10-CM | POA: Diagnosis not present

## 2018-09-26 DIAGNOSIS — Z923 Personal history of irradiation: Secondary | ICD-10-CM | POA: Insufficient documentation

## 2018-09-26 DIAGNOSIS — N951 Menopausal and female climacteric states: Secondary | ICD-10-CM | POA: Insufficient documentation

## 2018-09-26 DIAGNOSIS — F39 Unspecified mood [affective] disorder: Secondary | ICD-10-CM | POA: Diagnosis not present

## 2018-09-26 LAB — CBC WITH DIFFERENTIAL (CANCER CENTER ONLY)
Abs Immature Granulocytes: 0 10*3/uL (ref 0.00–0.07)
BASOS PCT: 1 %
Basophils Absolute: 0 10*3/uL (ref 0.0–0.1)
EOS PCT: 2 %
Eosinophils Absolute: 0 10*3/uL (ref 0.0–0.5)
HCT: 44 % (ref 36.0–46.0)
Hemoglobin: 13.6 g/dL (ref 12.0–15.0)
Immature Granulocytes: 0 %
Lymphocytes Relative: 38 %
Lymphs Abs: 0.9 10*3/uL (ref 0.7–4.0)
MCH: 26.2 pg (ref 26.0–34.0)
MCHC: 30.9 g/dL (ref 30.0–36.0)
MCV: 84.6 fL (ref 80.0–100.0)
MONO ABS: 0.4 10*3/uL (ref 0.1–1.0)
Monocytes Relative: 16 %
Neutro Abs: 1.1 10*3/uL — ABNORMAL LOW (ref 1.7–7.7)
Neutrophils Relative %: 43 %
PLATELETS: 230 10*3/uL (ref 150–400)
RBC: 5.2 MIL/uL — AB (ref 3.87–5.11)
RDW: 16.9 % — ABNORMAL HIGH (ref 11.5–15.5)
WBC: 2.4 10*3/uL — AB (ref 4.0–10.5)
nRBC: 0 % (ref 0.0–0.2)

## 2018-09-26 LAB — CMP (CANCER CENTER ONLY)
ALT: 6 U/L (ref 0–44)
AST: 13 U/L — AB (ref 15–41)
Albumin: 3.9 g/dL (ref 3.5–5.0)
Alkaline Phosphatase: 61 U/L (ref 38–126)
Anion gap: 10 (ref 5–15)
BILIRUBIN TOTAL: 0.6 mg/dL (ref 0.3–1.2)
BUN: 7 mg/dL (ref 6–20)
CALCIUM: 9.4 mg/dL (ref 8.9–10.3)
CO2: 27 mmol/L (ref 22–32)
CREATININE: 0.85 mg/dL (ref 0.44–1.00)
Chloride: 107 mmol/L (ref 98–111)
GFR, Est AFR Am: >60 mL/min (ref >60)
Glucose, Bld: 83 mg/dL (ref 70–99)
Potassium: 4.3 mmol/L (ref 3.5–5.1)
Sodium: 144 mmol/L (ref 135–145)
TOTAL PROTEIN: 7.6 g/dL (ref 6.5–8.1)

## 2018-09-26 NOTE — Telephone Encounter (Signed)
Printed and mailed calendar. °

## 2018-09-27 ENCOUNTER — Other Ambulatory Visit: Payer: Self-pay | Admitting: Radiation Oncology

## 2018-09-27 DIAGNOSIS — N951 Menopausal and female climacteric states: Secondary | ICD-10-CM

## 2018-09-27 MED ORDER — CYCLOBENZAPRINE HCL 10 MG PO TABS: 10.0000 mg | ORAL_TABLET | Freq: Three times a day (TID) | ORAL | 0 refills | Status: DC | PRN

## 2018-09-29 ENCOUNTER — Encounter (HOSPITAL_COMMUNITY): Payer: Self-pay | Admitting: Emergency Medicine

## 2018-09-29 ENCOUNTER — Ambulatory Visit (HOSPITAL_COMMUNITY)
Admission: EM | Admit: 2018-09-29 | Discharge: 2018-09-29 | Disposition: A | Discharge status: Home / Self Care | Payer: Medicaid Other | Attending: Emergency Medicine | Admitting: Emergency Medicine

## 2018-09-29 DIAGNOSIS — J0100 Acute maxillary sinusitis, unspecified: Secondary | ICD-10-CM

## 2018-09-29 MED ORDER — BENZONATATE 200 MG PO CAPS: 200.0000 mg | ORAL_CAPSULE | Freq: Three times a day (TID) | ORAL | 0 refills | Status: DC | PRN

## 2018-09-29 MED ORDER — DOXYCYCLINE HYCLATE 100 MG PO CAPS: 100.0000 mg | ORAL_CAPSULE | Freq: Two times a day (BID) | ORAL | 0 refills | Status: AC

## 2018-09-29 MED ORDER — HYDROCOD POLST-CPM POLST ER 10-8 MG/5ML PO SUER: 5.0000 mL | Freq: Two times a day (BID) | ORAL | 0 refills | Status: DC | PRN

## 2018-09-29 MED ORDER — FLUTICASONE PROPIONATE 50 MCG/ACT NA SUSP: 2.0000 | Freq: Every day | NASAL | 0 refills | Status: DC

## 2018-09-29 NOTE — ED Notes (Signed)
Patient able to ambulate independently  

## 2018-09-29 NOTE — Discharge Instructions (Addendum)
Take the medication as written.  Continue Mucinex to keep the mucous thin  You may take 600 mg of motrin with 1 gram of tylenol up to 3-4 times a day as needed for pain. This is an effective combination for pain.   Use a NeilMed sinus rinse with distilled water as often as you want to to reduce nasal congestion. Follow the directions on the box.   Go to www.goodrx.com to look up your medications. This will give you a list of where you can find your prescriptions at the most affordable prices. Or you can ask the pharmacist what the cash price is. This is frequently cheaper than going through insurance.

## 2018-09-29 NOTE — ED Provider Notes (Signed)
HPI  SUBJECTIVE:  Casey Baker is a 47 y.o. female who presents with 3 weeks of nasal congestion, postnasal drip, left maxillary sinus pain and pressure, chest congestion and a cough that is productive of yellow, green, clear "bad tasting" mucus.  No upper dental pain, facial swelling, fevers.  States that she is unable to sleep secondary to the cough.  No wheezing, chest pain, shortness of breath.  States that she was getting better, but then got worse.  No allergy symptoms.  She tried Mucinex, Sudafed, NyQuil, Benadryl, Flonase.  The Flonase and Mucinex seem to help the most.  Symptoms are worse with lying down.  No antibiotics in the past month.  No antipyretic in the past 4 to 6 hours.  She has a past medical history of sinus tachycardia, breast cancer currently on tamoxifen, hypertension.  No history of asthma, eczema, COPD, smoking, allergies, diabetes.  LMP: 2 months ago.  Status post bilateral tubal ligation.  Denies the possibility of being pregnant.  States that she has not had intercourse since having her.  PMD: None.  States that [facility] but has not established care yet.   Past Medical History:  Diagnosis Date  . Anemia   . Anxiety   . 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 Family history of breast cancer   . Family history of ovarian cancer   . Genetic testing 02/21/2018   STAT Breast panel with reflex to Breast/GYN panel (23 genes) @ [facility] - No pathogenic mutations detected  . Hypertension   . PONV (postoperative nausea and vomiting)   . Uterine fibroid     Past Surgical History:  Procedure Laterality Date  . AXILLARY SURGERY    . BREAST RECONSTRUCTION WITH PLACEMENT OF TISSUE EXPANDER AND FLEX HD (ACELLULAR HYDRATED DERMIS) Left 03/18/2018   Procedure: LEFT BREAST RECONSTRUCTION WITH PLACEMENT OF TISSUE EXPANDER AND FLEX HD (ACELLULAR HYDRATED DERMIS);  Surgeon: [name], MD;   Location: [place];  Service: Plastics;  Laterality: Left;  . DENTAL SURGERY N/A 07/08/2018  . KNEE ARTHROSCOPY Left 2013  . MASTECTOMY W/ SENTINEL NODE BIOPSY Left 03/18/2018   Procedure: TOTAL LEFT MASTECTOMY WITH AXILLARY SENTINEL LYMPH NODE BIOPSY;  Surgeon: Excell Seltzer, MD;  Location: [place];  Service: General;  Laterality: Left;  . TOOTH EXTRACTION N/A 07/08/2018   Procedure: DENTAL RESTORATION/EXTRACTIONS;  Surgeon: Diona Browner, DDS;  Location: [place];  Service: Oral Surgery;  Laterality: N/A;  . TUBAL LIGATION      Family History  Problem Relation Age of Onset  . Hypertension Mother   . Ovarian cancer Maternal Grandmother        dx 12s; deceased 43s. She declined therapy of any kind including surgery and chemo  . Breast cancer Cousin        dx 8s; currently 85s; daughter of matenral uncle  . Leukemia Maternal Aunt        currently 7    Social History   Tobacco Use  . Smoking status: Never Smoker  . Smokeless tobacco: Never Used  Substance Use Topics  . Alcohol use: No  . Drug use: No    No current facility-administered medications for this encounter.   Current Outpatient Medications:  .  benzonatate (TESSALON) 200 MG capsule, Take 1 capsule (200 mg total) by mouth 3 (three) times daily as needed for cough., Disp: 30 capsule, Rfl: 0 .  chlorpheniramine-HYDROcodone (TUSSIONEX PENNKINETIC ER) 10-8 MG/5ML SUER, Take 5 mLs by mouth every 12 (twelve) hours as needed for cough., Disp: 60 mL, Rfl: 0 .  cyclobenzaprine (FLEXERIL) 10 MG tablet, Take 1 tablet (10 mg total) by mouth 3 (three) times daily as needed for muscle spasms., Disp: 30 tablet, Rfl: 0 .  doxycycline (VIBRAMYCIN) 100 MG capsule, Take 1 capsule (100 mg total) by mouth 2 (two) times daily for 7 days., Disp: 14 capsule, Rfl: 0 .  ferrous sulfate 325 (65 FE) MG tablet, Take 1 tablet (325 mg total) by mouth daily with breakfast., Disp: 30 tablet, Rfl: 3 .  fluticasone  (FLONASE) 50 MCG/ACT nasal spray, Place 2 sprays into both nostrils daily., Disp: 16 g, Rfl: 0 .  gabapentin (NEURONTIN) 300 MG capsule, Take 1 capsule (300 mg total) by mouth at bedtime., Disp: 30 capsule, Rfl: 1 .  lisinopril (PRINIVIL,ZESTRIL) 10 MG tablet, Take 1 tablet (10 mg total) by mouth daily., Disp: 90 tablet, Rfl: 1 .  tamoxifen (NOLVADEX) 20 MG tablet, Take 1 tablet (20 mg total) by mouth daily., Disp: 30 tablet, Rfl: 2 .  venlafaxine XR (EFFEXOR-XR) 75 MG 24 hr capsule, Take 1 capsule (75 mg total) by mouth daily with breakfast., Disp: 30 capsule, Rfl: 2  No Known Allergies   ROS  As noted in HPI.   Physical Exam  BP (!) 154/87 (BP Location: Right Arm)   Pulse (!) 140   Temp 98 F (36.7 C) (Oral)   Resp 18   SpO2 100%   Constitutional: Well developed, well nourished, no acute distress Eyes:  EOMI, conjunctiva normal bilaterally HENT: Normocephalic, atraumatic,mucus membranes moist.  No nasal congestion.  Erythematous, swollen turbinates.  Positive left maxillary sinus tenderness.  No obvious postnasal drip, cobblestoning. Respiratory: Normal inspiratory effort, lungs clear bilaterally, good air movement. Cardiovascular: Regular tachycardia, no murmurs, rubs, gallops. GI: nondistended skin: No rash, skin intact Musculoskeletal: no deformities Neurologic: Alert & oriented x 3, no focal neuro deficits Psychiatric: Speech and behavior appropriate   ED Course   Medications - No data to display  No orders of the defined types were placed in this encounter.   No results found for this or any previous visit (from the past 24 hour(s)). No results found.  ED Clinical Impression  Acute non-recurrent maxillary sinusitis   ED Assessment/Plan  Heart rate noted.  It was 122 on a recent visit 3 days ago.  She states That it is always this high and that this is a normal rate for her.  She has also been taking Sudafed.  Discussed with her that she may want to stop the  Sudafed as this is likely increasing her heart rate.  [facility] narcotic database reviewed.  Patient had 6 days of OxyContin filled on August 01, 2018. low Suspicion of abuse.  Feel that it is appropriate to prescribe cough syrup.  She has a sinusitis.  Feel that antibiotics are appropriate given that she is currently immunocompromised, double sickening, and duration of symptoms.  Home with Flonase, saline nasal irrigation with a Milta Deiters med rinse and distilled water as often as she wants, doxycycline, continue Mucinex.  Tussionex for the cough at night, Tessalon for the cough during the day.  Giving patient primary care referral list for ongoing care, she states that she will try to [facility] with [facility].  discussed medical decision making, treatment plan and plan for follow-up with patient.  She agrees with plan.   Meds  ordered this encounter  Medications  . doxycycline (VIBRAMYCIN) 100 MG capsule    Sig: Take 1 capsule (100 mg total) by mouth 2 (two) times daily for 7 days.    Dispense:  14 capsule    Refill:  0  . fluticasone (FLONASE) 50 MCG/ACT nasal spray    Sig: Place 2 sprays into both nostrils daily.    Dispense:  16 g    Refill:  0  . benzonatate (TESSALON) 200 MG capsule    Sig: Take 1 capsule (200 mg total) by mouth 3 (three) times daily as needed for cough.    Dispense:  30 capsule    Refill:  0  . chlorpheniramine-HYDROcodone (TUSSIONEX PENNKINETIC ER) 10-8 MG/5ML SUER    Sig: Take 5 mLs by mouth every 12 (twelve) hours as needed for cough.    Dispense:  60 mL    Refill:  0    *This clinic note was created using [employer]. Therefore, there may be occasional mistakes despite careful proofreading.   ?    Melynda Ripple, MD 09/30/18 1420

## 2018-09-29 NOTE — ED Triage Notes (Signed)
Pt presents to [facility] for assessment of nasal congestion remaining after a cold about 2 weeks ago.  States she has continued cough and can't sleep at night.

## 2018-10-20 ENCOUNTER — Other Ambulatory Visit: Payer: Self-pay | Admitting: Nurse Practitioner

## 2018-10-20 ENCOUNTER — Other Ambulatory Visit: Payer: Self-pay | Admitting: Hematology

## 2018-10-20 DIAGNOSIS — Z170 Estrogen receptor positive status [ER+]: Principal | ICD-10-CM

## 2018-10-20 DIAGNOSIS — R232 Flushing: Secondary | ICD-10-CM

## 2018-10-20 DIAGNOSIS — C50412 Malignant neoplasm of upper-outer quadrant of left female breast: Secondary | ICD-10-CM

## 2018-11-10 ENCOUNTER — Encounter: Payer: Self-pay | Admitting: Radiation Oncology

## 2018-11-10 ENCOUNTER — Telehealth: Payer: Self-pay | Admitting: *Deleted

## 2018-11-10 ENCOUNTER — Other Ambulatory Visit: Payer: Self-pay

## 2018-11-10 ENCOUNTER — Other Ambulatory Visit: Payer: Self-pay | Admitting: Hematology

## 2018-11-10 ENCOUNTER — Ambulatory Visit
Admission: RE | Admit: 2018-11-10 | Discharge: 2018-11-10 | Disposition: A | Discharge status: Home / Self Care | Payer: Medicaid Other | Source: Ambulatory Visit | Attending: Radiation Oncology | Admitting: Radiation Oncology

## 2018-11-10 VITALS — BP 143/94 | HR 110 | Temp 98.1°F | Resp 18 | Ht 67.0 in | Wt 173.2 lb

## 2018-11-10 DIAGNOSIS — I10 Essential (primary) hypertension: Secondary | ICD-10-CM

## 2018-11-10 DIAGNOSIS — Z170 Estrogen receptor positive status [ER+]: Secondary | ICD-10-CM | POA: Insufficient documentation

## 2018-11-10 DIAGNOSIS — R232 Flushing: Secondary | ICD-10-CM | POA: Diagnosis not present

## 2018-11-10 DIAGNOSIS — C50412 Malignant neoplasm of upper-outer quadrant of left female breast: Secondary | ICD-10-CM | POA: Diagnosis not present

## 2018-11-10 DIAGNOSIS — Z79899 Other long term (current) drug therapy: Secondary | ICD-10-CM | POA: Insufficient documentation

## 2018-11-10 DIAGNOSIS — Z79810 Long term (current) use of selective estrogen receptor modulators (SERMs): Secondary | ICD-10-CM | POA: Diagnosis not present

## 2018-11-10 DIAGNOSIS — R5383 Other fatigue: Secondary | ICD-10-CM | POA: Insufficient documentation

## 2018-11-10 MED ORDER — LISINOPRIL 10 MG PO TABS: 10.0000 mg | ORAL_TABLET | Freq: Every day | ORAL | 1 refills | Status: DC

## 2018-11-10 NOTE — Progress Notes (Signed)
Radiation Oncology         [phone] ________________________________  Name: Casey Baker MRN: [id number]  Date: 11/10/2018  DOB: 1970/12/18  Follow-Up Visit Note  CC: Patient, No Pcp Per  Truitt Merle, MD    ICD-10-CM   1. Malignant neoplasm of upper-outer quadrant of left breast in female, estrogen receptor positive ([place]) C50.412 Ambulatory referral to Physical Therapy   Z17.0     Diagnosis:   48 y.o. female with Stage IA (pT65m, pN1a)LeftBreast UOQMicroinvasive Ductal Carcinoma,ER+/ PR+/ Her2-,Grade1 Cancer Staging Malignant neoplasm of upper-outer quadrant of left female breast ([facility] Staging form: Breast, AJCC 8th Edition - Clinical stage from 02/09/2018: Stage 0 (cTis (DCIS), cN0, cM0, ER+, PR+) - Unsigned - Pathologic stage from 03/18/2018: Stage IA (pT124m pN1a, cM0, G1, ER+, PR+, HER2-) - Signed by FeTruitt MerleMD on 03/28/2018  Interval Since Last Radiation:  4 months  Radiation treatment dates:   05/18/2018-06/27/2018 Site/dose:   1. Left CW, SCV, and Axilla / 1.8 Gy x 25 fractions for a total dose of 45 Gy 2. Left CW (expander in place) boost / 1.8 Gy x 3 fractions for a total dose of 50.4 Gy  Narrative:  The patient returns today for routine follow-up. Since her last visit, she presented to the ED a month ago for sinusitis and was placed on antibiotics. She states that these symptoms have resolved. She continues to have fatigue but is unsure if it is lingering from radiation or from Tamoxifen. She reports her only side effect of Tamoxifen is occasional hot flashes. She reports tenderness in the left axilla. She is no longer on Percocet and is not doing any exercises to improve mobility of her left shoulder/arm. She reports darkening of her skin in the treated site. She is applying vitamin E oil to her skin. She last saw Dr. ThIran Planasast week.  Of note, the patient underwent diagnostic mammogram and ultrasound of the right breast/axilla on 08/19/18 which did not show  any evidence of malignancy.         ALLERGIES:  has No Known Allergies.  Meds: Current Outpatient Medications  Medication Sig Dispense Refill  . cyclobenzaprine (FLEXERIL) 10 MG tablet Take 1 tablet (10 mg total) by mouth 3 (three) times daily as needed for muscle spasms. 30 tablet 0  . gabapentin (NEURONTIN) 300 MG capsule TAKE 1 CAPSULE(300 MG) BY MOUTH AT BEDTIME 30 capsule 1  . lisinopril (PRINIVIL,ZESTRIL) 10 MG tablet Take 1 tablet (10 mg total) by mouth daily. 90 tablet 1  . tamoxifen (NOLVADEX) 20 MG tablet TAKE 1 TABLET(20 MG) BY MOUTH DAILY 30 tablet 2  . benzonatate (TESSALON) 200 MG capsule Take 1 capsule (200 mg total) by mouth 3 (three) times daily as needed for cough. (Patient not taking: Reported on 11/10/2018) 30 capsule 0  . chlorpheniramine-HYDROcodone (TUSSIONEX PENNKINETIC ER) 10-8 MG/5ML SUER Take 5 mLs by mouth every 12 (twelve) hours as needed for cough. (Patient not taking: Reported on 11/10/2018) 60 mL 0  . ferrous sulfate 325 (65 FE) MG tablet Take 1 tablet (325 mg total) by mouth daily with breakfast. (Patient not taking: Reported on 11/10/2018) 30 tablet 3  . fluticasone (FLONASE) 50 MCG/ACT nasal spray Place 2 sprays into both nostrils daily. (Patient not taking: Reported on 11/10/2018) 16 g 0  . venlafaxine XR (EFFEXOR-XR) 75 MG 24 hr capsule Take 1 capsule (75 mg total) by mouth daily with breakfast. (Patient not taking: Reported on 11/10/2018) 30 capsule 2   No current facility-administered  medications for this encounter.     Physical Findings: The patient is in no acute distress. Patient is alert and oriented.  height is '5\' 7"'  (1.702 m) and weight is 173 lb 4 oz (78.6 kg). Her oral temperature is 98.1 F (36.7 C). Her blood pressure is 143/94 (abnormal) and her pulse is 110 (abnormal). Her respiration is 18 and oxygen saturation is 100%.   Lungs are clear to auscultation bilaterally. Heart has regular rate and rhythm. No palpable cervical, supraclavicular, or  axillary adenopathy. Abdomen soft, non-tender, normal bowel sounds.  Right Breast: Large and pendulous without mass or nipple discharge or bleeding. Left Chest: Hyperpigmentation changes with tissue expander in place. No palpable or visible signs of recurrence. Overall the skin and soft tissues appear to be less tense.  The patient does have some limitations of her left arm mobility related to her surgery and radiation therapy.   Lab Findings: Lab Results  Component Value Date   WBC 2.4 (L) 09/26/2018   HGB 13.6 09/26/2018   HCT 44.0 09/26/2018   MCV 84.6 09/26/2018   PLT 230 09/26/2018    Radiographic Findings: No results found.  Impression:  Stage IA (pT42m, pN1a)LeftBreast UOQMicroinvasive Ductal Carcinoma,ER+/ PR+/ Her2-,Grade1. No evidence of recurrence on clinical exam. Patient continues to have discomfort along the chest related to her reconstructed breast and radiation. She has not been consistent with her arm exercises and manual massage. Would recommend that she proceed with formal physical therapy evaluation to address these issues.  Plan:  Physical therapy referral. PRN follow-up in radiation oncology. Patient will continue follow-up with medical oncology and will be on adjuvant hormonal therapy for approximately 10 years. She continues to follow up closely with Dr. TIran Planasand later this year will likely have additional plastic surgery once she has recovered from her radiation treatment.  ____________________________________  JBlair Promise PhD, MD  This document serves as a record of services personally performed by JGery Pray MD. It was created on his behalf by HRae Lips a trained medical scribe. The creation of this record is based on the scribe's personal observations and the provider's statements to them. This document has been checked and approved by the attending provider.

## 2018-11-10 NOTE — Progress Notes (Signed)
Pt presents today for f/u with Dr. Sondra Come. Pt is unaccompanied. Pt continues to have fatigue but is unsure if it is lingering from radiation or from Tamoxifen. Pt reports tenderness in axilla. Pt is just now using vitamin E oil. Breast is hyperpigmented.   BP (!) 143/94 (BP Location: Left Arm, Patient Position: Sitting)   Pulse (!) 110   Temp 98.1 F (36.7 C) (Oral)   Resp 18   Ht 5\' 7"  (1.702 m)   Wt 173 lb 4 oz (78.6 kg)   SpO2 100%   BMI 27.13 kg/m   Wt Readings from Last 3 Encounters:  11/10/18 173 lb 4 oz (78.6 kg)  09/26/18 173 lb 1.6 oz (78.5 kg)  08/01/18 172 lb 3.2 oz (78.1 kg)   [name], RN BSN

## 2018-11-10 NOTE — Telephone Encounter (Signed)
Called patient to inform of PT appt. For 11-11-18 - arrival time - 8:30 am @ [facility], spoke with patient and she is aware of this appt. and is good with this appt.

## 2018-11-11 ENCOUNTER — Ambulatory Visit: Payer: Medicaid Other | Attending: Radiation Oncology | Admitting: Physical Therapy

## 2018-11-11 ENCOUNTER — Ambulatory Visit: Payer: Medicaid Other | Admitting: Physical Therapy

## 2018-11-21 ENCOUNTER — Telehealth: Payer: Self-pay | Admitting: Hematology

## 2018-11-21 NOTE — Telephone Encounter (Signed)
Called regarding cancellation will reschedule with Lacie upon return

## 2018-12-28 ENCOUNTER — Encounter (HOSPITAL_BASED_OUTPATIENT_CLINIC_OR_DEPARTMENT_OTHER): Payer: Self-pay | Admitting: *Deleted

## 2018-12-28 ENCOUNTER — Other Ambulatory Visit: Payer: Self-pay

## 2018-12-28 NOTE — H&P (Signed)
Subjective:     Patient ID: Casey Baker is a 48 y.o. female.  HPI  9.5 months post op. Completed RT 9.9.19. Scheduled for implant exchange and right breast reduction next week. Here with additional questions.   Presented with palpable mass and some bloody nipple discharge in January 2019.Obtained her first MMG revealed calcifications in the left breast and suspicious bloody discharge from the nipple with palpable masses in 2 o'clock position. Spans appr 8 cm over UOQ by [place]. Biopsies labeled left breast 2 o'clock, 6 cfn with low grade DCIS with intraductal papilloma, and left breast  2 o'clock, 10 cfn showedlow grade DCIS, ER/PR +. Final pathology  Microinvasive ductal ca with low grade DCIS, margins clear of invasive ca, anterior margin broadly positive for DCIS, 1/2 SLN+.  Mammaprint insufficient tissue to complete. Dr. Burr Medico discussed possible small benefit of chemo - patient declined. On tamoxifen.  Genetics negative. MMG 08/2018 9 mm oval asymmetry in the posterior aspect of the upper right breast noted, similar in appearance to an area evaluated on 01/27/2018 and felt to represent normal breast tissue on additional images. Previously described benign anterior right breast milk of calcium is unchanged. US showed a normal appearing island of dense glandular tissue in the far posterior aspect of the right breast in the 10 o'clock position, 8 cmfn, corresponding to the MMG asymmetry.  Prior DDD. Left mastectomy 1104 g    Objective:   Physical Exam  Cardiovascular: Normal rate, regular rhythm and normal heart sounds.  Pulmonary/Chest: Effort normal and breath sounds normal.  Abdominal: Soft.  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 Reviewed increased risks reconstruction following radiation including wound healing problems, capsular contracture.In prepectoral space, encouraging papers regarding radiation and outcome. However we have discussedoption latissimus flap for leftside for coverage. This would be buried with no visible back skin. Reviewed incisions, drains recovery related with LD flap.Discussed function of LD muscle and overall well tolerated but possible to experience changes in strength and rotation UE.Alternative is DIEP. If she elects for implant exchange alone on left, flap would always be plan for salvage reconstruction if problems incurred; this could include needed to start process expansion over again, would be flat chested for months. Counseled that LD flap with implant exchange would be most conservative approach. Reviewed out of my own prepectoral patients many have done well with implant placement alone but not all and cannot predict this for her.  Following discuss, plan removal left TE and placement implant, right breast reduction, fat grafting to left chest. Reviewed my recommendation to wait approximately6 months from end radiation to next surgery.  Plan right breast reduction- reviewed anchor type scars, drain. Reviewed asymmetry unilateral implant reconstruction with opposite breast- implant reconstruction will always be asymmetric with right and goal would be symmetric volume and cleavage in bra.  Patient declines latissimus flap, plan implant exchange alone  Discussed saline vs silicone. Silicone may offer options for less visible rippling however requires MRI or [place] surveillance for rupture. Both share rupture CC infection requiring removal  rippling, implants are not permanent devices and may require additional surgery. Plan  smooth round silicone capacity filled. Reviewed size will be larger than in her expander but smaller than right breast current volume.  Discussed fat grafting purpose to thicken mastectomy flap limit visible rippling. Reviewed donor site pain, need for compression, variable take graft, fat necrosis that presents as lumps. Plan lipofilling left chest from flanks as donor site.  Additional risks seroma hematoma nipple areola necrosis right, fat necrosis, DVT/PE, damage to deeper structures,need for additional procedures, unacceptable cosmetic result, cardiopulmonary complications reviewed.  Rx for oxycodone, Bactrim, Robaxin given.  Natrelle [id number]-13-T 500 tissue expander placed,  fill volume 495 ml saline.  [name], MD [facility] [phone], pin [phone]

## 2018-12-30 ENCOUNTER — Other Ambulatory Visit: Payer: Self-pay | Admitting: Hematology

## 2018-12-30 NOTE — Progress Notes (Signed)
Ensure pre surgical drink given to patient with instructions to finish by 0845 DOS. hibiclens soap was given to pt with instructions. Pt verbalized understanding

## 2019-01-02 ENCOUNTER — Encounter: Payer: Medicaid Other | Admitting: Adult Health

## 2019-01-02 ENCOUNTER — Other Ambulatory Visit: Payer: Self-pay | Admitting: Hematology

## 2019-01-02 MED ORDER — ZOLPIDEM TARTRATE 5 MG PO TABS: 5.0000 mg | ORAL_TABLET | Freq: Every evening | ORAL | 0 refills | Status: DC | PRN

## 2019-01-19 ENCOUNTER — Other Ambulatory Visit: Payer: Self-pay | Admitting: Hematology

## 2019-01-20 ENCOUNTER — Other Ambulatory Visit: Payer: Self-pay | Admitting: Hematology

## 2019-01-20 MED ORDER — ZOLPIDEM TARTRATE 5 MG PO TABS: 5.0000 mg | ORAL_TABLET | Freq: Every evening | ORAL | 0 refills | Status: DC | PRN

## 2019-01-26 ENCOUNTER — Other Ambulatory Visit: Payer: Medicaid Other

## 2019-01-26 ENCOUNTER — Ambulatory Visit: Payer: Medicaid Other | Admitting: Hematology

## 2019-02-16 NOTE — Progress Notes (Signed)
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 Findings are suspicious for Malignancy. ADDENDUM: Magnification views of the retroareolar right breast were performed to evaluate calcifications seen on the patient's baseline mammogram. There are loosely grouped and scattered calcifications in the outer right breast, superior to and directly posterior to the nipple. These calcifications are rounded and smudgy in the CC projection, and many of them demonstrate layering on the 90 degree lateral magnification view, consistent with benign milk of calcium. No suspicious microcalcifications are seen in the right breast.    01/31/2018 Initial Biopsy    Diagnosis 01/31/18 1. 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 - NEGATIVE FOR INVASIVE CARCINOMA. 2. 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 - NEGATIVE FOR INVASIVE CARCINOMA.    01/31/2018 Receptors her2    Prognostic indicators significant for: ER, 100% positive and PR,  100% positive, both with strong staining intensity.     02/03/2018 Initial Diagnosis    Ductal carcinoma in situ (DCIS) of left breast    02/10/2018 Genetic Testing    Testing did not reveal a pathogenic mutation in any of the genes analyzed.A copy of the genetic test report will be scanned into Epic under the Media tab. The genes analyzed were the 23 genes on [facility]'s Breast/GYN panel (ATM, BARD1, BRCA1, BRCA2, BRIP1, CDH1, CHEK2, DICER1, EPCAM, MLH1,  MSH2, MSH6, NBN, NF1, PALB2, PMS2, PTEN, RAD50, RAD51C, RAD51D,SMARCA4, STK11, and TP53).  Genetic testing involved analysis of 9 genes: ATM, BRCA1, BRCA2, CDH1, CHEK2, PALB2, PTEN, STK11 and TP53 genes. Testing was normal and did not reveal a mutation in these genes.      03/18/2018 Surgery    TOTAL LEFT MASTECTOMY WITH AXILLARY SENTINEL LYMPH NODE BIOPSY and  LEFT BREAST RECONSTRUCTION WITH PLACEMENT OF TISSUE EXPANDER AND FLEX HD (ACELLULAR HYDRATED DERMIS) by Dr. HExcell Seltzerand Dr. [name] 03/18/18    03/18/2018 Pathology Results    Diagnosis 03/18/18 1. Lymph node, sentinel, biopsy, Left Axillary #1 - METASTATIC CARCINOMA IN ONE OF ONE LYMPH NODES (1/1). 2. Lymph node, sentinel, biopsy, Left Axillary #2 - ONE OF ONE LYMPH NODES NEGATIVE FOR CARCINOMA (0/1). 3. Breast, simple mastectomy, Left Total - MICROINVASIVE DUCTAL CARCINOMA ARISING IN A BACKGROUND OF LOW GRADE DUCTAL CARCINOMA IN SITU. - DUCTAL CARCINOMA IN SITU PARTIALLY INVOLVES AN INTRADUCTAL PAPILLOMA. - RESECTION MARGINS ARE NEGATIVE FOR INVASIVE CARCINOMA. - IN SITU CARCINOMA IS PRESENT AT THE ANTERIOR MARGIN BROADLY. - BIOPSY SITE. - SEE ONCOLOGY TABLE.     03/18/2018 Receptors her2  Estrogen Receptor: 100%, POSITIVE, MODERATE-WEAK STAINING INTENSITY Progesterone Receptor: 100%, POSITIVE, STRONG STAINING INTENSITY Proliferation Marker Ki67: 1% HER2 - NEGATIVE    03/18/2018 Cancer Staging    Staging form: Breast, AJCC 8th Edition - Pathologic stage from  03/18/2018: Stage IA (pT19m, pN1a, cM0, G1, ER+, PR+, HER2-) - Signed by [name] on 03/28/2018    05/18/2018 - 06/27/2018 Radiation Therapy    Adjuvant radiation by Dr. KSondra Come    06/27/2018 Mammogram    06/27/2018 Mammogram IMPRESSION: Benign appearing right breast calcifications.  Recommendation: Screening right mammogram is suggested in April 2020.    07/19/2018 -  Anti-estrogen oral therapy    Adjuvant tamoxifen       CURRENT THERAPY:  Adjuvant Tamoxifen, started in early Oct 2019   INTERVAL HISTORY:  Casey GASHIis here for a follow up of left breast cancer. She presents to the clinic today by herself. She notes with Tamoxifen she has hot flashes and anxiety.  She has tried CBD oil, melatonin to relax her. She notes she had tried Gabapentin and Effexor but did not work. She has tried Ambien and trazodone for sleep, but at most gets 4 hours of sleep.  She notes she will do fine until she goes home. She denies thoughts of suicide or self harm. Her breast cancer and COVID-19 she is thinking out it most of the time.  She notes she has a support system with her daughters and her friend but when alone it is harder on herself. She notes she also as vaginal dryness. She denies vaginal discharge.  She notes she works from home and will go [place] [employer] [place] needed.     REVIEW OF SYSTEMS:   Constitutional: Denies fevers, chills or abnormal weight loss (+) hot flashes (+) trouble sleeping  Eyes: Denies blurriness of vision Ears, nose, mouth, throat, and face: Denies mucositis or sore throat Respiratory: Denies cough, dyspnea or wheezes Cardiovascular: Denies palpitation, chest discomfort or lower extremity swelling UA: (+) Vaginal dryness Gastrointestinal:  Denies nausea, heartburn or change in bowel habits Skin: Denies abnormal skin rashes Lymphatics: Denies new lymphadenopathy or easy bruising Neurological:Denies numbness, tingling or new weaknesses  Behavioral/Psych: Mood is stable, no new changes (+) depression and anxiety  All other systems were reviewed with the patient and are negative.  MEDICAL HISTORY:  Past Medical History:  Diagnosis Date  . Anemia   . Anxiety   . 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 Family history of breast cancer   . Family history of ovarian cancer   . Genetic testing 02/21/2018   STAT Breast panel with reflex to Breast/GYN panel (23 genes) @ [facility] - No pathogenic mutations detected  . Hypertension   . PONV (postoperative nausea and vomiting)   . Uterine fibroid     SURGICAL HISTORY: Past Surgical History:  Procedure Laterality Date  . AXILLARY SURGERY    . BREAST RECONSTRUCTION WITH PLACEMENT OF TISSUE EXPANDER AND FLEX HD (ACELLULAR HYDRATED DERMIS) Left 03/18/2018   Procedure: LEFT BREAST RECONSTRUCTION WITH PLACEMENT OF TISSUE EXPANDER AND FLEX HD (ACELLULAR HYDRATED DERMIS);  Surgeon: TIrene Limbo MD;  Location: [facility]  Service: Plastics;  Laterality: Left;  . DENTAL SURGERY N/A 07/08/2018  . KNEE ARTHROSCOPY Left 2013  . MASTECTOMY W/ SENTINEL NODE BIOPSY Left 03/18/2018   Procedure: TOTAL LEFT MASTECTOMY WITH AXILLARY SENTINEL LYMPH NODE BIOPSY;  Surgeon: HExcell Seltzer MD;  Location: [facility];  Service: General;  Laterality: Left;  . TOOTH EXTRACTION N/A 07/08/2018   Procedure: DENTAL RESTORATION/EXTRACTIONS;  Surgeon: Diona Browner, DDS;  Location: [facility];  Service: Oral Surgery;  Laterality: N/A;  . TUBAL LIGATION      I have reviewed the social history and family history with the patient and they are unchanged from previous note.  ALLERGIES:  has No Known Allergies.  MEDICATIONS:  Current Outpatient Medications  Medication Sig Dispense Refill  . gabapentin (NEURONTIN) 300 MG capsule TAKE 1 CAPSULE(300 MG) BY MOUTH AT BEDTIME 30 capsule 1  . lisinopril (PRINIVIL,ZESTRIL) 10 MG tablet Take 1 tablet (10 mg total) by  mouth daily. 90 tablet 1  . tamoxifen (NOLVADEX) 20 MG tablet TAKE 1 TABLET(20 MG) BY MOUTH DAILY 30 tablet 2  . zolpidem (AMBIEN) 5 MG tablet Take 1 tablet (5 mg total) by mouth at bedtime as needed for sleep. 15 tablet 0  . ALPRAZolam (XANAX) 0.5 MG tablet Take 1 tablet (0.5 mg total) by mouth at bedtime as needed for anxiety. 30 tablet 0  . cyclobenzaprine (FLEXERIL) 10 MG tablet Take 1 tablet (10 mg total) by mouth 3 (three) times daily as needed for muscle spasms. (Patient not taking: Reported on 02/17/2019) 30 tablet 0   No current facility-administered medications for this visit.     PHYSICAL EXAMINATION: ECOG PERFORMANCE STATUS: 1 - Symptomatic but completely ambulatory  Vitals:   02/17/19 0952  BP: (!) 152/108   Filed Weights   02/17/19 0952  Weight: 175 lb 8 oz (79.6 kg)    GENERAL:alert, no distress and comfortable SKIN: skin color, texture, turgor are normal, no rashes or significant lesions EYES: normal, Conjunctiva are pink and non-injected, sclera clear OROPHARYNX:no exudate, no erythema and lips, buccal mucosa, and tongue normal  NECK: supple, thyroid normal size, non-tender, without nodularity LYMPH:  no palpable lymphadenopathy in the cervical, axillary or inguinal LUNGS: clear to auscultation and percussion with normal breathing effort HEART: regular rate & rhythm and no murmurs and no lower extremity edema ABDOMEN:abdomen soft, non-tender and normal bowel sounds Musculoskeletal:no cyanosis of digits and no clubbing  NEURO: alert & oriented x 3 with fluent speech, no focal motor/sensory deficits BREAST: S/p left mastectomy: (+) Surgical incision healed well, no palpable breast mass or adenopathy  LABORATORY DATA:  I have reviewed the data as listed CBC Latest Ref Rng & Units 02/17/2019 09/26/2018 06/28/2018  WBC 4.0 - 10.5 K/uL 2.8(L) 2.4(L) 2.6(L)  Hemoglobin 12.0 - 15.0 g/dL 13.5 13.6 11.8  Hematocrit 36.0 - 46.0 % 41.8 44.0 36.7  Platelets 150 - 400 K/uL 220  230 263     CMP Latest Ref Rng & Units 02/17/2019 09/26/2018 06/28/2018  Glucose 70 - 99 mg/dL 91 83 106(H)  BUN 6 - 20 mg/dL '7 7 8  ' Creatinine 0.44 - 1.00 mg/dL 0.80 0.85 0.88  Sodium 135 - 145 mmol/L 143 144 140  Potassium 3.5 - 5.1 mmol/L 4.1 4.3 4.1  Chloride 98 - 111 mmol/L 108 107 104  CO2 22 - 32 mmol/L '25 27 26  ' Calcium 8.9 - 10.3 mg/dL 8.7(L) 9.4 9.5  Total Protein 6.5 - 8.1 g/dL 7.1 7.6 7.6  Total Bilirubin 0.3 - 1.2 mg/dL 0.5 0.6 0.6  Alkaline Phos 38 - 126 U/L 55 61 77  AST 15 - 41 U/L 13(L) 13(L) 12(L)  ALT 0 - 44 U/L '8 6 8      ' RADIOGRAPHIC STUDIES: I have personally reviewed the radiological images as listed and agreed  with the findings in the report. No results found.   ASSESSMENT & PLAN:  Casey Baker is a 48 y.o. female with   1. 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 Treated with left total mastectomy and radiation. Currently on adjuvant Tamoxifen. Tolerating with well.  -Plan for tamoxifen 10 years. If she becomes postmenopausal in the next few years, we will probably change to aromatase inhibitor for additional 5 years. -From a breast cancer standpoint she is doing clinically well. Labs reviewed, CBC and CMP WNL except WBC 2.8, ANC 1.2, Ca 8.7. Her neutropenia can be related to Tamoxifen as well. I encouraged her to take OTC calcium. There is no clinical concern for recurrence.  -She is considering having a hysterectomy and BSO due to her fibroids. I discussed if she proceeds we can discuss other antiestrogen options like AI.  continue Tamoxifen -I encouraged her to continue COVID-19 precautions and social distancing.  -She is still interested in Survivorship clinic. Will proceed with in 2 month, f/u with me in 5 months.   2. Hot Flashes, depression and anxiety, insomnia  -These symptoms were noted before starting Tamoxifen. -She has tried gabapentin, Melatonin, and Ambien but without much relief.  -Her concerns with COVID-19 pandemic have exacerbated her anxiety  -I previously called in Effexor 37.5 mg daily for her, she tried 2 weeks and stopped due to lack of benefit. I encouraged her try Effexor again, and discussed that it may take 1-2 month to be effective. She is agreeable. She will take 70m for 3-4 weeks and if not enough we can increase dose.  -I will call in Xanax 0.543mto held her sleep at night. She can start at half tablet and increase to full tablet if needed. I will start her with #30 tablets. She still has Gabapentin which she takes as needed.  -I offered her the chance to speak with our counselor or someone. She has declined for now.  She denies suicidal ideas -She has vaginal dryness, no discharge. I advised her not to use estrogen containing creams as this is contraindicated with her Tamoxifen. I recommend vaginal lubrications as needed.  -I will give her the suicide prevention hot line number, she knows to call if needed   3. HTN, poorly controlled -She is currently on Lisinopril -I advised her to monitor her BP at home and informed her that if her BP remains high, she needs to f/u with PCP -BP is 152/108 today (02/17/19)  4. Mild neutropenia  -She has developed mild leukopenia, in the past several months, previously white count was normal.  -I reviewed COVID-19 precautions.   -WBC 2.8, ANC 1.2 today (02/17/19) -Continue monitoring.  Plan  -I called in Xanax. She will rstart Effexor 37.60m24mgain, will increase dose if needed in 3-4 weeks  -Continue Tamoxifen  -Survivorship clinic in 2 months -Lab and f/u in 5 months    No problem-specific Assessment & Plan notes found for this encounter.   No orders of the defined types were placed in this encounter.  All questions were answered. The patient knows to call the clinic with any  problems, questions or concerns. No barriers to learning was detected. I spent 25 minutes counseling the patient face to face. The total time spent in the appointment was 30 minutes and more than 50% was on counseling and review  of test results     Truitt Merle, MD 02/17/2019   I, Joslyn Devon, am acting as scribe for Truitt Merle, MD.   I have reviewed the above documentation for accuracy and completeness, and I agree with the above.

## 2019-02-17 ENCOUNTER — Inpatient Hospital Stay (HOSPITAL_BASED_OUTPATIENT_CLINIC_OR_DEPARTMENT_OTHER): Payer: Medicaid Other | Admitting: Hematology

## 2019-02-17 ENCOUNTER — Encounter: Payer: Self-pay | Admitting: Hematology

## 2019-02-17 ENCOUNTER — Telehealth: Payer: Self-pay

## 2019-02-17 ENCOUNTER — Other Ambulatory Visit: Payer: Self-pay

## 2019-02-17 ENCOUNTER — Inpatient Hospital Stay: Payer: Medicaid Other | Attending: Hematology

## 2019-02-17 ENCOUNTER — Telehealth: Payer: Self-pay | Admitting: Hematology

## 2019-02-17 ENCOUNTER — Other Ambulatory Visit: Payer: Self-pay | Admitting: Hematology

## 2019-02-17 VITALS — BP 152/108 | Ht 67.0 in | Wt 175.5 lb

## 2019-02-17 DIAGNOSIS — C50412 Malignant neoplasm of upper-outer quadrant of left female breast: Secondary | ICD-10-CM | POA: Insufficient documentation

## 2019-02-17 DIAGNOSIS — Z170 Estrogen receptor positive status [ER+]: Secondary | ICD-10-CM

## 2019-02-17 DIAGNOSIS — Z79899 Other long term (current) drug therapy: Secondary | ICD-10-CM | POA: Diagnosis not present

## 2019-02-17 DIAGNOSIS — Z923 Personal history of irradiation: Secondary | ICD-10-CM | POA: Insufficient documentation

## 2019-02-17 DIAGNOSIS — F419 Anxiety disorder, unspecified: Secondary | ICD-10-CM | POA: Diagnosis not present

## 2019-02-17 DIAGNOSIS — Z9012 Acquired absence of left breast and nipple: Secondary | ICD-10-CM

## 2019-02-17 DIAGNOSIS — D72819 Decreased white blood cell count, unspecified: Secondary | ICD-10-CM | POA: Insufficient documentation

## 2019-02-17 DIAGNOSIS — Z79810 Long term (current) use of selective estrogen receptor modulators (SERMs): Secondary | ICD-10-CM

## 2019-02-17 HISTORY — PX: REDUCTION MAMMAPLASTY: SUR839

## 2019-02-17 LAB — CMP (CANCER CENTER ONLY)
ALT: 8 U/L (ref 0–44)
AST: 13 U/L — ABNORMAL LOW (ref 15–41)
Albumin: 3.6 g/dL (ref 3.5–5.0)
Alkaline Phosphatase: 55 U/L (ref 38–126)
Anion gap: 10 (ref 5–15)
BUN: 7 mg/dL (ref 6–20)
CO2: 25 mmol/L (ref 22–32)
Calcium: 8.7 mg/dL — ABNORMAL LOW (ref 8.9–10.3)
Chloride: 108 mmol/L (ref 98–111)
Creatinine: 0.8 mg/dL (ref 0.44–1.00)
GFR, Est AFR Am: >60 mL/min (ref >60)
GFR, Estimated: >60 mL/min (ref >60)
Glucose, Bld: 91 mg/dL (ref 70–99)
Potassium: 4.1 mmol/L (ref 3.5–5.1)
Sodium: 143 mmol/L (ref 135–145)
Total Bilirubin: 0.5 mg/dL (ref 0.3–1.2)
Total Protein: 7.1 g/dL (ref 6.5–8.1)

## 2019-02-17 LAB — CBC WITH DIFFERENTIAL (CANCER CENTER ONLY)
Abs Immature Granulocytes: 0 10*3/uL (ref 0.00–0.07)
Basophils Absolute: 0 10*3/uL (ref 0.0–0.1)
Basophils Relative: 1 %
Eosinophils Absolute: 0 10*3/uL (ref 0.0–0.5)
Eosinophils Relative: 1 %
HCT: 41.8 % (ref 36.0–46.0)
Hemoglobin: 13.5 g/dL (ref 12.0–15.0)
Immature Granulocytes: 0 %
Lymphocytes Relative: 40 %
Lymphs Abs: 1.1 10*3/uL (ref 0.7–4.0)
MCH: 28.5 pg (ref 26.0–34.0)
MCHC: 32.3 g/dL (ref 30.0–36.0)
MCV: 88.2 fL (ref 80.0–100.0)
Monocytes Absolute: 0.4 10*3/uL (ref 0.1–1.0)
Monocytes Relative: 13 %
Neutro Abs: 1.2 10*3/uL — ABNORMAL LOW (ref 1.7–7.7)
Neutrophils Relative %: 45 %
Platelet Count: 220 10*3/uL (ref 150–400)
RBC: 4.74 MIL/uL (ref 3.87–5.11)
RDW: 14.6 % (ref 11.5–15.5)
WBC Count: 2.8 10*3/uL — ABNORMAL LOW (ref 4.0–10.5)
nRBC: 0 % (ref 0.0–0.2)

## 2019-02-17 MED ORDER — ALPRAZOLAM 0.5 MG PO TABS: 0.5000 mg | ORAL_TABLET | Freq: Every evening | ORAL | 0 refills | Status: DC | PRN

## 2019-02-17 NOTE — Telephone Encounter (Signed)
Spoke with patient gave her phone number for [facility] [phone]

## 2019-02-17 NOTE — Telephone Encounter (Signed)
Scheduled appt per 5/1 los ° °A calendar will be mailed out. °

## 2019-02-23 ENCOUNTER — Other Ambulatory Visit: Payer: Self-pay | Admitting: Nurse Practitioner

## 2019-02-23 ENCOUNTER — Other Ambulatory Visit: Payer: Self-pay | Admitting: Hematology

## 2019-02-23 MED ORDER — ZOLPIDEM TARTRATE 5 MG PO TABS: 5.0000 mg | ORAL_TABLET | Freq: Every evening | ORAL | 0 refills | Status: DC | PRN

## 2019-02-23 NOTE — Telephone Encounter (Signed)
Pt. Requesting refill  Thanks, [employer]

## 2019-02-24 ENCOUNTER — Ambulatory Visit (HOSPITAL_BASED_OUTPATIENT_CLINIC_OR_DEPARTMENT_OTHER): Admit: 2019-02-24 | Payer: Medicaid Other | Admitting: Plastic Surgery

## 2019-02-24 SURGERY — REMOVAL, TISSUE EXPANDER, BREAST, WITH IMPLANT INSERTION
Anesthesia: General | Site: Chest | Laterality: Right

## 2019-02-28 ENCOUNTER — Encounter (HOSPITAL_BASED_OUTPATIENT_CLINIC_OR_DEPARTMENT_OTHER): Payer: Self-pay | Admitting: *Deleted

## 2019-02-28 ENCOUNTER — Other Ambulatory Visit: Payer: Self-pay

## 2019-03-03 ENCOUNTER — Other Ambulatory Visit (HOSPITAL_COMMUNITY)
Admission: RE | Admit: 2019-03-03 | Discharge: 2019-03-03 | Disposition: A | Discharge status: Home / Self Care | Payer: Medicaid Other | Source: Ambulatory Visit | Attending: Plastic Surgery | Admitting: Plastic Surgery

## 2019-03-03 DIAGNOSIS — Z1159 Encounter for screening for other viral diseases: Secondary | ICD-10-CM | POA: Diagnosis present

## 2019-03-04 LAB — NOVEL CORONAVIRUS, NAA (HOSP ORDER, SEND-OUT TO REF LAB; TAT 18-24 HRS): SARS-CoV-2, NAA: NOT DETECTED

## 2019-03-07 ENCOUNTER — Ambulatory Visit (HOSPITAL_BASED_OUTPATIENT_CLINIC_OR_DEPARTMENT_OTHER)
Admission: RE | Admit: 2019-03-07 | Discharge: 2019-03-07 | Disposition: A | Discharge status: Home / Self Care | Payer: Medicaid Other | Attending: Plastic Surgery | Admitting: Plastic Surgery

## 2019-03-07 ENCOUNTER — Ambulatory Visit (HOSPITAL_BASED_OUTPATIENT_CLINIC_OR_DEPARTMENT_OTHER): Payer: Medicaid Other | Admitting: Anesthesiology

## 2019-03-07 ENCOUNTER — Encounter (HOSPITAL_BASED_OUTPATIENT_CLINIC_OR_DEPARTMENT_OTHER)
Admission: RE | Disposition: A | Discharge status: Home / Self Care | Payer: Self-pay | Source: Home / Self Care | Attending: Plastic Surgery

## 2019-03-07 ENCOUNTER — Encounter (HOSPITAL_BASED_OUTPATIENT_CLINIC_OR_DEPARTMENT_OTHER): Payer: Self-pay | Admitting: Anesthesiology

## 2019-03-07 DIAGNOSIS — Z853 Personal history of malignant neoplasm of breast: Secondary | ICD-10-CM | POA: Diagnosis present

## 2019-03-07 DIAGNOSIS — Z79899 Other long term (current) drug therapy: Secondary | ICD-10-CM | POA: Diagnosis not present

## 2019-03-07 DIAGNOSIS — Z421 Encounter for breast reconstruction following mastectomy: Secondary | ICD-10-CM | POA: Diagnosis not present

## 2019-03-07 DIAGNOSIS — N6041 Mammary duct ectasia of right breast: Secondary | ICD-10-CM | POA: Insufficient documentation

## 2019-03-07 DIAGNOSIS — Z9012 Acquired absence of left breast and nipple: Secondary | ICD-10-CM | POA: Diagnosis not present

## 2019-03-07 DIAGNOSIS — Z1159 Encounter for screening for other viral diseases: Secondary | ICD-10-CM | POA: Insufficient documentation

## 2019-03-07 DIAGNOSIS — Z923 Personal history of irradiation: Secondary | ICD-10-CM | POA: Diagnosis not present

## 2019-03-07 DIAGNOSIS — N6081 Other benign mammary dysplasias of right breast: Secondary | ICD-10-CM | POA: Insufficient documentation

## 2019-03-07 DIAGNOSIS — N62 Hypertrophy of breast: Secondary | ICD-10-CM | POA: Insufficient documentation

## 2019-03-07 HISTORY — PX: MASTOPEXY: SHX5358

## 2019-03-07 HISTORY — PX: REMOVAL OF TISSUE EXPANDER AND PLACEMENT OF IMPLANT: SHX6457

## 2019-03-07 HISTORY — PX: LIPOSUCTION WITH LIPOFILLING: SHX6436

## 2019-03-07 SURGERY — REMOVAL, TISSUE EXPANDER, BREAST, WITH IMPLANT INSERTION
Anesthesia: General | Site: Breast | Laterality: Right

## 2019-03-07 MED ORDER — ROCURONIUM BROMIDE 10 MG/ML (PF) SYRINGE
PREFILLED_SYRINGE | INTRAVENOUS | Status: AC
  Filled 2019-03-07: qty 10

## 2019-03-07 MED ORDER — ONDANSETRON 4 MG PO TBDP
ORAL_TABLET | ORAL | Status: AC
  Filled 2019-03-07: qty 1

## 2019-03-07 MED ORDER — METOCLOPRAMIDE HCL 5 MG/ML IJ SOLN: 10.0000 mg | Freq: Once | INTRAMUSCULAR | Status: DC | PRN

## 2019-03-07 MED ORDER — SUGAMMADEX SODIUM 200 MG/2ML IV SOLN
INTRAVENOUS | Status: DC | PRN
  Administered 2019-03-07: 200 mg via INTRAVENOUS

## 2019-03-07 MED ORDER — LACTATED RINGERS IV SOLN: INTRAVENOUS | Status: DC

## 2019-03-07 MED ORDER — ONDANSETRON HCL 4 MG/2ML IJ SOLN
INTRAMUSCULAR | Status: AC
  Filled 2019-03-07: qty 2

## 2019-03-07 MED ORDER — SODIUM CHLORIDE 0.9 % IV SOLN
INTRAVENOUS | Status: DC | PRN
  Administered 2019-03-07: 1000 mL

## 2019-03-07 MED ORDER — LIDOCAINE 2% (20 MG/ML) 5 ML SYRINGE
INTRAMUSCULAR | Status: AC
  Filled 2019-03-07: qty 5

## 2019-03-07 MED ORDER — GABAPENTIN 300 MG PO CAPS
ORAL_CAPSULE | ORAL | Status: AC
  Filled 2019-03-07: qty 1

## 2019-03-07 MED ORDER — FENTANYL CITRATE (PF) 100 MCG/2ML IJ SOLN
25.0000 ug | INTRAMUSCULAR | Status: DC | PRN
  Administered 2019-03-07 (×2): 50 ug via INTRAVENOUS

## 2019-03-07 MED ORDER — PHENYLEPHRINE HCL (PRESSORS) 10 MG/ML IV SOLN
INTRAVENOUS | Status: DC | PRN
  Administered 2019-03-07: 120 ug via INTRAVENOUS
  Administered 2019-03-07: 40 ug via INTRAVENOUS
  Administered 2019-03-07 (×2): 80 ug via INTRAVENOUS

## 2019-03-07 MED ORDER — CHLORHEXIDINE GLUCONATE CLOTH 2 % EX PADS: 6.0000 | MEDICATED_PAD | Freq: Once | CUTANEOUS | Status: DC

## 2019-03-07 MED ORDER — OXYCODONE HCL 5 MG PO TABS
ORAL_TABLET | ORAL | Status: AC
  Filled 2019-03-07: qty 1

## 2019-03-07 MED ORDER — MIDAZOLAM HCL 2 MG/2ML IJ SOLN
INTRAMUSCULAR | Status: AC
  Filled 2019-03-07: qty 2

## 2019-03-07 MED ORDER — ACETAMINOPHEN 500 MG PO TABS
1000.0000 mg | ORAL_TABLET | ORAL | Status: AC
  Administered 2019-03-07: 1000 mg via ORAL

## 2019-03-07 MED ORDER — FENTANYL CITRATE (PF) 100 MCG/2ML IJ SOLN
INTRAMUSCULAR | Status: AC
  Filled 2019-03-07: qty 2

## 2019-03-07 MED ORDER — GABAPENTIN 300 MG PO CAPS
300.0000 mg | ORAL_CAPSULE | ORAL | Status: AC
  Administered 2019-03-07: 300 mg via ORAL

## 2019-03-07 MED ORDER — LACTATED RINGERS IV SOLN
INTRAVENOUS | Status: DC
  Administered 2019-03-07 (×2): via INTRAVENOUS

## 2019-03-07 MED ORDER — MIDAZOLAM HCL 2 MG/2ML IJ SOLN
1.0000 mg | INTRAMUSCULAR | Status: DC | PRN
  Administered 2019-03-07: 2 mg via INTRAVENOUS

## 2019-03-07 MED ORDER — SCOPOLAMINE 1 MG/3DAYS TD PT72
MEDICATED_PATCH | TRANSDERMAL | Status: AC
  Filled 2019-03-07: qty 1

## 2019-03-07 MED ORDER — LIDOCAINE HCL (CARDIAC) PF 100 MG/5ML IV SOSY
PREFILLED_SYRINGE | INTRAVENOUS | Status: DC | PRN
  Administered 2019-03-07: 100 mg via INTRAVENOUS

## 2019-03-07 MED ORDER — CYCLOBENZAPRINE HCL 10 MG PO TABS: 10.0000 mg | ORAL_TABLET | Freq: Three times a day (TID) | ORAL | 0 refills | Status: DC | PRN

## 2019-03-07 MED ORDER — SODIUM BICARBONATE 4 % IV SOLN
INTRAVENOUS | Status: DC | PRN
  Administered 2019-03-07: 200 mL via INTRAMUSCULAR

## 2019-03-07 MED ORDER — ONDANSETRON 4 MG PO TBDP
4.0000 mg | ORAL_TABLET | Freq: Once | ORAL | Status: AC
  Administered 2019-03-07: 4 mg via ORAL

## 2019-03-07 MED ORDER — CELECOXIB 200 MG PO CAPS
200.0000 mg | ORAL_CAPSULE | ORAL | Status: AC
  Administered 2019-03-07: 09:00:00 200 mg via ORAL

## 2019-03-07 MED ORDER — ONDANSETRON HCL 4 MG/2ML IJ SOLN
INTRAMUSCULAR | Status: DC | PRN
  Administered 2019-03-07: 4 mg via INTRAVENOUS

## 2019-03-07 MED ORDER — DEXAMETHASONE SODIUM PHOSPHATE 4 MG/ML IJ SOLN
INTRAMUSCULAR | Status: DC | PRN
  Administered 2019-03-07: 10 mg via INTRAVENOUS

## 2019-03-07 MED ORDER — FENTANYL CITRATE (PF) 100 MCG/2ML IJ SOLN
50.0000 ug | INTRAMUSCULAR | Status: AC | PRN
  Administered 2019-03-07: 50 ug via INTRAVENOUS
  Administered 2019-03-07: 100 ug via INTRAVENOUS
  Administered 2019-03-07: 50 ug via INTRAVENOUS

## 2019-03-07 MED ORDER — SCOPOLAMINE 1 MG/3DAYS TD PT72
1.0000 | MEDICATED_PATCH | Freq: Once | TRANSDERMAL | Status: DC | PRN
  Administered 2019-03-07: 10:00:00 1.5 mg via TRANSDERMAL

## 2019-03-07 MED ORDER — ROCURONIUM BROMIDE 100 MG/10ML IV SOLN
INTRAVENOUS | Status: DC | PRN
  Administered 2019-03-07: 60 mg via INTRAVENOUS

## 2019-03-07 MED ORDER — CELECOXIB 200 MG PO CAPS
ORAL_CAPSULE | ORAL | Status: AC
  Filled 2019-03-07: qty 1

## 2019-03-07 MED ORDER — BUPIVACAINE-EPINEPHRINE 0.25% -1:200000 IJ SOLN
INTRAMUSCULAR | Status: DC | PRN
  Administered 2019-03-07: 20 mL

## 2019-03-07 MED ORDER — CEFAZOLIN SODIUM-DEXTROSE 2-4 GM/100ML-% IV SOLN
INTRAVENOUS | Status: AC
  Filled 2019-03-07: qty 100

## 2019-03-07 MED ORDER — OXYCODONE HCL 5 MG PO TABS: 5.0000 mg | ORAL_TABLET | ORAL | 0 refills | Status: DC | PRN

## 2019-03-07 MED ORDER — OXYCODONE HCL 5 MG PO TABS: 5.0000 mg | ORAL_TABLET | Freq: Once | ORAL | Status: DC | PRN

## 2019-03-07 MED ORDER — ACETAMINOPHEN 500 MG PO TABS
ORAL_TABLET | ORAL | Status: AC
  Filled 2019-03-07: qty 2

## 2019-03-07 MED ORDER — SULFAMETHOXAZOLE-TRIMETHOPRIM 800-160 MG PO TABS: 1.0000 | ORAL_TABLET | Freq: Two times a day (BID) | ORAL | 0 refills | Status: DC

## 2019-03-07 MED ORDER — CEFAZOLIN SODIUM-DEXTROSE 2-4 GM/100ML-% IV SOLN
2.0000 g | INTRAVENOUS | Status: AC
  Administered 2019-03-07: 2 g via INTRAVENOUS

## 2019-03-07 MED ORDER — BUPIVACAINE-EPINEPHRINE (PF) 0.25% -1:200000 IJ SOLN
INTRAMUSCULAR | Status: AC
  Filled 2019-03-07: qty 30

## 2019-03-07 MED ORDER — DEXAMETHASONE SODIUM PHOSPHATE 10 MG/ML IJ SOLN
INTRAMUSCULAR | Status: AC
  Filled 2019-03-07: qty 1

## 2019-03-07 MED ORDER — METOCLOPRAMIDE HCL 5 MG/ML IJ SOLN
INTRAMUSCULAR | Status: AC
  Filled 2019-03-07: qty 2

## 2019-03-07 MED ORDER — MEPERIDINE HCL 25 MG/ML IJ SOLN: 6.2500 mg | INTRAMUSCULAR | Status: DC | PRN

## 2019-03-07 MED ORDER — PROPOFOL 10 MG/ML IV BOLUS
INTRAVENOUS | Status: DC | PRN
  Administered 2019-03-07: 200 mg via INTRAVENOUS

## 2019-03-07 SURGICAL SUPPLY — 93 items
BAG DECANTER FOR FLEXI CONT (MISCELLANEOUS) ×4 IMPLANT
BINDER ABDOMINAL 10 UNV 27-48 (MISCELLANEOUS) IMPLANT
BINDER ABDOMINAL 12 SM 30-45 (SOFTGOODS) ×2 IMPLANT
BINDER BREAST LRG (GAUZE/BANDAGES/DRESSINGS) IMPLANT
BINDER BREAST MEDIUM (GAUZE/BANDAGES/DRESSINGS) IMPLANT
BINDER BREAST XLRG (GAUZE/BANDAGES/DRESSINGS) IMPLANT
BINDER BREAST XXLRG (GAUZE/BANDAGES/DRESSINGS) ×2 IMPLANT
BLADE SURG 10 STRL SS (BLADE) ×6 IMPLANT
BLADE SURG 11 STRL SS (BLADE) ×4 IMPLANT
BLADE SURG 15 STRL LF DISP TIS (BLADE) IMPLANT
BLADE SURG 15 STRL SS (BLADE)
BNDG GAUZE ELAST 4 BULKY (GAUZE/BANDAGES/DRESSINGS) ×8 IMPLANT
CANISTER LIPO FAT HARVEST (MISCELLANEOUS) IMPLANT
CANISTER SUCT 1200ML W/VALVE (MISCELLANEOUS) ×4 IMPLANT
CHLORAPREP W/TINT 26 (MISCELLANEOUS) ×8 IMPLANT
CLOSURE WOUND 1/2 X4 (GAUZE/BANDAGES/DRESSINGS)
COVER BACK TABLE REUSABLE LG (DRAPES) ×4 IMPLANT
COVER MAYO STAND REUSABLE (DRAPES) ×8 IMPLANT
COVER WAND RF STERILE (DRAPES) IMPLANT
DECANTER SPIKE VIAL GLASS SM (MISCELLANEOUS) IMPLANT
DERMABOND ADVANCED (GAUZE/BANDAGES/DRESSINGS) ×6
DERMABOND ADVANCED .7 DNX12 (GAUZE/BANDAGES/DRESSINGS) ×4 IMPLANT
DRAIN CHANNEL 15F RND FF W/TCR (WOUND CARE) ×2 IMPLANT
DRAIN CHANNEL 19F RND (DRAIN) IMPLANT
DRAPE TOP ARMCOVERS (MISCELLANEOUS) ×4 IMPLANT
DRAPE U-SHAPE 76X120 STRL (DRAPES) ×4 IMPLANT
DRSG PAD ABDOMINAL 8X10 ST (GAUZE/BANDAGES/DRESSINGS) ×8 IMPLANT
DRSG TEGADERM 2-3/8X2-3/4 SM (GAUZE/BANDAGES/DRESSINGS) IMPLANT
ELECT BLADE 4.0 EZ CLEAN MEGAD (MISCELLANEOUS) ×4
ELECT COATED BLADE 2.86 ST (ELECTRODE) ×4 IMPLANT
ELECT REM PT RETURN 9FT ADLT (ELECTROSURGICAL) ×4
ELECTRODE BLDE 4.0 EZ CLN MEGD (MISCELLANEOUS) ×2 IMPLANT
ELECTRODE REM PT RTRN 9FT ADLT (ELECTROSURGICAL) ×2 IMPLANT
EVACUATOR SILICONE 100CC (DRAIN) ×2 IMPLANT
EXTRACTOR CANIST REVOLVE STRL (CANNISTER) IMPLANT
GAUZE SPONGE 4X4 12PLY STRL (GAUZE/BANDAGES/DRESSINGS) IMPLANT
GAUZE SPONGE 4X4 12PLY STRL LF (GAUZE/BANDAGES/DRESSINGS) IMPLANT
GLOVE BIO SURGEON STRL SZ 6 (GLOVE) ×10 IMPLANT
GOWN STRL REUS W/ TWL LRG LVL3 (GOWN DISPOSABLE) ×4 IMPLANT
GOWN STRL REUS W/TWL LRG LVL3 (GOWN DISPOSABLE) ×8
IMPL BREAST P6.4XRND XFULL 615 (Breast) IMPLANT
IMPL BRST P6.4XRND XFULL 615CC (Breast) ×2 IMPLANT
IMPLANT BREAST GEL 615CC (Breast) ×2 IMPLANT
IV NS 500ML (IV SOLUTION)
IV NS 500ML BAXH (IV SOLUTION) ×2 IMPLANT
KIT FILL SYSTEM UNIVERSAL (SET/KITS/TRAYS/PACK) IMPLANT
LINER CANISTER 1000CC FLEX (MISCELLANEOUS) ×4 IMPLANT
MARKER SKIN DUAL TIP RULER LAB (MISCELLANEOUS) IMPLANT
NDL FILTER BLUNT 18X1 1/2 (NEEDLE) IMPLANT
NDL HYPO 25X1 1.5 SAFETY (NEEDLE) IMPLANT
NDL SAFETY ECLIPSE 18X1.5 (NEEDLE) ×2 IMPLANT
NEEDLE FILTER BLUNT 18X 1/2SAF (NEEDLE)
NEEDLE FILTER BLUNT 18X1 1/2 (NEEDLE) IMPLANT
NEEDLE HYPO 18GX1.5 SHARP (NEEDLE) ×2
NEEDLE HYPO 25X1 1.5 SAFETY (NEEDLE) ×4 IMPLANT
NS IRRIG 1000ML POUR BTL (IV SOLUTION) ×4 IMPLANT
PACK BASIN DAY SURGERY FS (CUSTOM PROCEDURE TRAY) ×4 IMPLANT
PAD ALCOHOL SWAB (MISCELLANEOUS) ×4 IMPLANT
PENCIL BUTTON HOLSTER BLD 10FT (ELECTRODE) ×4 IMPLANT
PIN SAFETY STERILE (MISCELLANEOUS) ×2 IMPLANT
SHEET MEDIUM DRAPE 40X70 STRL (DRAPES) ×6 IMPLANT
SIZER BREAST REUSE GEL 580CC (SIZER) ×4
SIZER BREAST REUSE XFP 615CC (SIZER) ×4
SIZER BRST REUSE GEL 580CC (SIZER) IMPLANT
SIZER BRST REUSE XFP 615CC (SIZER) IMPLANT
SLEEVE SCD COMPRESS KNEE MED (MISCELLANEOUS) ×4 IMPLANT
SPONGE LAP 18X18 RF (DISPOSABLE) ×10 IMPLANT
STAPLER VISISTAT 35W (STAPLE) ×4 IMPLANT
STRIP CLOSURE SKIN 1/2X4 (GAUZE/BANDAGES/DRESSINGS) IMPLANT
SUT ETHILON 2 0 FS 18 (SUTURE) ×2 IMPLANT
SUT MNCRL AB 4-0 PS2 18 (SUTURE) ×8 IMPLANT
SUT PDS AB 2-0 CT2 27 (SUTURE) IMPLANT
SUT SILK 2 0 SH (SUTURE) IMPLANT
SUT VIC AB 3-0 PS1 18 (SUTURE) ×6
SUT VIC AB 3-0 PS1 18XBRD (SUTURE) ×2 IMPLANT
SUT VIC AB 3-0 SH 27 (SUTURE) ×2
SUT VIC AB 3-0 SH 27X BRD (SUTURE) ×2 IMPLANT
SUT VICRYL 4-0 PS2 18IN ABS (SUTURE) ×6 IMPLANT
SYR 10ML LL (SYRINGE) ×12 IMPLANT
SYR 20CC LL (SYRINGE) ×2 IMPLANT
SYR 50ML LL SCALE MARK (SYRINGE) ×4 IMPLANT
SYR BULB IRRIGATION 50ML (SYRINGE) ×8 IMPLANT
SYR CONTROL 10ML LL (SYRINGE) ×2 IMPLANT
SYR TB 1ML LL NO SAFETY (SYRINGE) ×4 IMPLANT
SYRINGE TOOMEY DISP (SYRINGE) IMPLANT
TAPE MEASURE VINYL STERILE (MISCELLANEOUS) ×4 IMPLANT
TOWEL GREEN STERILE FF (TOWEL DISPOSABLE) ×8 IMPLANT
TUBE CONNECTING 20'X1/4 (TUBING) ×3
TUBE CONNECTING 20X1/4 (TUBING) ×7 IMPLANT
TUBING INFILTRATION IT-10001 (TUBING) ×4 IMPLANT
TUBING SET GRADUATE ASPIR 12FT (MISCELLANEOUS) ×4 IMPLANT
UNDERPAD 30X30 (UNDERPADS AND DIAPERS) ×8 IMPLANT
YANKAUER SUCT BULB TIP NO VENT (SUCTIONS) ×6 IMPLANT

## 2019-03-07 NOTE — Discharge Instructions (Signed)
Post Anesthesia Home Care Instructions  NO tylenol or ibuprofen until after 330pm 03/07/19.  Activity: Get plenty of rest for the remainder of the day. A responsible individual must stay with you for 24 hours following the procedure.  For the next 24 hours, DO NOT: -Drive a car -Paediatric nurse -Drink alcoholic beverages -Take any medication unless instructed by your physician -Make any legal decisions or sign important papers.  Meals: Start with liquid foods such as gelatin or soup. Progress to regular foods as tolerated. Avoid greasy, spicy, heavy foods. If nausea and/or vomiting occur, drink only clear liquids until the nausea and/or vomiting subsides. Call your physician if vomiting continues.  Special Instructions/Symptoms: Your throat may feel dry or sore from the anesthesia or the breathing tube placed in your throat during surgery. If this causes discomfort, gargle with warm salt water. The discomfort should disappear within 24 hours.  If you had a scopolamine patch placed behind your ear for the management of post- operative nausea and/or vomiting:  1. The medication in the patch is effective for 72 hours, after which it should be removed.  Wrap patch in a tissue and discard in the trash. Wash hands thoroughly with soap and water. 2. You may remove the patch earlier than 72 hours if you experience unpleasant side effects which may include dry mouth, dizziness or visual disturbances. 3. Avoid touching the patch. Wash your hands with soap and water after contact with the patch.     About my Jackson-Pratt Bulb Drain  What is a Jackson-Pratt bulb? A Jackson-Pratt is a soft, round device used to collect drainage. It is connected to a long, thin drainage catheter, which is held in place by one or two small stiches near your surgical incision site. When the bulb is squeezed, it forms a vacuum, forcing the drainage to empty into the bulb.  Emptying the Jackson-Pratt bulb- To  empty the bulb: 1. Release the plug on the top of the bulb. 2. Pour the bulb's contents into a measuring container which your nurse will provide. 3. Record the time emptied and amount of drainage. Empty the drain(s) as often as your     doctor or nurse recommends.  Date                  Time                    Amount (Drain 1)                 Amount (Drain 2)  _____________________________________________________________________  _____________________________________________________________________  _____________________________________________________________________  _____________________________________________________________________  _____________________________________________________________________  _____________________________________________________________________  _____________________________________________________________________  _____________________________________________________________________  Squeezing the Jackson-Pratt Bulb- To squeeze the bulb: 1. Make sure the plug at the top of the bulb is open. 2. Squeeze the bulb tightly in your fist. You will hear air squeezing from the bulb. 3. Replace the plug while the bulb is squeezed. 4. Use a safety pin to attach the bulb to your clothing. This will keep the catheter from     pulling at the bulb insertion site.  When to call your doctor- Call your doctor if:  Drain site becomes red, swollen or hot.  You have a fever greater than 101 degrees F.  There is oozing at the drain site.  Drain falls out (apply a guaze bandage over the drain hole and secure it with tape).  Drainage increases daily not related to activity patterns. (You will usually have more drainage when you are  active than when you are resting.)  Drainage has a bad odor.

## 2019-03-07 NOTE — H&P (Signed)
Subjective:   Patient ID: Casey Baker is a 48 y.o. female.  HPI  11.5 months post op. Completed RT 9.9.19. Scheduled for implant exchange and right breast reduction next week- this was delayed secondary to [place]. Notes soreness over left superior extent expander.  Presented with palpable mass and some bloody nipple discharge in January 2019. Obtained her first MMG revealed calcifications in the left breast and suspicious bloody discharge from the nipple with palpable masses in 2 o'clock position. Spans appr 8 cm over UOQ by [place]. Biopsies labeled left breast 2 o'clock, 6 cfn with low grade DCIS with intraductal papilloma, and left breast 2 o'clock, 10 cfn showed low grade DCIS, ER/PR +. Final pathology Microinvasive ductal ca with low grade DCIS, margins clear of invasive ca, anterior margin broadly positive for DCIS, 1/2 SLN+.  Mammaprint insufficient tissue to complete. Dr. Burr Medico discussed possible small benefit of chemo - patient declined. On tamoxifen.  Genetics negative. MMG 08/2018 9 mm oval asymmetry in the posterior aspect of the upper right breast noted, similar in appearance to an area evaluated on 01/27/2018 and felt to represent normal breast tissue on additional images. Previously described benign anterior right breast milk of calcium is unchanged. US showed a normal appearing island of dense glandular tissue in the far posterior aspect of the right breast in the 10 o'clock position, 8 cmfn, corresponding to the MMG asymmetry.  Prior DDD. Left mastectomy 1104 g   Objective:  Physical Exam  Cardiovascular: Normal rate, regular rhythm and normal heart sounds.  Pulmonary/Chest: Effort normal and breath sounds normal.  Abdominal: Soft.  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  Reviewed increased risks reconstruction following radiation including wound healing problems, capsular contracture. In prepectoral space, encouraging papers regarding radiation and outcome. However we have discussed option latissimus flap for left side for coverage. This would be buried with no visible back skin. Reviewed incisions, drains recovery related with LD flap. Discussed function of LD muscle and overall well tolerated but possible to experience changes in strength and rotation UE. Alternative is DIEP. If she elects for implant exchange alone on left, flap would always be plan for salvage reconstruction if problems incurred; this could include needed to start process expansion over again, would be flat chested for months. Patient declines latissimus flap, plan implant exchange alone  Plan right breast reduction- reviewed anchor type scars, drain. Reviewed asymmetry unilateral implant reconstruction with opposite breast- implant reconstruction will always be asymmetric with right and goal would be symmetric volume and cleavage in bra.  Reviewed saline vs silicone. Silicone may offer options for less visible rippling however requires MRI or [place] surveillance for rupture. Both share rupture CC infection requiring removal rippling, implants are not permanent devices and may require additional surgery. Plan smooth round silicone capacity filled. Reviewed size will be larger than in her expander but smaller than right breast current volume.  Discussed fat grafting purpose to thicken mastectomy flap limit visible rippling. Reviewed donor site pain, need for compression, variable take graft, fat necrosis that presents  as lumps. Plan lipofilling left  chest from flanks as donor site.  Discussed risk COVID infection through this elective surgery. She will receive COVID testing prior to surgery. Discussed even if patient receives a negative test result, the tests in some cases may fail to detect the virus or patient may contract COVID after the test. COVID 19 infection before/during/after surgery may result in lead to a higher chance of complication and death. We also discussed that I have not been tested and staff at surgery center not routinely tested  Additional risks seroma hematoma nipple areola necrosis right, fat necrosis, DVT/PE, damage to deeper structures,need for additional procedures, unacceptable cosmetic result, cardiopulmonary complications reviewed.  Rx for oxycodone, Bactrim, Flexeril given previously- states she has lost these and needs new Rx  Natrelle [id number] 500 tissue expander placed,  fill volume 495 ml saline.   [name], MD [facility]astic [facility] Re[facility] [phone], pin [phone]

## 2019-03-07 NOTE — Transfer of Care (Signed)
Immediate Anesthesia Transfer of Care Note  Patient: Casey Baker  Procedure(s) Performed: REMOVAL OF LEFT TISSUE EXPANDER AND PLACEMENT OF SILICONE IMPLANT (Left Breast) LIPOFILLING FROM ABDOMEN TO LEFT CHEST (Left Breast) RIGHT BREAST MASTOPEXY (Right Breast)  Patient Location: PACU  Anesthesia Type:General  Level of Consciousness: sedated  Airway & Oxygen Therapy: Patient Spontanous Breathing and Patient connected to face mask oxygen  Post-op Assessment: Report given to RN and Post -op Vital signs reviewed and stable  Post vital signs: Reviewed and stable  Last Vitals:  Vitals Value Taken Time  BP 129/80 03/07/2019  2:16 PM  Temp    Pulse 96 03/07/2019  2:18 PM  Resp 14 03/07/2019  2:18 PM  SpO2 100 % 03/07/2019  2:18 PM  Vitals shown include unvalidated device data.  Last Pain:  Vitals:   03/07/19 0951  TempSrc: Oral  PainSc: 0-No pain         Complications: No apparent anesthesia complications

## 2019-03-07 NOTE — Op Note (Signed)
Operative Note   DATE OF OPERATION: 5.19.20  LOCATION: [facility]-outpatient  SURGICAL DIVISION: Plastic Surgery  PREOPERATIVE DIAGNOSES:  1. History breast cancer 2. Acquired absence breast 3. History therapeutic radiation  POSTOPERATIVE DIAGNOSES:  same  PROCEDURE:  1. Removal left chest tissue expander and placement silicone implant 2. Lipofilling from flanks to left chest 3. Right breast mastopexy  SURGEON: [name] MD MBA  ASSISTANT: none  ANESTHESIA:  General.   EBL: 559 ml  COMPLICATIONS: None immediate.   INDICATIONS FOR PROCEDURE:  The patient, Casey Baker, is a 48 y.o. female born on 23-Oct-1970, is here for staged breast reconstruction following left skin reduction mastectomy and immediate expander acellular dermis prepectoral reconstruction. She received adjuvant radiation.   FINDINGS: Right breast mastopexy 286 g. Left chest Natrelle Smooth Round Inspira Extra Projection 615 ml implant placed REF [id number]. 70 ml fat infiltrated over left chest.  DESCRIPTION OF PROCEDURE:  The patient's operative site was marked with the patient in the preoperative area including chest midline, sternal notch, breast meridians, anterior axillary lines. Bilateral flanks marked for fat grafting donor site. Over right breast location of nipple position marked at location inframammary fold by palpation through breast. Breast displaced medially and laterally against meridian, and with aid with Wise pattern marker, vertical limbs marked (8 cm). The patient was taken to the operating room. SCDs were placed and IV antibiotics were given. The patient's operative site was prepped and draped in a sterile fashion. A time out was performed and all information was confirmed to be correct. Incision made in left IMF scar and carried through superficial fascia and ADM. Expander removed. Capsulotomies performed medially, superiorly and laterally. Complete incorporation ADM noted.  Sizer placed.  In supine position, inframammary fold marked over right and superior medial pedicle designed. NAC marked with 42 mm cookie cutter and pedicle de epithelialized. Superiorly based pedicle maintained and medial and lateral flaps developed. Lower pole annd lateral breast tissue excised. Cavity irrigated. Hemostasis obtained. Local anesthetic infiltrated. 15 Fr JP placed and secured with 2-0 nylon. Breast tailor tacked closed.  Patient brought to upright sitting position and Natrelle Extra Projection 615 implant selected. Patient returned to supine position.  Stab incision madeover bilateral lateral abdomenand tumescent fluid infiltrated over supra and infraumbilical abdomen, [id number]. Power assisted liposuction performed to endpoint symmetric soft tissue thickness. Total lipoaspirate 200 ml. Fat washed with saline and separated by gravity. Fat infiltrated in subcutaneous planethroughoutmastectomy flap on left. Abdominal incisions approximated with interrupted 4-0 monocryl  Over right breast, closure completed with 3-0 vicryl in dermis along vertical closure and inframammary fold. NAC inset with 4-0 vicryl in dermis. Skin closure completed with 4-0 monocryl subcuticular closure. Over left chest, cavity irrigated with saline solution containing Ancef, gentamicin, bacitracin. Hemostasis ensured. Cavity irrigated with Betadine. Implant placed in cavity, orientation ensured. Closure completed with 3-0 vicryl for closure ADM and superficial fascia, 4-0 vicryl in dermis, and skin closure 4-0 monocryl subcuticular. Dermabond applied to all breast incisions. Dry dressing, breast binder and abdominal binder applied.   The patient was allowed to wake from anesthesia, extubated and taken to the recovery room in satisfactory condition.   SPECIMENS: right breast mastopexy  DRAINS: 15 Fr JP right breast  [name], MD [facility] [phone], pin [phone]

## 2019-03-07 NOTE — Anesthesia Preprocedure Evaluation (Signed)
Anesthesia Evaluation  Patient identified by MRN, date of birth, ID band Patient awake    Reviewed: Allergy & Precautions, NPO status , Patient's Chart, lab work & pertinent test results  History of Anesthesia Complications (+) PONV  Airway Mallampati: II  TM Distance: >3 FB Neck ROM: Full    Dental no notable dental hx. (+) Upper Dentures   Pulmonary neg pulmonary ROS,    Pulmonary exam normal breath sounds clear to auscultation       Cardiovascular hypertension, Pt. on medications Normal cardiovascular exam Rhythm:Regular Rate:Normal     Neuro/Psych Anxiety Depression negative neurological ROS  negative psych ROS   GI/Hepatic negative GI ROS, Neg liver ROS,   Endo/Other  negative endocrine ROS  Renal/GU negative Renal ROS  negative genitourinary   Musculoskeletal negative musculoskeletal ROS (+)   Abdominal   Peds negative pediatric ROS (+)  Hematology negative hematology ROS (+)   Anesthesia Other Findings   Reproductive/Obstetrics negative OB ROS                             Anesthesia Physical Anesthesia Plan  ASA: II  Anesthesia Plan: General   Post-op Pain Management:    Induction: Intravenous  PONV Risk Score and Plan: 4 or greater and Ondansetron, Dexamethasone, Midazolam, Scopolamine patch - Pre-op and Treatment may vary due to age or medical condition  Airway Management Planned: LMA and Oral ETT  Additional Equipment:   Intra-op Plan:   Post-operative Plan: Extubation in OR  Informed Consent: I have reviewed the patients History and Physical, chart, labs and discussed the procedure including the risks, benefits and alternatives for the proposed anesthesia with the patient or authorized representative who has indicated his/her understanding and acceptance.     Dental advisory given  Plan Discussed with: CRNA  Anesthesia Plan Comments:          Anesthesia Quick Evaluation

## 2019-03-07 NOTE — Anesthesia Postprocedure Evaluation (Signed)
Anesthesia Post Note  Patient: Casey Baker  Procedure(s) Performed: REMOVAL OF LEFT TISSUE EXPANDER AND PLACEMENT OF SILICONE IMPLANT (Left Breast) LIPOFILLING FROM ABDOMEN TO LEFT CHEST (Left Breast) RIGHT BREAST MASTOPEXY (Right Breast)     Patient location during evaluation: PACU Anesthesia Type: General Level of consciousness: awake and alert Pain management: pain level controlled Vital Signs Assessment: post-procedure vital signs reviewed and stable Respiratory status: spontaneous breathing, nonlabored ventilation, respiratory function stable and patient connected to nasal cannula oxygen Cardiovascular status: blood pressure returned to baseline and stable Postop Assessment: no apparent nausea or vomiting Anesthetic complications: no    Last Vitals:  Vitals:   03/07/19 1530 03/07/19 1549  BP: 136/85 132/84  Pulse: 100 93  Resp: 16 14  Temp:  37.1 C  SpO2: 97% 100%    Last Pain:  Vitals:   03/07/19 1549  TempSrc: Oral  PainSc:                  Jenin Birdsall COKER

## 2019-03-07 NOTE — Anesthesia Procedure Notes (Signed)
Procedure Name: Intubation Date/Time: 03/07/2019 10:51 AM Performed by: Maryella Shivers, CRNA Pre-anesthesia Checklist: Patient identified, Emergency Drugs available, Suction available and Patient being monitored Patient Re-evaluated:Patient Re-evaluated prior to induction Oxygen Delivery Method: Circle system utilized Preoxygenation: Pre-oxygenation with 100% oxygen Induction Type: IV induction Ventilation: Mask ventilation without difficulty Laryngoscope Size: Mac and 3 Grade View: Grade I Tube type: Oral Number of attempts: 1 Airway Equipment and Method: Stylet and Oral airway Placement Confirmation: ETT inserted through vocal cords under direct vision,  positive ETCO2 and breath sounds checked- equal and bilateral Secured at: 20 cm Tube secured with: Tape Dental Injury: Teeth and Oropharynx as per pre-operative assessment

## 2019-03-08 ENCOUNTER — Encounter (HOSPITAL_BASED_OUTPATIENT_CLINIC_OR_DEPARTMENT_OTHER): Payer: Self-pay | Admitting: Plastic Surgery

## 2019-03-16 ENCOUNTER — Other Ambulatory Visit: Payer: Self-pay | Admitting: Hematology

## 2019-03-16 ENCOUNTER — Other Ambulatory Visit: Payer: Self-pay | Admitting: Nurse Practitioner

## 2019-03-17 ENCOUNTER — Other Ambulatory Visit: Payer: Self-pay | Admitting: Hematology

## 2019-03-17 MED ORDER — ALPRAZOLAM 0.5 MG PO TABS: 0.5000 mg | ORAL_TABLET | Freq: Every evening | ORAL | 0 refills | Status: DC | PRN

## 2019-03-22 ENCOUNTER — Telehealth: Payer: Self-pay

## 2019-03-22 ENCOUNTER — Other Ambulatory Visit: Payer: Self-pay | Admitting: Nurse Practitioner

## 2019-03-22 NOTE — Telephone Encounter (Signed)
Gave request to Cira Rue NP

## 2019-03-22 NOTE — Telephone Encounter (Signed)
Spoke with pt to confirm Ambien usage for refill request. Per pt she is using Ambien every night and it is helping.

## 2019-03-23 ENCOUNTER — Other Ambulatory Visit: Payer: Self-pay | Admitting: Hematology

## 2019-03-23 MED ORDER — ALPRAZOLAM 0.5 MG PO TABS: 0.5000 mg | ORAL_TABLET | Freq: Two times a day (BID) | ORAL | 0 refills | Status: DC | PRN

## 2019-03-23 NOTE — Telephone Encounter (Signed)
I called pt regarding her ambien refill request. She is currently using Xanax 0.5mg  in the morning for anxiety which helps well, and ambien 5mg  at night. She did try Xanax at night for sleep also which also works well. I recommend her to stop ambien and just use xanax twice daily for anxiety and insomnia. She agrees. Will refill Xanax #60 on 04/03/2019.  Truitt Merle  03/23/2019

## 2019-04-11 NOTE — Progress Notes (Signed)
CLINIC:  Survivorship   REASON FOR VISIT:  Routine follow-up post-treatment for a recent history of breast cancer.  Patient Care Team: Patient, [name] - General (General Practice) Casey Seltzer, MD as Consulting Physician (General Surgery) Casey Merle, MD as Consulting Physician (Hematology) Casey Pray, MD as Consulting Physician (Radiation Oncology) Casey Feeling, NP as Nurse Practitioner (Nurse Practitioner)   BRIEF ONCOLOGIC HISTORY:  Oncology History Overview Note  Cancer Staging Ductal carcinoma in situ (DCIS) of left breast Staging form: Breast, AJCC 8th Edition - Clinical stage from 02/09/2018: Stage 0 (cTis (DCIS), cN0, cM0, ER+, PR+) - Unsigned - Pathologic stage from 03/18/2018: Stage IA (pT56m, pN1a, cM0, G1, ER+, PR+, HER2-) - Signed by Casey Merle MD on 03/28/2018     Malignant neoplasm of upper-outer quadrant of left female breast ([facility]  01/27/2018 Mammogram   IMPRESSION: Suspicious bloody left nipple discharge with palpable masses in the 2 o'clock axis of the left breast. Findings are suspicious for Malignancy. ADDENDUM: Magnification views of the retroareolar right breast were performed to evaluate calcifications seen on the patient's baseline mammogram. There are loosely grouped and scattered calcifications in the outer right breast, superior to and directly posterior to the nipple. These calcifications are rounded and smudgy in the CC projection, and many of them demonstrate layering on the 90 degree lateral magnification view, consistent with benign milk of calcium. No suspicious microcalcifications are seen in the right breast.   01/31/2018 Initial Biopsy   Diagnosis 01/31/18 1. 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 - NEGATIVE FOR INVASIVE CARCINOMA. 2. Breast, left, needle core biopsy, 2 o'clock, 10 cfn - LOW GRADE DUCTAL CARCINOMA  IN SITU (DCIS) PARTIALLY INVOLVING AN INTRADUCTAL PAPILLOMA. - NEGATIVE FOR INVASIVE CARCINOMA.   01/31/2018 Receptors her2   Prognostic indicators significant for: ER, 100% positive and PR, 100% positive, both with strong staining intensity.    02/03/2018 Initial Diagnosis   Ductal carcinoma in situ (DCIS) of left breast   02/10/2018 Genetic Testing   Testing did not reveal a pathogenic mutation in any of the genes analyzed.A copy of the genetic test report will be scanned into Epic under the Media tab. The genes analyzed were the 23 genes on [facility]'s Breast/GYN panel (ATM, BARD1, BRCA1, BRCA2, BRIP1, CDH1, CHEK2, DICER1, EPCAM, MLH1,  MSH2, MSH6, NBN, NF1, PALB2, PMS2, PTEN, RAD50, RAD51C, RAD51D,SMARCA4, STK11, and TP53).  Genetic testing involved analysis of 9 genes: ATM, BRCA1, BRCA2, CDH1, CHEK2, PALB2, PTEN, STK11 and TP53 genes. Testing was normal and did not reveal a mutation in these genes.     03/18/2018 Surgery   TOTAL LEFT MASTECTOMY WITH AXILLARY SENTINEL LYMPH NODE BIOPSY and  LEFT BREAST RECONSTRUCTION WITH PLACEMENT OF TISSUE EXPANDER AND FLEX HD (ACELLULAR HYDRATED DERMIS) by Dr. HExcell Seltzerand Dr. [name] 03/18/18   03/18/2018 Pathology Results   Diagnosis 03/18/18 1. Lymph node, sentinel, biopsy, Left Axillary #1 - METASTATIC CARCINOMA IN ONE OF ONE LYMPH NODES (1/1). 2. Lymph node, sentinel, biopsy, Left Axillary #2 - ONE OF ONE LYMPH NODES NEGATIVE FOR CARCINOMA (0/1). 3. Breast, simple mastectomy, Left Total - MICROINVASIVE DUCTAL CARCINOMA ARISING IN A BACKGROUND OF LOW GRADE DUCTAL CARCINOMA IN SITU. - DUCTAL CARCINOMA IN SITU PARTIALLY INVOLVES AN INTRADUCTAL PAPILLOMA. - RESECTION MARGINS ARE NEGATIVE FOR INVASIVE CARCINOMA. - IN SITU CARCINOMA IS PRESENT AT THE ANTERIOR MARGIN BROADLY. - BIOPSY SITE. - SEE ONCOLOGY TABLE.  03/18/2018 Receptors her2   Estrogen Receptor: 100%, POSITIVE, MODERATE-WEAK STAINING INTENSITY Progesterone Receptor: 100%,  POSITIVE, STRONG STAINING INTENSITY Proliferation Marker Ki67: 1% HER2 - NEGATIVE   03/18/2018 Cancer Staging   Staging form: Breast, AJCC 8th Edition - Pathologic stage from 03/18/2018: Stage IA (pT35m, pN1a, cM0, G1, ER+, PR+, HER2-) - Signed by Casey Merle MD on 03/28/2018   05/18/2018 - 06/27/2018 Radiation Therapy   Adjuvant radiation by Dr. KSondra Baker   06/27/2018 Mammogram   06/27/2018 Mammogram IMPRESSION: Benign appearing right breast calcifications.  Recommendation: Screening right mammogram is suggested in April 2020.   07/19/2018 -  Anti-estrogen oral therapy   Adjuvant tamoxifen     Survivorship   Per LCira Rue NP      INTERVAL HISTORY:  Ms. EHopwoodp[facility] for our initial meeting to review her survivorship care plan detailing her treatment course for breast cancer, as well as monitoring long-term side effects of that treatment, education regarding health maintenance, screening, and overall wellness and health promotion.     Overall, Ms. ELoceyis doing well. She recently had breast reconstruction and is very happy with result. Has left arm soreness and Baker tight occasionally with more activity. Denies swelling. She held tamoxifen during surgery and has resumed. She has mild to severe hot flashes, mostly at night. She notes mild joint aches in her knees and leg cramps. Symptoms are tolerable. Appetite is normal. Fatigue fluctuates. She is not working right now. She walks 1-2 miles per day and is eating healthier, cut out sweats, dairy, and most caffeine. She ran out of lisinopril a while ago. She notes her heart rate is always high, she gets nervous here. Has occasional blurred vision when BP is high, denies headache, chest pain, dizziness. Does not check BP at home. Denies n/v/c/d, cough, dyspnea, fever, chills, leg swelling, bleeding.   ONCOLOGY TREATMENT TEAM:  1. Surgeon:  Dr. HExcell Baker [facility] 2. Medical Oncologist: Dr.  FBurr Baker 3. Radiation Oncologist: Dr. KSondra Baker   PAST MEDICAL/SURGICAL HISTORY:  Past Medical History:  Diagnosis Date   Anemia    Anxiety    Ductal carcinoma in situ (DCIS) of left breast 02/03/2018   Family history of breast cancer    Family history of ovarian cancer    Genetic testing 02/21/2018   STAT Breast panel with reflex to Breast/GYN panel (23 genes) @ [facility] - No pathogenic mutations detected   Hypertension    PONV (postoperative nausea and vomiting)    Uterine fibroid    Past Surgical History:  Procedure Laterality Date   AXILLARY SURGERY     BREAST RECONSTRUCTION WITH PLACEMENT OF TISSUE EXPANDER AND FLEX HD (ACELLULAR HYDRATED DERMIS) Left 03/18/2018   Procedure: LEFT BREAST RECONSTRUCTION WITH PLACEMENT OF TISSUE EXPANDER AND FLEX HD (ACELLULAR HYDRATED DERMIS);  Surgeon: TIrene Limbo MD;  [facility]  Service: Plastics;  Laterality: Left;   DENTAL SURGERY N/A 07/08/2018   KNEE ARTHROSCOPY Left 2013   LIPOSUCTION WITH LIPOFILLING Left 03/07/2019   Procedure: LIPOFILLING FROM ABDOMEN TO LEFT CHEST;  Surgeon: TIrene Limbo MD;  [place] [facility]  Service: Plastics;  Laterality: Left;   MASTECTOMY W/ SENTINEL NODE BIOPSY Left 03/18/2018   Procedure: TOTAL LEFT MASTECTOMY WITH AXILLARY SENTINEL LYMPH NODE BIOPSY;  Surgeon: Casey Seltzer MD;  [facility]  Service: General;  Laterality: Left;   MASTOPEXY Right 03/07/2019   Procedure: RIGHT BREAST MASTOPEXY;  Surgeon: TIrene Limbo  MD;  Location: [facility];  Service: Plastics;  Laterality: Right;   REMOVAL OF TISSUE EXPANDER AND PLACEMENT OF IMPLANT Left 03/07/2019   Procedure: REMOVAL OF LEFT TISSUE EXPANDER AND PLACEMENT OF SILICONE IMPLANT;  Surgeon: [name], MD;  Location: [facility];  Service: Plastics;  Laterality: Left;   TOOTH EXTRACTION N/A 07/08/2018   Procedure: DENTAL  RESTORATION/EXTRACTIONS;  Surgeon: Diona Browner, DDS;  Location: [place];  Service: Oral Surgery;  Laterality: N/A;   TUBAL LIGATION       ALLERGIES:  No Known Allergies   CURRENT MEDICATIONS:  Outpatient Encounter Medications as of 04/19/2019  Medication Sig   ALPRAZolam (XANAX) 0.5 MG tablet Take 1 tablet (0.5 mg total) by mouth 2 (two) times daily as needed for anxiety or sleep.   cyclobenzaprine (FLEXERIL) 10 MG tablet Take 1 tablet (10 mg total) by mouth 3 (three) times daily as needed for muscle spasms.   lisinopril (ZESTRIL) 10 MG tablet Take 1 tablet (10 mg total) by mouth daily.   tamoxifen (NOLVADEX) 20 MG tablet TAKE 1 TABLET(20 MG) BY MOUTH DAILY   [DISCONTINUED] ALPRAZolam (XANAX) 0.5 MG tablet Take 1 tablet (0.5 mg total) by mouth 2 (two) times daily as needed for anxiety or sleep.   [DISCONTINUED] lisinopril (PRINIVIL,ZESTRIL) 10 MG tablet Take 1 tablet (10 mg total) by mouth daily.   oxyCODONE (ROXICODONE) 5 MG immediate release tablet Take 1 tablet (5 mg total) by mouth every 4 (four) hours as needed.   sulfamethoxazole-trimethoprim (BACTRIM DS) 800-160 MG tablet Take 1 tablet by mouth 2 (two) times daily.   zolpidem (AMBIEN) 5 MG tablet Take 1 tablet (5 mg total) by mouth at bedtime as needed for sleep.   No facility-administered encounter medications on file as of 04/19/2019.      ONCOLOGIC FAMILY HISTORY:  Family History  Problem Relation Age of Onset   Hypertension Mother    Ovarian cancer Maternal Grandmother        dx 66s; deceased 35s. She declined therapy of any kind including surgery and chemo   Breast cancer Cousin        dx 36s; currently 53s; daughter of matenral uncle   Leukemia Maternal Aunt        currently 95     GENETIC COUNSELING/TESTING: Negative result   SOCIAL HISTORY:  HAEVEN NICKLE has adult children, with grandchildren. She is active, does not smoke or drink alcohol.    PHYSICAL EXAMINATION:  Vital Signs:   Vitals:     04/19/19 0957  BP: (!) 143/93  Pulse: (!) 115  Resp: 18  Temp: 98.5 F (36.9 C)  SpO2: 100%   Filed Weights   04/19/19 0957  Weight: 177 lb 6.4 oz (80.5 kg)   General: Well-nourished, well-appearing female in no acute distress.   HEENT: Head is normocephalic.  Sclerae anicteric.  Lymph: No palpable cervical, supraclavicular, infraclavicular, or axillary lymphadenopathy   Cardiovascular: tachycardic, regular rhythm.Marland Kitchen Respiratory: Clear to auscultation bilaterally. Chest expansion symmetric; breathing non-labored.  GI: Abdomen soft and round; non-tender  GU: Deferred.  Neuro: No focal deficits. Steady gait.  Psych: Mood and affect normal and appropriate for situation.  Extremities: No edema. MSK: Full range of motion in bilateral upper extremities Skin: Warm and dry. Breast exam reveals left mastectomy and bilateral reconstruction. Incisions are completely healed. Right breast is firm and lumpy throughout, no discrete mass. Left breast with implant reveals no palpable mass, mild skin hyperpigmentation  LABORATORY DATA:  None for  this visit.  DIAGNOSTIC IMAGING:  None for this visit.      ASSESSMENT AND PLAN:  Casey Baker is a pleasant 48 y.o. 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  She presents to the Survivorship Clinic for our initial meeting and routine follow-up post-completion of treatment for breast cancer.    1. Stage IA left breast cancer:  Casey Baker is continuing to recover from definitive treatment for breast cancer. No clinical concern for recurrence on exam today. She will follow-up with her medical oncologist, Dr. Burr Baker in 07/2019 with history and physical exam per surveillance protocol.  She will continue her anti-estrogen therapy with Tamoxifen. Thus far, she is tolerating with  well overall with tolerable hot flashes. She was instructed to make Dr. Burr Baker or myself aware if she begins to experience any worsening side effects of the medication and I could see her back in clinic to help manage those side effects, as needed. Though the incidence is low, there is an associated risk of endometrial cancer with anti-estrogen therapies like Tamoxifen.  Casey Baker was encouraged to contact Dr. Burr Baker or myself with any vaginal bleeding while taking Tamoxifen. Other side effects of Tamoxifen were again reviewed with her as well, especially increased risk of DVT. Her next right side mammogram is due in 08/2019; breast density category C. Today, a comprehensive survivorship care plan and treatment summary was reviewed with the patient today detailing her breast cancer diagnosis, treatment course, potential late/long-term effects of treatment, appropriate follow-up care with recommendations for the future, and patient education resources.  A copy of this summary, along with a letter will be sent to the patients primary care provider via mail/fax/In Basket message after todays visit.    2. HTN BP consistently elevated in clinic, HR also elevated she attributes to being nervous at cancer Baker follow up. She has occasional blurred vision when she feels that BP is high. She does not have PCP, we have been filling lisinopril, I refilled for 3 months to give her time to establish with PCP. I referred her today  3. Hot flash - mild to severe, secondary to tamoxifen These are tolerable overall, she declined medication such as SSRI or gabapentin. She manages well  4. Intermittent left arm tightness, swelling She plans to call PT to resume therapy and inquire about compression sleeve. Exam is normal today  5. Bone health:  When you become postmenopausal and/or change endocrine therapy to aromatase inhibitor, we will begin monitoring your bone density. We reviewed tamoxifen has a bone strengthening  quality.  In the meantime, she was encouraged to increase her consumption of foods rich in calcium, as well as increase her weight-bearing activities.  She was given education on specific activities to promote bone health.  6. Cancer screening:  Due to Casey Baker's history and her age, she should receive screening for skin cancers and gynecologic cancers.  She is due for pelvic exam, she will [facility]. She is considering BSO next year after to recovers from breast surgeries. She denies strong history of colorectal cancer, change in bowel habits, or GI bleeding. She will be referred to GI for colon cancer screening at age 5. The information and recommendations are listed on the patient's comprehensive care plan/treatment summary and were reviewed in detail with the patient.    7. Health  maintenance and wellness promotion: Casey Baker was encouraged to consume 5-7 servings of fruits and vegetables per day. We reviewed the "Nutrition Rainbow" handout, as well as the handout "Take Control of Your Health and Reduce Your Cancer Risk" from the [employer].  She was also encouraged to engage in moderate to vigorous exercise for 30 minutes per day most days of the week. We discussed the [employer] fitness program, which is designed for cancer survivors to help them become more physically fit after cancer treatments.  She was instructed to limit her alcohol consumption and continue to abstain from tobacco use.     8. Support services/counseling: It is not uncommon for this period of the patient's cancer care trajectory to be one of many emotions and stressors.  We discussed an opportunity for her to participate in the next session of [facility] ("Finding Your New Normal") support group series designed for patients after they have completed treatment.   Casey Baker was encouraged to take advantage of our many other support services programs, support groups, and/or counseling in  coping with her new life as a cancer survivor after completing anti-cancer treatment.  She was offered support today through active listening and expressive supportive counseling.  She was given information regarding our available services and encouraged to contact me with any questions or for help enrolling in any of our support group/programs.    Dispo:   -Continue surveillance, continue tamoxifen  -BP - refilled lisinopril, refer to PCP -Refilled xanax -Return for labs 2 weeks to monitor CBC and check renal function -GYN - due for pelvic exam, [facility]  -She will call PT to resume treatment and inquire about compression sleeve -F/u Dr. Iran Planas 7/19 -Mammo due 08/2019 -F/u Dr. Burr Baker 07/2019  -She is welcome to return back to the Survivorship Clinic at any time; no additional follow-up needed at this time.  -Consider referral back to survivorship as a long-term survivor for continued surveillance  Orders Placed This Encounter  Procedures   Ambulatory referral to Internal Medicine    Referral Priority:   Routine    Referral Type:   Consultation    Referral Reason:   Specialty Services Required    Requested Specialty:   Internal Medicine    Number of Visits Requested:   1    A total of (30) minutes of face-to-face time was spent with this patient with greater than 50% of that time in counseling and care-coordination.   Cira Rue, NP Survivorship Program [facility] [phone]   Note: PRIMARY CARE PROVIDER Patient, No Pcp Per None None

## 2019-04-16 IMAGING — US US PELVIS COMPLETE TRANSABD/TRANSVAG
1 series · 15 of 25 positions shown · non-contrast
Comparison: CT abdomen and pelvis 11/15/2014, pelvic ultrasound
12/18/2009

CLINICAL DATA: Abnormal uterine bleeding, perimenopausal symptoms,
fibroids

EXAM:
TRANSABDOMINAL AND TRANSVAGINAL ULTRASOUND OF PELVIS
TECHNIQUE: Both transabdominal and transvaginal ultrasound examinations of the
pelvis were performed. Transabdominal technique was performed for
global imaging of the pelvis including uterus, ovaries, adnexal
regions, and pelvic cul-de-sac. It was necessary to proceed with
endovaginal exam following the transabdominal exam to visualize the
endometrium and ovaries.

[Series 1: us pelvis complete transabd/transvag · 15 of 58 slices shown]
[im 1/58]
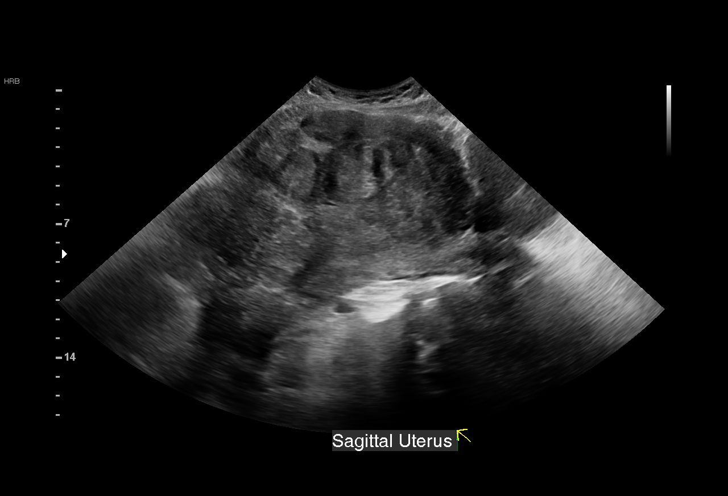
[im 5/58]
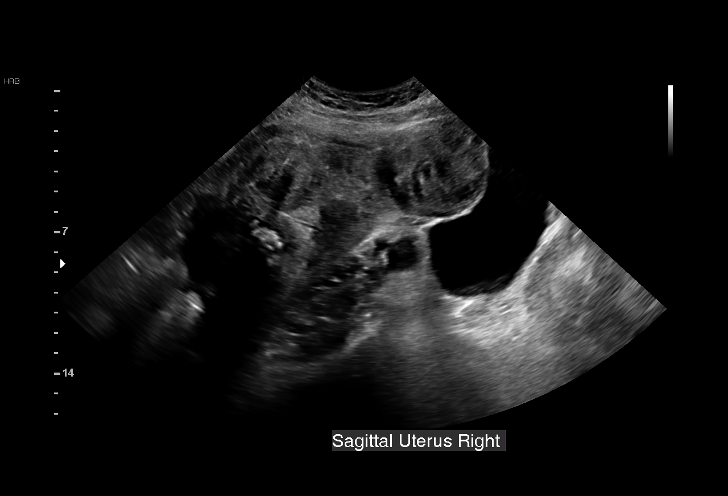
[im 10/58]
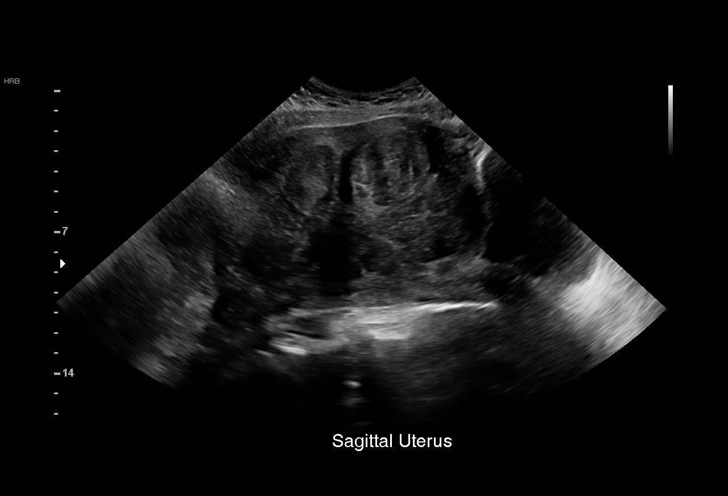
[im 12/58]
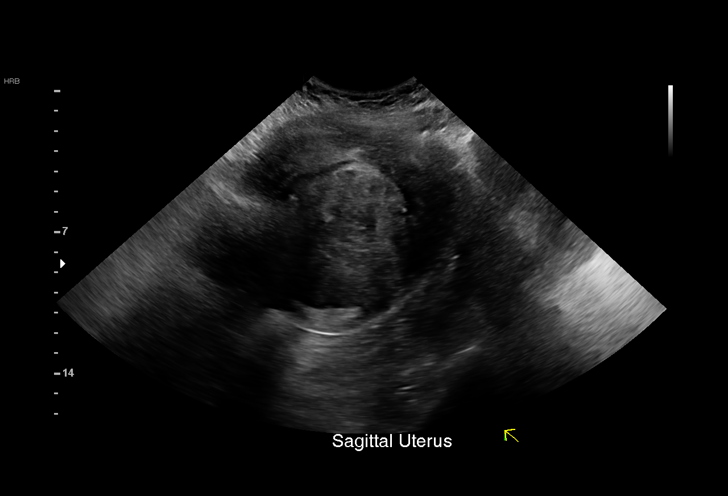
[im 17/58]
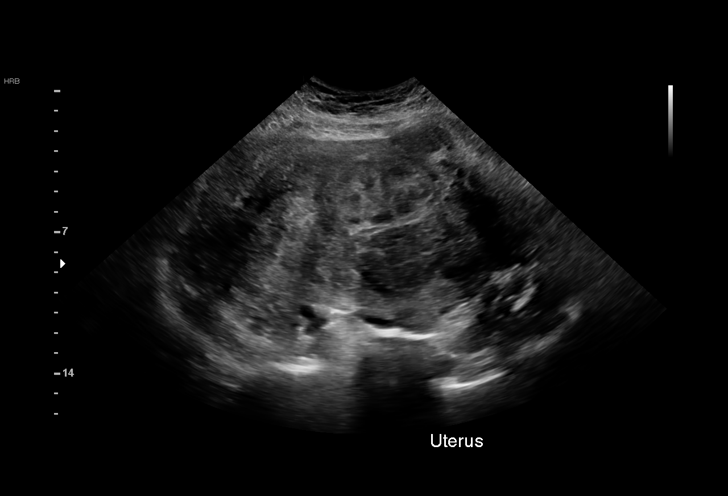
[im 22/58]
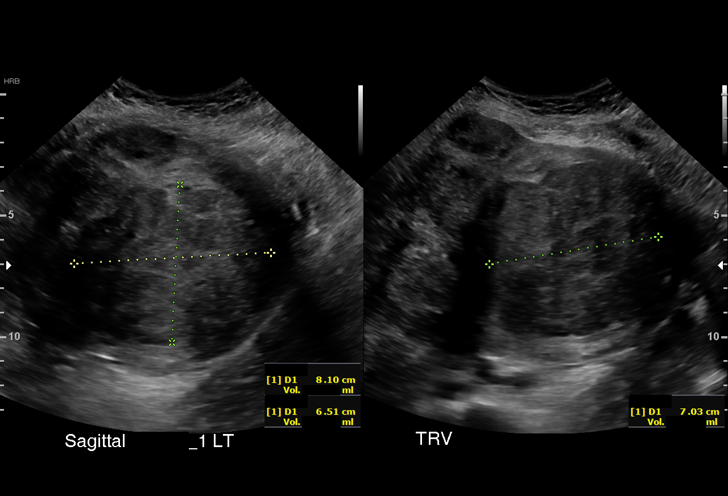
[im 24/58]
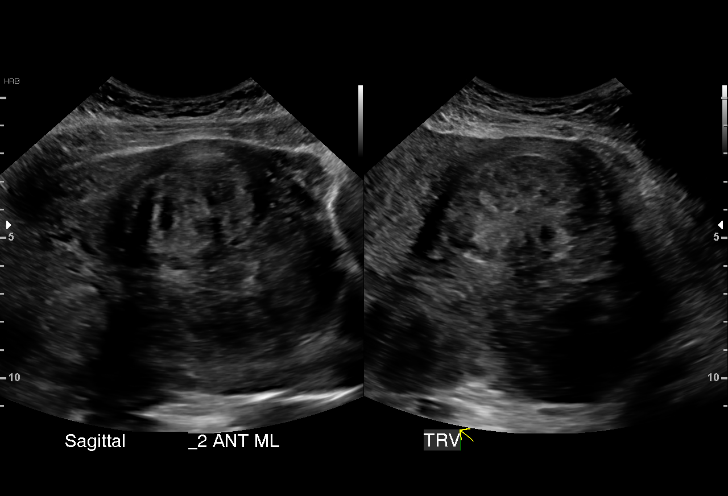
[im 29/58]
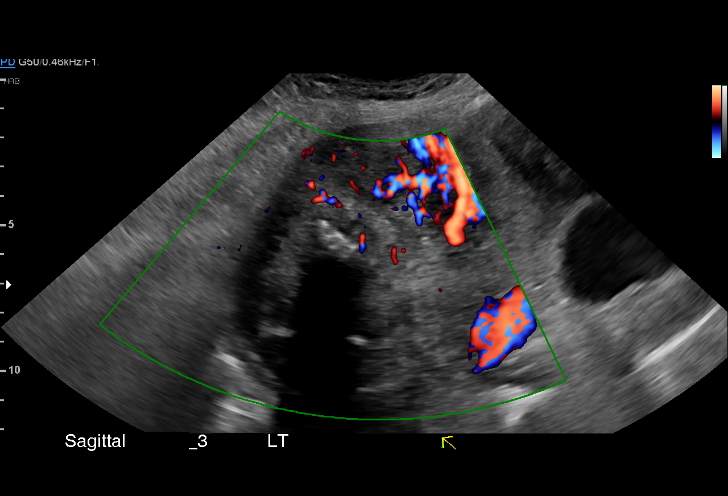
[im 34/58]
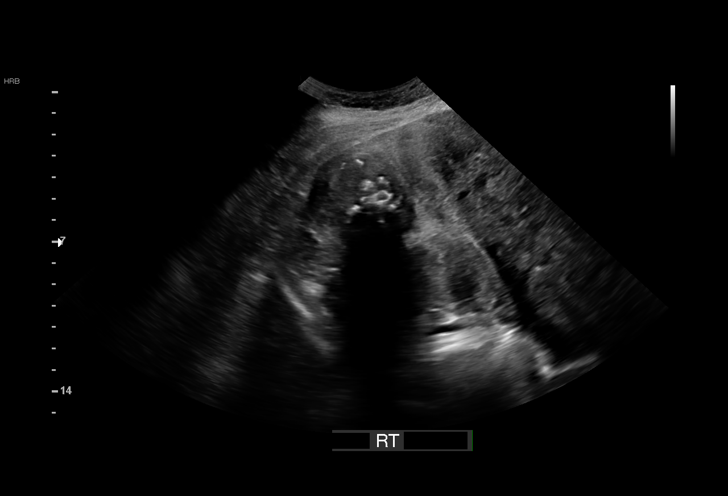
[im 36/58]
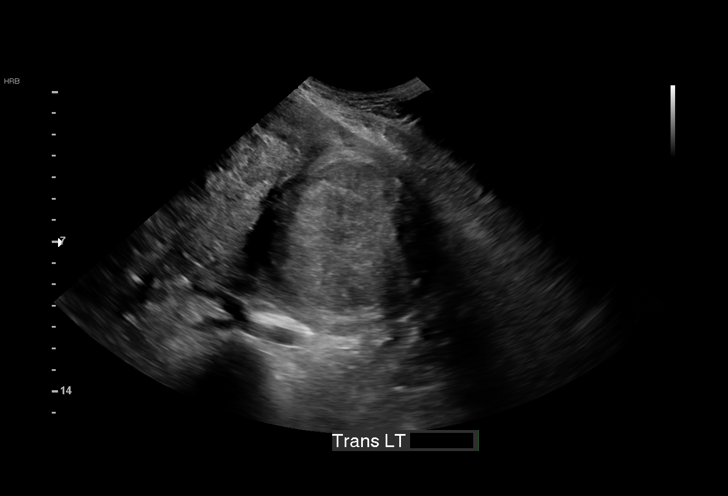
[im 41/58]
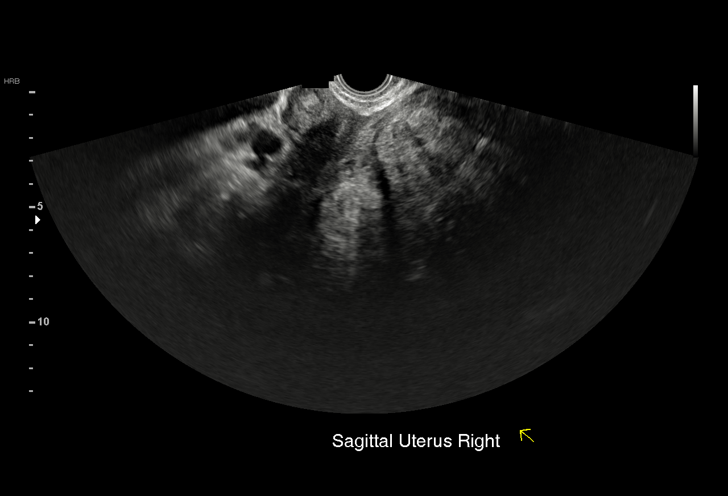
[im 46/58]
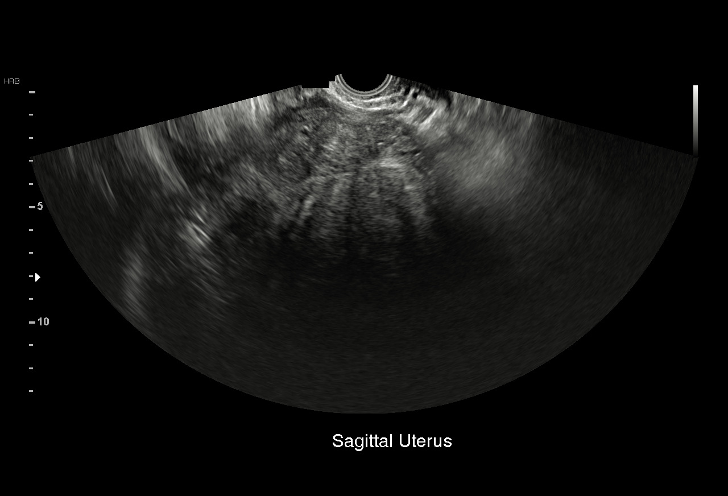
[im 48/58]
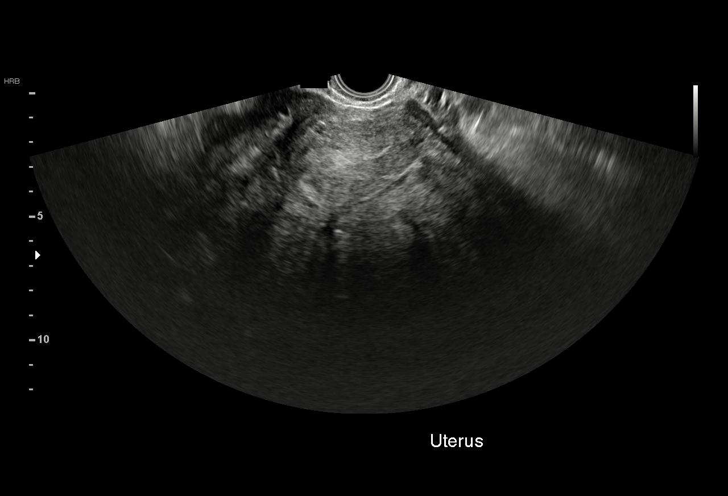
[im 53/58]
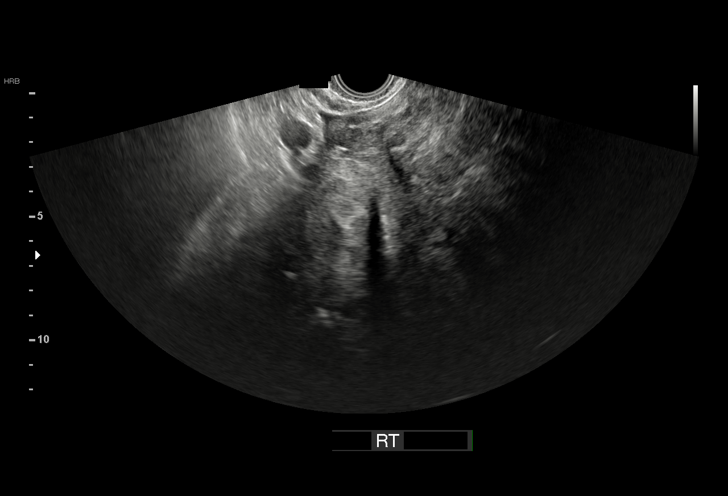
[im 58/58]
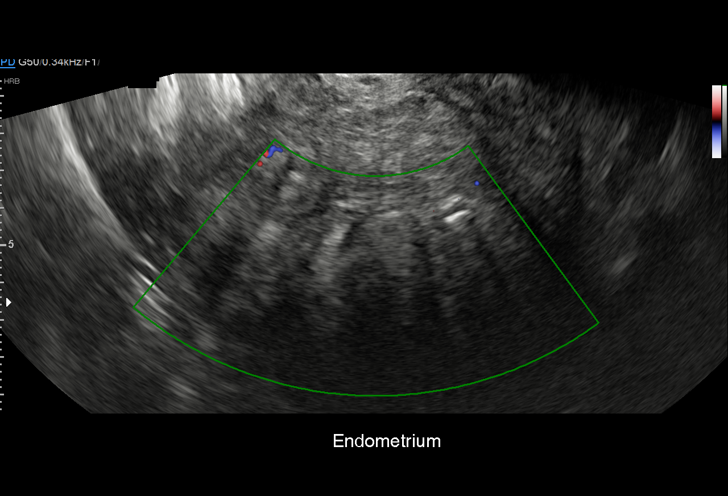

[15 of 25 positions shown; findings below may reference images not displayed]

FINDINGS: Uterus

Measurements: 17.5 x 15.7 x 18.4 cm. Enlarged nodular uterus
containing multiple masses consistent with leiomyomata. Notable
lesions include a large posterior LEFT intramural leiomyoma at the
mid uterus 8.1 x 6.5 x 7.0 cm, an anterior intramural leiomyoma at
the upper uterine segment 5.5 x 3.8 x 5.3 cm, and a posterior RIGHT
intramural leiomyoma 5.6 x 3.7 x 4.0 cm. Numerous additional
leiomyomata are seen.

Endometrium

Inadequately delineated, likely obscured/distorted by the numerous
leiomyomata visualized

Right ovary

Nonvisualized likely due to a combination of displacement by
enlarged uterus and obscuration by bowel

Left ovary

Nonvisualized likely due to a combination of displacement by
enlarged uterus and obscuration by bowel

Other findings

No free pelvic fluid.  No adnexal masses.
IMPRESSION: Enlarged nodular uterus containing numerous leiomyomata as above.

Nonvisualization of endometrial complex and ovaries.

## 2019-04-19 ENCOUNTER — Telehealth: Payer: Self-pay | Admitting: Nurse Practitioner

## 2019-04-19 ENCOUNTER — Encounter: Payer: Self-pay | Admitting: Nurse Practitioner

## 2019-04-19 ENCOUNTER — Inpatient Hospital Stay: Payer: Medicaid Other | Attending: Hematology | Admitting: Nurse Practitioner

## 2019-04-19 ENCOUNTER — Other Ambulatory Visit: Payer: Self-pay

## 2019-04-19 VITALS — BP 143/93 | HR 115 | Temp 98.5°F | Resp 18 | Ht 67.0 in | Wt 177.4 lb

## 2019-04-19 DIAGNOSIS — Z923 Personal history of irradiation: Secondary | ICD-10-CM | POA: Diagnosis not present

## 2019-04-19 DIAGNOSIS — I10 Essential (primary) hypertension: Secondary | ICD-10-CM | POA: Diagnosis not present

## 2019-04-19 DIAGNOSIS — D0512 Intraductal carcinoma in situ of left breast: Secondary | ICD-10-CM | POA: Diagnosis not present

## 2019-04-19 DIAGNOSIS — C50412 Malignant neoplasm of upper-outer quadrant of left female breast: Secondary | ICD-10-CM

## 2019-04-19 DIAGNOSIS — Z9012 Acquired absence of left breast and nipple: Secondary | ICD-10-CM

## 2019-04-19 DIAGNOSIS — Z79810 Long term (current) use of selective estrogen receptor modulators (SERMs): Secondary | ICD-10-CM

## 2019-04-19 DIAGNOSIS — Z170 Estrogen receptor positive status [ER+]: Secondary | ICD-10-CM | POA: Insufficient documentation

## 2019-04-19 MED ORDER — LISINOPRIL 10 MG PO TABS: 10.0000 mg | ORAL_TABLET | Freq: Every day | ORAL | 0 refills | Status: DC

## 2019-04-19 MED ORDER — ALPRAZOLAM 0.5 MG PO TABS: 0.5000 mg | ORAL_TABLET | Freq: Two times a day (BID) | ORAL | 0 refills | Status: DC | PRN

## 2019-04-19 NOTE — Telephone Encounter (Signed)
Scheduled appt per 7/1 los. Spoke with patient and she is aware of her appt date and time

## 2019-05-03 ENCOUNTER — Inpatient Hospital Stay: Payer: Medicaid Other

## 2019-05-10 ENCOUNTER — Other Ambulatory Visit: Payer: Self-pay | Admitting: Hematology

## 2019-05-12 ENCOUNTER — Other Ambulatory Visit: Payer: Self-pay | Admitting: Nurse Practitioner

## 2019-05-12 DIAGNOSIS — C50412 Malignant neoplasm of upper-outer quadrant of left female breast: Secondary | ICD-10-CM

## 2019-05-17 ENCOUNTER — Telehealth: Payer: Self-pay

## 2019-05-17 ENCOUNTER — Other Ambulatory Visit: Payer: Self-pay

## 2019-05-17 DIAGNOSIS — C50412 Malignant neoplasm of upper-outer quadrant of left female breast: Secondary | ICD-10-CM

## 2019-05-17 DIAGNOSIS — Z170 Estrogen receptor positive status [ER+]: Secondary | ICD-10-CM

## 2019-05-17 MED ORDER — ALPRAZOLAM 0.5 MG PO TABS: 0.5000 mg | ORAL_TABLET | Freq: Two times a day (BID) | ORAL | 0 refills | Status: DC | PRN

## 2019-05-17 NOTE — Telephone Encounter (Signed)
Called in refill to [facility] for Xanax 0.5mg  #60 no refills.

## 2019-05-17 NOTE — Telephone Encounter (Signed)
-----   Message from Alla Feeling, NP sent at 05/15/2019  8:17 AM EDT ----- Regarding: RE: refill That's fine ----- Message ----- From: Loma Messing, LPN Sent: 05/29/9146  12:39 PM EDT To: Alla Feeling, NP Subject: refill                                         Hi Lacie, Pt would like a refill on her Xanax. Is it ok to refill?   Thanks, T

## 2019-06-07 ENCOUNTER — Telehealth: Payer: Self-pay | Admitting: Obstetrics and Gynecology

## 2019-06-07 NOTE — Telephone Encounter (Signed)
Spoke with patient about appointment change. She was at work, and stated she will call back for more information.

## 2019-06-14 ENCOUNTER — Ambulatory Visit: Payer: Medicaid Other | Admitting: Obstetrics and Gynecology

## 2019-07-04 ENCOUNTER — Other Ambulatory Visit: Payer: Self-pay | Admitting: Hematology

## 2019-07-04 DIAGNOSIS — C50412 Malignant neoplasm of upper-outer quadrant of left female breast: Secondary | ICD-10-CM

## 2019-07-05 NOTE — Telephone Encounter (Signed)
Refill request

## 2019-07-06 ENCOUNTER — Other Ambulatory Visit: Payer: Self-pay | Admitting: Nurse Practitioner

## 2019-07-06 DIAGNOSIS — Z170 Estrogen receptor positive status [ER+]: Secondary | ICD-10-CM

## 2019-07-06 DIAGNOSIS — C50412 Malignant neoplasm of upper-outer quadrant of left female breast: Secondary | ICD-10-CM

## 2019-07-06 MED ORDER — ALPRAZOLAM 0.5 MG PO TABS: 0.5000 mg | ORAL_TABLET | Freq: Two times a day (BID) | ORAL | 0 refills | Status: DC | PRN

## 2019-07-09 ENCOUNTER — Other Ambulatory Visit: Payer: Self-pay | Admitting: Nurse Practitioner

## 2019-07-09 DIAGNOSIS — Z170 Estrogen receptor positive status [ER+]: Secondary | ICD-10-CM

## 2019-07-09 DIAGNOSIS — C50412 Malignant neoplasm of upper-outer quadrant of left female breast: Secondary | ICD-10-CM

## 2019-07-11 ENCOUNTER — Telehealth: Payer: Self-pay | Admitting: Hematology

## 2019-07-11 NOTE — Telephone Encounter (Signed)
Returned patient's phone call regarding rescheduling 10/01 appointment, per patient's request appointment has been moved to 10/16.

## 2019-07-14 ENCOUNTER — Telehealth: Payer: Self-pay | Admitting: Obstetrics and Gynecology

## 2019-07-14 NOTE — Telephone Encounter (Signed)
Attempted to call patient about her appointment on 9/28 @ 9:35. No answer left voicemail instructing patient to wear a face mask for the entire appointment and no visitors are allowed during the visit. Patient instructed not to attend the appointment if she was any symptoms. Symptom list and office number left.

## 2019-07-17 ENCOUNTER — Ambulatory Visit: Payer: Medicaid Other | Admitting: Obstetrics and Gynecology

## 2019-07-20 ENCOUNTER — Other Ambulatory Visit: Payer: Medicaid Other

## 2019-07-20 ENCOUNTER — Ambulatory Visit: Payer: Medicaid Other | Admitting: Hematology

## 2019-07-25 ENCOUNTER — Other Ambulatory Visit: Payer: Self-pay | Admitting: Hematology

## 2019-07-25 DIAGNOSIS — Z1231 Encounter for screening mammogram for malignant neoplasm of breast: Secondary | ICD-10-CM

## 2019-07-31 NOTE — Progress Notes (Signed)
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 Findings are suspicious for Malignancy. ADDENDUM: Magnification views of the retroareolar right breast were performed to evaluate calcifications seen on the patient's baseline mammogram. There are loosely grouped and scattered calcifications in the outer right breast, superior to and directly posterior to the nipple. These calcifications are rounded and smudgy in the CC projection, and many of them demonstrate layering on the 90 degree lateral magnification view, consistent with benign milk of calcium. No suspicious microcalcifications are seen in the right breast.   01/31/2018 Initial Biopsy   Diagnosis 01/31/18 1. 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 - NEGATIVE FOR INVASIVE CARCINOMA. 2. Breast, left, needle  core biopsy, 2 o'clock, 10 cfn - LOW GRADE DUCTAL CARCINOMA IN SITU (DCIS) PARTIALLY INVOLVING AN INTRADUCTAL PAPILLOMA. - NEGATIVE FOR INVASIVE CARCINOMA.   01/31/2018 Receptors her2   Prognostic indicators significant for: ER, 100% positive and PR, 100% positive, both with strong staining intensity.    02/03/2018 Initial Diagnosis   Ductal carcinoma in situ (DCIS) of left breast   02/10/2018 Genetic Testing   Testing did not reveal a pathogenic mutation in any of the genes analyzed.A copy of the genetic test report will be scanned into Epic under the Media tab. The genes analyzed were the 23 genes on [facility]'s Breast/GYN panel (ATM, BARD1, BRCA1, BRCA2, BRIP1, CDH1, CHEK2, DICER1, EPCAM, MLH1,  MSH2, MSH6, NBN, NF1, PALB2, PMS2, PTEN, RAD50, RAD51C, RAD51D,SMARCA4, STK11, and TP53).  Genetic testing involved analysis of 9 genes: ATM, BRCA1, BRCA2, CDH1, CHEK2, PALB2, PTEN, STK11 and TP53 genes. Testing was normal and did not reveal a mutation in these genes.     03/18/2018 Surgery   TOTAL LEFT MASTECTOMY WITH AXILLARY SENTINEL LYMPH NODE BIOPSY and  LEFT BREAST RECONSTRUCTION WITH PLACEMENT OF TISSUE EXPANDER AND FLEX HD (ACELLULAR HYDRATED DERMIS) by Dr. HExcell Seltzerand Dr. [name] 03/18/18   03/18/2018 Pathology Results   Diagnosis 03/18/18 1. Lymph node, sentinel, biopsy, Left Axillary #1 - METASTATIC CARCINOMA IN ONE OF ONE LYMPH NODES (1/1). 2. Lymph node, sentinel, biopsy, Left Axillary #2 - ONE OF ONE LYMPH NODES NEGATIVE FOR CARCINOMA (0/1). 3. Breast, simple mastectomy, Left Total - MICROINVASIVE DUCTAL CARCINOMA ARISING IN A BACKGROUND OF LOW GRADE DUCTAL CARCINOMA IN SITU. - DUCTAL CARCINOMA IN SITU PARTIALLY INVOLVES AN INTRADUCTAL PAPILLOMA. - RESECTION MARGINS ARE NEGATIVE FOR INVASIVE CARCINOMA. - IN SITU CARCINOMA IS PRESENT AT THE ANTERIOR MARGIN BROADLY. -  BIOPSY SITE. - SEE ONCOLOGY TABLE.    03/18/2018 Receptors her2   Estrogen Receptor: 100%, POSITIVE,  MODERATE-WEAK STAINING INTENSITY Progesterone Receptor: 100%, POSITIVE, STRONG STAINING INTENSITY Proliferation Marker Ki67: 1% HER2 - NEGATIVE   03/18/2018 Cancer Staging   Staging form: Breast, AJCC 8th Edition - Pathologic stage from 03/18/2018: Stage IA (pT61m, pN1a, cM0, G1, ER+, PR+, HER2-) - Signed by [name] on 03/28/2018   05/18/2018 - 06/27/2018 Radiation Therapy   Adjuvant radiation by Dr. KSondra Come   06/27/2018 Mammogram   06/27/2018 Mammogram IMPRESSION: Benign appearing right breast calcifications.  Recommendation: Screening right mammogram is suggested in April 2020.   07/19/2018 -  Anti-estrogen oral therapy   Adjuvant tamoxifen     Survivorship   Per LCira Rue NP    03/07/2019 Surgery   REMOVAL OF LEFT TISSUE EXPANDER AND PLACEMENT OF SILICONE IMPLANT and LIPOFILLING FROM ABDOMEN TO LEFT CHEST and RIGHT BREAST MASTOPEXY by Dr. [name] 03/07/19      CURRENT THERAPY:  AdjuvantTamoxifen, started in early Oct 2019  INTERVAL HISTORY:  Casey HARKINSis here for a follow up of left breast cancer. She presents to the clinic alone. She notes she is doing well. She has still has drenching night sweats from hot flashes. She has been taking Xanax 0.519mat night and Gabapentin 30075mShe notes she is still not sleeping well. She notes she had not taken Effexor due to side effects. She notes her weight is increasing. She notes she has not had her period in almost 1 year. She notes having muscle cramps in her calves but otherwise she can tolerate Tamoxifen and wants to continue and rather not switch to AI in the future.     REVIEW OF SYSTEMS:   Constitutional: Denies fevers, chills or abnormal weight loss (+) weight gain (+) significant hot flashes and night sweats (+) trouble sleeping  Eyes: Denies blurriness of vision Ears, nose, mouth, throat, and face: Denies mucositis or sore throat Respiratory: Denies cough, dyspnea or wheezes Cardiovascular: Denies  palpitation, chest discomfort or lower extremity swelling Gastrointestinal:  Denies nausea, heartburn or change in bowel habits Skin: Denies abnormal skin rashes MSK: (+) Muscle cramps in calves Lymphatics: Denies new lymphadenopathy or easy bruising Neurological:Denies numbness, tingling or new weaknesses Behavioral/Psych: Mood is stable, no new changes  All other systems were reviewed with the patient and are negative.  MEDICAL HISTORY:  Past Medical History:  Diagnosis Date  . Anemia   . Anxiety   . 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 Family history of breast cancer   . Family history of ovarian cancer   . Genetic testing 02/21/2018   STAT Breast panel with reflex to Breast/GYN panel (23 genes) @ [facility] - No pathogenic mutations detected  . Hypertension   . PONV (postoperative nausea and vomiting)   . Uterine fibroid     SURGICAL HISTORY: Past Surgical History:  Procedure Laterality Date  . AXILLARY SURGERY    . BREAST RECONSTRUCTION WITH PLACEMENT OF TISSUE EXPANDER AND FLEX HD (ACELLULAR HYDRATED DERMIS) Left 03/18/2018   Procedure: LEFT BREAST RECONSTRUCTION WITH PLACEMENT OF TISSUE EXPANDER AND FLEX HD (ACELLULAR HYDRATED DERMIS);  Surgeon: ThiIrene LimboD;  [facility]  Laterality: Left;  . DENTAL SURGERY N/A 07/08/2018  . KNEE ARTHROSCOPY Left 2013  . LIPOSUCTION WITH LIPOFILLING Left 03/07/2019   Procedure: LIPOFILLING FROM ABDOMEN TO LEFT CHEST;  Surgeon: ThiIrene LimboD;  Location:  [place];  Service: Plastics;  Laterality: Left;  Marland Kitchen MASTECTOMY W/ SENTINEL NODE BIOPSY Left 03/18/2018   Procedure: TOTAL LEFT MASTECTOMY WITH AXILLARY SENTINEL LYMPH NODE BIOPSY;  Surgeon: Excell Seltzer, MD;  Location: [place];  Service: General;  Laterality: Left;  Marland Kitchen MASTOPEXY Right 03/07/2019   Procedure: RIGHT BREAST MASTOPEXY;  Surgeon: [name], MD;  Location:  [place];  Service: Plastics;  Laterality: Right;  . REMOVAL OF TISSUE EXPANDER AND PLACEMENT OF IMPLANT Left 03/07/2019   Procedure: REMOVAL OF LEFT TISSUE EXPANDER AND PLACEMENT OF SILICONE IMPLANT;  Surgeon: [name], MD;  Location: [place];  Service: Plastics;  Laterality: Left;  . TOOTH EXTRACTION N/A 07/08/2018   Procedure: DENTAL RESTORATION/EXTRACTIONS;  Surgeon: Diona Browner, DDS;  Location: [place];  Service: Oral Surgery;  Laterality: N/A;  . TUBAL LIGATION      I have reviewed the social history and family history with the patient and they are unchanged from previous note.  ALLERGIES:  has No Known Allergies.  MEDICATIONS:  Current Outpatient Medications  Medication Sig Dispense Refill  . ALPRAZolam (XANAX) 0.5 MG tablet Take 1 tablet (0.5 mg total) by mouth 2 (two) times daily as needed for anxiety or sleep. 60 tablet 0  . cyclobenzaprine (FLEXERIL) 10 MG tablet Take 1 tablet (10 mg total) by mouth 3 (three) times daily as needed for muscle spasms. 30 tablet 0  . gabapentin (NEURONTIN) 300 MG capsule Take 1 capsule (300 mg total) by mouth 2 (two) times daily. 60 capsule 2  . lisinopril (ZESTRIL) 10 MG tablet Take 1 tablet (10 mg total) by mouth daily. 90 tablet 0  . tamoxifen (NOLVADEX) 20 MG tablet Take 1 tablet (20 mg total) by mouth daily. 30 tablet 2  . venlafaxine XR (EFFEXOR-XR) 37.5 MG 24 hr capsule Take 1 capsule (37.5 mg total) by mouth daily with breakfast. 30 capsule 1   No current facility-administered medications for this visit.     PHYSICAL EXAMINATION: ECOG PERFORMANCE STATUS: 0 - Asymptomatic  Vitals:   08/04/19 0823  BP: (!) 128/96  Pulse: (!) 102  Resp: 18  Temp: 98.3 F (36.8 C)  SpO2: 100%   Filed Weights   08/04/19 0823  Weight: 185 lb 4.8 oz (84.1 kg)    GENERAL:alert, no distress and comfortable SKIN: skin color, texture, turgor are normal, no rashes or significant lesions EYES: normal,  Conjunctiva are pink and non-injected, sclera clear  NECK: supple, thyroid normal size, non-tender, without nodularity LYMPH:  no palpable lymphadenopathy in the cervical, axillary  LUNGS: clear to auscultation and percussion with normal breathing effort HEART: regular rate & rhythm and no murmurs and no lower extremity edema ABDOMEN:abdomen soft, non-tender and normal bowel sounds Musculoskeletal:no cyanosis of digits and no clubbing  NEURO: alert & oriented x 3 with fluent speech, no focal motor/sensory deficits BREAST: S/p left mastectomy, b/l reconstruction with left implant: Surgical incision healed well with scar tissue. No palpable mass, nodules or adenopathy bilaterally. Breast exam benign.   LABORATORY DATA:  I have reviewed the data as listed CBC Latest Ref Rng & Units 08/04/2019 02/17/2019 09/26/2018  WBC 4.0 - 10.5 K/uL 3.5(L) 2.8(L) 2.4(L)  Hemoglobin 12.0 - 15.0 g/dL 13.8 13.5 13.6  Hematocrit 36.0 - 46.0 % 41.2 41.8 44.0  Platelets 150 - 400 K/uL 234 220 230     CMP Latest Ref Rng & Units 08/04/2019 02/17/2019 09/26/2018  Glucose 70 - 99 mg/dL 101(H) 91 83  BUN 6 - 20 mg/dL _0 Creatinine 0.44 - 1.00 mg/dL 0.86 0.80 0.85  Sodium 135 - 145 mmol/L 141 143 144  Potassium 3.5 - 5.1 mmol/L 4.3 4.1 4.3  Chloride 98 - 111 mmol/L 105 108 107  CO2 22 - 32 mmol/L _1 Calcium 8.9 - 10.3 mg/dL 8.9 8.7(L) 9.4  Total Protein 6.5 - 8.1 g/dL 7.3 7.1 7.6  Total Bilirubin 0.3 - 1.2 mg/dL 0.4 0.5 0.6  Alkaline Phos 38 - 126 U/L 62 55 61  AST 15 - 41 U/L 16 13(L) 13(L)  ALT 0 - 44 U/L _2 RADIOGRAPHIC STUDIES: I have personally reviewed the radiological images as listed and agreed with the findings in the report. No results found.   ASSESSMENT & PLAN:  ALAYASIA BREEDING is a 48 y.o. female with   1. 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 Treated with left total mastectomy and breast reconstruction and radiation. Currently onadjuvantTamoxifen. Tolerating moderately well with significant hot flashes, occasional muscle cramps and weight gain.  -We discussed weight management, starting OTC calcium and switching Xanax to Effexor and increasing Gabapentin to control her hot flashes.  -Plan for tamoxifen 10 years. We discussed thatIf she becomes postmenopausal in the next few years, we will probably change to aromatase inhibitor for additional 5 years. However she wants to stay on Tamoxifen and does not want to try AI  -Her last period was almost 1 year ago.  -She is clinically doing well. Lab reviewed, her CBC and CMP are within normal limits except WBC 3.5, ANC 1.4. Her physical exam and her 08/2018 mammogram were unremarkable. There is no clinical concern for recurrence. -Continue surveillance. Next mammogram on 09/18/19 -Continue Tamoxifen -Virtual visit in 4 months and OV in 7 months    2.Hot Flashes, depression and anxiety, insomnia -These symptoms were noted before starting Tamoxifen. -She has tried gabapentin, Melatonin, and Ambien but without much relief.  -Her concerns with COVID-19 pandemic have exacerbated her anxiety  -I previously called in Effexor 37.5 mg daily for her, she tried 2 weeks and stopped due to lack of benefit. -I previously offered her the chance to speak with our counselor or someone. She has declined for now.  She denied suicidal ideas -She has vaginal dryness, no discharge. I advised her not to use estrogen containing creams as this is contraindicated with her Tamoxifen. I recommend vaginal lubrications as needed.  -She is on Xanax 0.65m nightly for her anxiety. She feels her anxiety has much improved.  -She has been taking Gabapentin 3036mat night but night sweats still drenching.  -I discussed she can increase Gabapentin up to 60066mr 900m55mt night. I also discussed we can switch Xanax to Effexor which can help both her hot flashes and her anxiety. She is willing to try. I discussed this can take up to 1-2 months to take effect and  we can titrate up if needed.(08/04/19).  -I discussed with anxiety controlled she should be able to sleep better.    3. HTN,poorly controlled -She is currently on Lisinopril -I advised her to monitor her BP at home and informed her that if her BP remains high, she needs to f/u with PCP -BP is 128/96 today (08/04/19)  4.Mild neutropenia -She has developed mild leukopenia,  in the past several months, previously white count was normal. -I reviewed COVID-19 precautions.  -WBC 3.5, ANC 1.4 today (08/04/19) -Continue monitoring.  5. Weight gain  -The patient is concerned with her weight trending up -I discussed Tamoxifen can lead to weight gain.  -I reviewed reducing sugar, carbohydrates in diet and eating more vegetable and exercising more.  -I discussed healthy weight and management, will refer her   Plan -I refilled Gabapentin and Effexor today, will wean off Xanax in next month when she start Effexor -Continue Tamoxifen  -Virtual visit in 4 months  -Lab and f/u in 7 months  -referral to [facility] for weight loss    No problem-specific Assessment & Plan notes found for this encounter.   No orders of the defined types were placed in this encounter.  All questions were answered. The patient knows to call the clinic with any problems, questions or concerns. No barriers to learning was detected. I spent 20 minutes counseling the patient face to face. The total time spent in the appointment was 25 minutes and more than 50% was on counseling and review of test results     Truitt Merle, MD 08/04/2019   I, Joslyn Devon, am acting as scribe for Truitt Merle, MD.   I have reviewed the above documentation for accuracy and completeness, and I agree with the above.

## 2019-08-04 ENCOUNTER — Inpatient Hospital Stay: Payer: Medicaid Other | Attending: Nurse Practitioner

## 2019-08-04 ENCOUNTER — Encounter: Payer: Self-pay | Admitting: Hematology

## 2019-08-04 ENCOUNTER — Other Ambulatory Visit: Payer: Self-pay

## 2019-08-04 ENCOUNTER — Inpatient Hospital Stay (HOSPITAL_BASED_OUTPATIENT_CLINIC_OR_DEPARTMENT_OTHER): Payer: Medicaid Other | Admitting: Hematology

## 2019-08-04 VITALS — BP 128/96 | HR 102 | Temp 98.3°F | Resp 18 | Ht 67.0 in | Wt 185.3 lb

## 2019-08-04 DIAGNOSIS — D709 Neutropenia, unspecified: Secondary | ICD-10-CM | POA: Insufficient documentation

## 2019-08-04 DIAGNOSIS — D0512 Intraductal carcinoma in situ of left breast: Secondary | ICD-10-CM | POA: Insufficient documentation

## 2019-08-04 DIAGNOSIS — N951 Menopausal and female climacteric states: Secondary | ICD-10-CM | POA: Insufficient documentation

## 2019-08-04 DIAGNOSIS — R635 Abnormal weight gain: Secondary | ICD-10-CM | POA: Diagnosis not present

## 2019-08-04 DIAGNOSIS — C50412 Malignant neoplasm of upper-outer quadrant of left female breast: Secondary | ICD-10-CM

## 2019-08-04 DIAGNOSIS — Z170 Estrogen receptor positive status [ER+]: Secondary | ICD-10-CM

## 2019-08-04 DIAGNOSIS — Z79810 Long term (current) use of selective estrogen receptor modulators (SERMs): Secondary | ICD-10-CM | POA: Insufficient documentation

## 2019-08-04 DIAGNOSIS — G4700 Insomnia, unspecified: Secondary | ICD-10-CM | POA: Diagnosis not present

## 2019-08-04 DIAGNOSIS — F329 Major depressive disorder, single episode, unspecified: Secondary | ICD-10-CM | POA: Insufficient documentation

## 2019-08-04 DIAGNOSIS — I10 Essential (primary) hypertension: Secondary | ICD-10-CM | POA: Insufficient documentation

## 2019-08-04 DIAGNOSIS — F419 Anxiety disorder, unspecified: Secondary | ICD-10-CM | POA: Diagnosis not present

## 2019-08-04 LAB — CMP (CANCER CENTER ONLY)
ALT: 11 U/L (ref 0–44)
AST: 16 U/L (ref 15–41)
Albumin: 3.8 g/dL (ref 3.5–5.0)
Alkaline Phosphatase: 62 U/L (ref 38–126)
Anion gap: 12 (ref 5–15)
BUN: 10 mg/dL (ref 6–20)
CO2: 24 mmol/L (ref 22–32)
Calcium: 8.9 mg/dL (ref 8.9–10.3)
Chloride: 105 mmol/L (ref 98–111)
Creatinine: 0.86 mg/dL (ref 0.44–1.00)
GFR, Est AFR Am: >60 mL/min (ref >60)
GFR, Estimated: >60 mL/min (ref >60)
Glucose, Bld: 101 mg/dL — ABNORMAL HIGH (ref 70–99)
Potassium: 4.3 mmol/L (ref 3.5–5.1)
Sodium: 141 mmol/L (ref 135–145)
Total Bilirubin: 0.4 mg/dL (ref 0.3–1.2)
Total Protein: 7.3 g/dL (ref 6.5–8.1)

## 2019-08-04 LAB — CBC WITH DIFFERENTIAL (CANCER CENTER ONLY)
Abs Immature Granulocytes: 0.01 10*3/uL (ref 0.00–0.07)
Basophils Absolute: 0 10*3/uL (ref 0.0–0.1)
Basophils Relative: 1 %
Eosinophils Absolute: 0.1 10*3/uL (ref 0.0–0.5)
Eosinophils Relative: 2 %
HCT: 41.2 % (ref 36.0–46.0)
Hemoglobin: 13.8 g/dL (ref 12.0–15.0)
Immature Granulocytes: 0 %
Lymphocytes Relative: 43 %
Lymphs Abs: 1.5 10*3/uL (ref 0.7–4.0)
MCH: 28.5 pg (ref 26.0–34.0)
MCHC: 33.5 g/dL (ref 30.0–36.0)
MCV: 84.9 fL (ref 80.0–100.0)
Monocytes Absolute: 0.5 10*3/uL (ref 0.1–1.0)
Monocytes Relative: 15 %
Neutro Abs: 1.4 10*3/uL — ABNORMAL LOW (ref 1.7–7.7)
Neutrophils Relative %: 39 %
Platelet Count: 234 10*3/uL (ref 150–400)
RBC: 4.85 MIL/uL (ref 3.87–5.11)
RDW: 13.7 % (ref 11.5–15.5)
WBC Count: 3.5 10*3/uL — ABNORMAL LOW (ref 4.0–10.5)
nRBC: 0 % (ref 0.0–0.2)

## 2019-08-04 MED ORDER — TAMOXIFEN CITRATE 20 MG PO TABS: 20.0000 mg | ORAL_TABLET | Freq: Every day | ORAL | 2 refills | Status: DC

## 2019-08-04 MED ORDER — ALPRAZOLAM 0.5 MG PO TABS: 0.5000 mg | ORAL_TABLET | Freq: Two times a day (BID) | ORAL | 0 refills | Status: DC | PRN

## 2019-08-04 MED ORDER — VENLAFAXINE HCL ER 37.5 MG PO CP24: 37.5000 mg | ORAL_CAPSULE | Freq: Every day | ORAL | 1 refills | Status: DC

## 2019-08-04 MED ORDER — GABAPENTIN 300 MG PO CAPS: 300.0000 mg | ORAL_CAPSULE | Freq: Two times a day (BID) | ORAL | 2 refills | Status: DC

## 2019-08-07 ENCOUNTER — Telehealth: Payer: Self-pay | Admitting: Hematology

## 2019-08-07 NOTE — Telephone Encounter (Signed)
Scheduled appt per 10/16 los.  Spoke with pt and she is aware of the appt date and time.  Per the Referral to weight management clinic they will authorize and contact the patient about an appt.

## 2019-09-04 ENCOUNTER — Telehealth: Payer: Self-pay | Admitting: Obstetrics and Gynecology

## 2019-09-04 NOTE — Telephone Encounter (Signed)
Spoke to patient about her appointment on 11/17 @ 8:15. Patient instructed to wear a face mask for the entire appointment and no visitors are allowed with her during the visit. Patient screened for covid symptoms and denied having any.

## 2019-09-05 ENCOUNTER — Other Ambulatory Visit (HOSPITAL_COMMUNITY)
Admission: RE | Admit: 2019-09-05 | Discharge: 2019-09-05 | Disposition: A | Discharge status: Home / Self Care | Payer: Medicaid Other | Source: Ambulatory Visit | Attending: Obstetrics and Gynecology | Admitting: Obstetrics and Gynecology

## 2019-09-05 ENCOUNTER — Other Ambulatory Visit: Payer: Self-pay

## 2019-09-05 ENCOUNTER — Encounter: Payer: Self-pay | Admitting: Obstetrics and Gynecology

## 2019-09-05 ENCOUNTER — Ambulatory Visit (INDEPENDENT_AMBULATORY_CARE_PROVIDER_SITE_OTHER): Payer: Medicaid Other | Admitting: Obstetrics and Gynecology

## 2019-09-05 VITALS — BP 136/79 | HR 156 | Wt 186.8 lb

## 2019-09-05 DIAGNOSIS — N939 Abnormal uterine and vaginal bleeding, unspecified: Secondary | ICD-10-CM | POA: Insufficient documentation

## 2019-09-05 DIAGNOSIS — Z79810 Long term (current) use of selective estrogen receptor modulators (SERMs): Secondary | ICD-10-CM | POA: Insufficient documentation

## 2019-09-05 DIAGNOSIS — F418 Other specified anxiety disorders: Secondary | ICD-10-CM

## 2019-09-05 DIAGNOSIS — Z0000 Encounter for general adult medical examination without abnormal findings: Secondary | ICD-10-CM

## 2019-09-05 NOTE — Procedures (Signed)
Endometrial Biopsy Procedure Note  Pre-operative Diagnosis: AUB. On tamoxifen. H/o breast cancer   Post-operative Diagnosis: same  Procedure Details  The risks (including infection, bleeding, pain, and uterine perforation) and benefits of the procedure were explained to the patient and Written informed consent was obtained.  The patient was placed in the dorsal lithotomy position.  Bimanual exam showed the uterus to be in the anteroflexed position.  A Graves' speculum inserted in the vagina, and the cervix was visualized and a pap smear performed. The cervix was then prepped with povidone iodine, and a sharp tenaculum was applied to the anterior lip of the cervix for stabilization.  A pipelle was inserted into the uterine cavity and sounded the uterus to a depth of 12cm.  A large amount of blood and no obvious tissue was seen after two passes. The sample was sent for pathologic examination.  Condition: Stable  Complications: None  Plan: The patient was advised to call for any fever or for prolonged or severe pain or bleeding. She was advised to use OTC analgesics as needed for mild to moderate pain. She was advised to avoid vaginal intercourse for 48 hours or until the bleeding has completely stopped.  Durene Romans MD Attending Center for [employer])

## 2019-09-05 NOTE — Progress Notes (Signed)
Obstetrics and Gynecology Annual Patient Evaluation  Appointment Date: 09/05/2019  OBGYN Clinic: [facility]  Primary Care Provider: Patient, No Pcp Per  Chief Complaint:  Chief Complaint  Patient presents with  . Gynecologic Exam    History of Present Illness: Casey Baker is a 48 y.o. African-American [phone], seen for the above chief complaint. Her past medical history is significant for er/pr+ breast cancer s/p surgery and radiation (currently on tamoxifen therapy), BTL, fibroids.   Patient states she had a period since for almost a year but started to have some brown/dark discharge this past month and she started having sexual intercourse again after that and had some spotting a few days ago.   She was last seen by me in July 2019 and had spotting for the past two months and hot flashes and night sweats; she was not on tamoxifen therapy at that time. Embx was negative. Still having some hot flashes and night sweats s/s.    No blood in stool or urine, no chest pain, sob, abdominal pain, vaginal discharge, vaginal itching, current bleeding.   Review of Systems: as noted in the History of Present Illness.  Patient Active Problem List   Diagnosis Date Noted  . Breast cancer, left breast ([place]) 03/18/2018  . Genetic testing 02/21/2018  . Family history of breast cancer   . Family history of ovarian cancer   . Iron deficiency anemia due to chronic blood loss 02/09/2018  . Malignant neoplasm of upper-outer quadrant of left female breast ([place]) 02/03/2018  . Breast wound, left, subsequent encounter 01/12/2018  . Hypertension   . Anxiety with depression   . Uterine fibroid 09/29/2014  . Menorrhagia 09/29/2014  . Anemia 09/29/2014  . Sinus tachycardia     Past Medical History:  Past Medical History:  Diagnosis Date  . Anemia   . Anxiety   . 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 Family history of breast cancer   . Family  history of ovarian cancer   . Genetic testing 02/21/2018   STAT Breast panel with reflex to Breast/GYN panel (23 genes) @ [facility] - No pathogenic mutations detected  . Hypertension   . PONV (postoperative nausea and vomiting)   . Uterine fibroid     Past Surgical History:  Past Surgical History:  Procedure Laterality Date  . AXILLARY SURGERY    . BREAST RECONSTRUCTION WITH PLACEMENT OF TISSUE EXPANDER AND FLEX HD (ACELLULAR HYDRATED DERMIS) Left 03/18/2018   Procedure: LEFT BREAST RECONSTRUCTION WITH PLACEMENT OF TISSUE EXPANDER AND FLEX HD (ACELLULAR HYDRATED DERMIS);  Surgeon: [name], MD;  Location: [place];  Service: Plastics;  Laterality: Left;  . DENTAL SURGERY N/A 07/08/2018  . KNEE ARTHROSCOPY Left 2013  . LIPOSUCTION WITH LIPOFILLING Left 03/07/2019   Procedure: LIPOFILLING FROM ABDOMEN TO LEFT CHEST;  Surgeon: [name], MD;  Location: [place];  Service: Plastics;  Laterality: Left;  Marland Kitchen MASTECTOMY W/ SENTINEL NODE BIOPSY Left 03/18/2018   Procedure: TOTAL LEFT MASTECTOMY WITH AXILLARY SENTINEL LYMPH NODE BIOPSY;  Surgeon: Excell Seltzer, MD;  Location: [place];  Service: General;  Laterality: Left;  Marland Kitchen MASTOPEXY Right 03/07/2019   Procedure: RIGHT BREAST MASTOPEXY;  Surgeon: [name], MD;  Location: [place];  Service: Plastics;  Laterality: Right;  . REMOVAL OF TISSUE EXPANDER AND PLACEMENT OF IMPLANT Left 03/07/2019   Procedure: REMOVAL OF LEFT TISSUE EXPANDER AND PLACEMENT OF SILICONE IMPLANT;  Surgeon: Thimmappa,  Arnoldo Hooker, MD;  Location: [place];  Service: Plastics;  Laterality: Left;  . TOOTH EXTRACTION N/A 07/08/2018   Procedure: DENTAL RESTORATION/EXTRACTIONS;  Surgeon: Diona Browner, DDS;  Location: [place];  Service: Oral Surgery;  Laterality: N/A;  . TUBAL LIGATION      Past Obstetrical History:  OB History  Gravida Para Term Preterm AB Living  6       3 3    SAB TAB Ectopic Multiple Live Births  2 1     3     # Outcome Date GA Lbr Len/2nd Weight Sex Delivery Anes PTL Lv  6 Gravida           5 Gravida           4 Gravida           3 SAB           2 SAB           1 TAB             Obstetric Comments  SVD x 3    Past Gynecological History: As per HPI. History of Pap Smear(s): Yes.   Last pap 2019, which was neg and hpv neg  Social History:  Social History   Socioeconomic History  . Marital status: Single    Spouse name: Not on file  . Number of children: Not on file  . Years of education: Not on file  . Highest education level: Not on file  Occupational History  . Not on file  Social Needs  . Financial resource strain: Not on file  . Food insecurity    Worry: Not on file    Inability: Not on file  . Transportation needs    Medical: No    Non-medical: No  Tobacco Use  . Smoking status: Never Smoker  . Smokeless tobacco: Never Used  Substance and Sexual Activity  . Alcohol use: No  . Drug use: No  . Sexual activity: Yes  Lifestyle  . Physical activity    Days per week: 2 days    Minutes per session: 50 min  . Stress: Very much  Relationships  . Social connections    Talks on phone: More than three times a week    Gets together: More than three times a week    Attends religious service: More than 4 times per year    Active member of club or organization: No    Attends meetings of clubs or organizations: Never    Relationship status: Never married  . Intimate partner violence    Fear of current or ex partner: No    Emotionally abused: No    Physically abused: No    Forced sexual activity: No  Other Topics Concern  . Not on file  Social History Narrative  . Not on file    Family History:  Family History  Problem Relation Age of Onset  . Hypertension Mother   . Ovarian cancer Maternal Grandmother        dx 22s; deceased 2s. She declined therapy of any kind including surgery and chemo  . Breast cancer  Cousin        dx 27s; currently 40s; daughter of matenral uncle  . Leukemia Maternal Aunt        currently 16    Medications [name] had no medications administered during this visit. Current Outpatient Medications  Medication Sig Dispense Refill  . ALPRAZolam (XANAX) 0.5 MG tablet  Take 1 tablet (0.5 mg total) by mouth 2 (two) times daily as needed for anxiety or sleep. 60 tablet 0  . cyclobenzaprine (FLEXERIL) 10 MG tablet Take 1 tablet (10 mg total) by mouth 3 (three) times daily as needed for muscle spasms. 30 tablet 0  . gabapentin (NEURONTIN) 300 MG capsule Take 1 capsule (300 mg total) by mouth 2 (two) times daily. 60 capsule 2  . lisinopril (ZESTRIL) 10 MG tablet Take 1 tablet (10 mg total) by mouth daily. 90 tablet 0  . tamoxifen (NOLVADEX) 20 MG tablet Take 1 tablet (20 mg total) by mouth daily. 30 tablet 2  . venlafaxine XR (EFFEXOR-XR) 37.5 MG 24 hr capsule Take 1 capsule (37.5 mg total) by mouth daily with breakfast. (Patient not taking: Reported on 09/05/2019) 30 capsule 1   No current facility-administered medications for this visit.     Allergies Patient has no known allergies.   Physical Exam:  BP 136/79   Pulse (!) 156   Wt 186 lb 12.8 oz (84.7 kg)   LMP  (LMP Unknown) Comment: pt reports last period 11 months ago  BMI 29.26 kg/m  Body mass index is 29.26 kg/m. General appearance: Well nourished, well developed female in no acute distress.  Neck:  Supple, normal appearance, and no thyromegaly  Cardiovascular: normal s1 and s2.  No murmurs, rubs or gallops. Respiratory:  Clear to auscultation bilateral. Normal respiratory effort Abdomen: positive bowel sounds and no masses, hernias; diffusely non tender to palpation, non distended Neuro/Psych:  Normal mood and affect.  Skin:  Warm and dry.  Lymphatic:  No inguinal lymphadenopathy.   Pelvic exam: is not limited by body habitus EGBUS: within normal limits Vagina: mild amount of old, brown discharge  in the vault, Cervix: normal appearing cervix without tenderness, discharge or lesions. Uterus:  enlarged, c/w 14-16 week size and non tender and Adnexa:  normal adnexa and no mass, fullness, tenderness Rectovaginal: deferred  Laboratory: none  Radiology: CLINICAL DATA:  Abnormal uterine bleeding, perimenopausal symptoms, fibroids  EXAM: TRANSABDOMINAL AND TRANSVAGINAL ULTRASOUND OF PELVIS  TECHNIQUE: Both transabdominal and transvaginal ultrasound examinations of the pelvis were performed. Transabdominal technique was performed for global imaging of the pelvis including uterus, ovaries, adnexal regions, and pelvic cul-de-sac. It was necessary to proceed with endovaginal exam following the transabdominal exam to visualize the endometrium and ovaries.  COMPARISON:  CT abdomen and pelvis 11/15/2014, pelvic ultrasound 12/18/2009  FINDINGS: Uterus  Measurements: 17.5 x 15.7 x 18.4 cm. Enlarged nodular uterus containing multiple masses consistent with leiomyomata. Notable lesions include a large posterior LEFT intramural leiomyoma at the mid uterus 8.1 x 6.5 x 7.0 cm, an anterior intramural leiomyoma at the upper uterine segment 5.5 x 3.8 x 5.3 cm, and a posterior RIGHT intramural leiomyoma 5.6 x 3.7 x 4.0 cm. Numerous additional leiomyomata are seen.  Endometrium  Inadequately delineated, likely obscured/distorted by the numerous leiomyomata visualized  Right ovary  Nonvisualized likely due to a combination of displacement by enlarged uterus and obscuration by bowel  Left ovary  Nonvisualized likely due to a combination of displacement by enlarged uterus and obscuration by bowel  Other findings  No free pelvic fluid.  No adnexal masses.  IMPRESSION: Enlarged nodular uterus containing numerous leiomyomata as above.  Nonvisualization of endometrial complex and ovaries.   Electronically Signed   By: Lavonia Dana M.D.   On: 04/29/2018  09:20  Assessment: pt stable  Plan:  1. Abnormal uterine bleeding (AUB) Follow up pap and embx. Seen  by gyn onc dr Gerarda Fraction July 2019 for consult re: discussion with patient re: new breast cancer dx, aub and pt had questions re: need for hyst with her genetic history. Consult note at that times states potentially offering hyst vs d&c if had repeat aub. Will follow up results and if comes back concerning for malignancy then d/w her that would offer hyst and she would also like bso. If benign, I told her I would d/w gyn onc re: doing hysteroscopy, d&c vs just proceeding with hyst. Pt also has large fibroid uterus and I told her that gyn onc would like have higher chance of being able to do it laparoscopically and avoiding an open procedure.  - Cytology - PAP( [place]) - Surgical pathology( [place]/ [facility])  2. Care related to current tamoxifen use See above. Pt states plan is for 10 years of tamoxifen use.  - Cytology - PAP( [place]) - Surgical pathology( [place]/ [facility])  RTC PRN. Based on path results.   Durene Romans MD Attending [facility] [employer] [facility])

## 2019-09-06 LAB — CYTOLOGY - PAP
Chlamydia: NEGATIVE
Comment: NEGATIVE
Comment: NEGATIVE
Comment: NEGATIVE
Comment: NORMAL
Diagnosis: NEGATIVE
High risk HPV: NEGATIVE
Neisseria Gonorrhea: NEGATIVE
Trichomonas: NEGATIVE

## 2019-09-06 LAB — SURGICAL PATHOLOGY

## 2019-09-07 ENCOUNTER — Telehealth: Payer: Self-pay

## 2019-09-07 ENCOUNTER — Telehealth: Payer: Self-pay | Admitting: Family Medicine

## 2019-09-07 NOTE — Telephone Encounter (Signed)
Yes, continue Tamoxifen unless her endometrial biopsy was abnormal. Thanks   Truitt Merle MD

## 2019-09-07 NOTE — Telephone Encounter (Signed)
Spoke with patient to let her know per Dr. Burr Medico to continue the Tamoxifen.  She verbalized an understanding.

## 2019-09-07 NOTE — Telephone Encounter (Signed)
Patient calls stating that she went 11 months without vaginal bleeding, then started having bleeding this month.  He has had an endometrial biopsy done and is waiting on the results.  She is wondering if she should continue the Tamoxifen at this point?  [phone]

## 2019-09-07 NOTE — Telephone Encounter (Signed)
Called patient and informed her of negative results. Patient verbalized understanding & asked what the steps were. Told patient Dr Ilda Basset would be in touch about plan of care and likely hasn't had a chance to review results yet since they just came back. Patient verbalized understanding & had no questions.

## 2019-09-07 NOTE — Telephone Encounter (Signed)
Requesting a call back to get her results.

## 2019-09-08 ENCOUNTER — Telehealth: Payer: Self-pay | Admitting: Obstetrics and Gynecology

## 2019-09-08 NOTE — Telephone Encounter (Signed)
I called her back and answered her questions. Her gyn has referred her back to GYN onc, I will also reach out to them to see if they plan to do any workup.   Truitt Merle MD

## 2019-09-08 NOTE — Telephone Encounter (Signed)
GYN Telephone Note Patient called and d/w her re: the results. I told her pap is neg but embx too bloody to interpret. I recommend her seeing gyn onc again to go over different options. Pt just having some d/c and spotting with wiping. Pt amenable to plan  Durene Romans MD Attending [facility] 09/08/2019 Time: 1103am

## 2019-09-11 ENCOUNTER — Telehealth: Payer: Self-pay

## 2019-09-11 NOTE — Telephone Encounter (Signed)
Per Dr. Burr Medico I spoke with patient and informed her that Dr. Burr Medico had spoken with Dr. Ilda Basset and she feels it is safe for her to take Tamoxifen.  The patient agrees and also states the very light vaginal bleeding she was having stopped on Saturday.

## 2019-09-12 ENCOUNTER — Other Ambulatory Visit: Payer: Self-pay

## 2019-09-12 ENCOUNTER — Ambulatory Visit (INDEPENDENT_AMBULATORY_CARE_PROVIDER_SITE_OTHER): Payer: Medicaid Other | Admitting: Clinical

## 2019-09-12 DIAGNOSIS — F418 Other specified anxiety disorders: Secondary | ICD-10-CM | POA: Diagnosis not present

## 2019-09-12 NOTE — BH Specialist Note (Signed)
Integrated Behavioral Health via Telemedicine Video Visit  09/12/2019 ZAYDAH SANSING [id number]  Number of [place] visits: 2 Session Start time: 9:23 Session End time: 10:09 Total time: 39  Referring Provider: Aletha Halim, MD Type of Visit: Video Patient/Family location: [facility] Provider location: [facility] All persons participating in visit: Patient Hannaha Wiess and [place]    Confirmed patient's address: Yes  Confirmed patient's phone number: Yes  Any changes to demographics: No   Confirmed patient's insurance: Yes  Any changes to patient's insurance: No   Discussed confidentiality: Yes     I connected with Feliberto Harts  by a video enabled telemedicine application and verified that I am speaking with the correct person using two identifiers.     I discussed the limitations of evaluation and management by telemedicine and the availability of in person appointments.  I discussed that the purpose of this visit is to provide behavioral health care while limiting exposure to the novel coronavirus.   Discussed there is a possibility of technology failure and discussed alternative modes of communication if that failure occurs.  I discussed that engaging in this video visit, they consent to the provision of behavioral healthcare and the services will be billed under their insurance.  Patient and/or legal guardian expressed understanding and consented to video visit: Yes   PRESENTING CONCERNS: Patient and/or family reports the following symptoms/concerns: Pt states her primary concern today is worry during Covid19 pandemic, along with fear of breast cancer returning (diagnosed in April 2019; had mastectomy on one side). Pt says she has been sleeping 2-4 hours every night, and that worry keeps her up at night. Pt takes Xanax and Melatonin, which helps, and makes beautiful handmade journals (that she doesn't use) and is very open to learning  self-coping strategy to gain some control over the worry.  Duration of problem: Increase in past year; Severity of problem: severe  STRENGTHS (Protective Factors/Coping Skills): Open to treatment   GOALS ADDRESSED: Patient will: 1.  Reduce symptoms of: anxiety and insomnia  2.  Increase knowledge and/or ability of: coping skills and healthy habits  3.  Demonstrate ability to: Increase healthy adjustment to current life circumstances and Increase motivation to adhere to plan of care  INTERVENTIONS: Interventions utilized:  Mindfulness or Relaxation Training, Brief CBT and Psychoeducation and/or Health Education Standardized Assessments completed: GAD-7 and PHQ 9  ASSESSMENT: Patient currently experiencing Anxiety disorder, unspecified.   Patient may benefit from psychoeducation and brief therapeutic interventions regarding coping with symptoms of anxiety .  PLAN: 1. Follow up with behavioral health clinician on : Two weeks 2. Behavioral recommendations:  -Pick out 2 favorite homemade journals, and begin to use daily: 1- Worry Time journal strategy; 2-Gratitude journal.  -CALM relaxation breathing exercise twice daily (morning; at bedtime) -Continue taking medication as prescribed by medical provider 3. Referral(s): [place] (In Clinic)  I discussed the assessment and treatment plan with the patient and/or parent/guardian. They were provided an opportunity to ask questions and all were answered. They agreed with the plan and demonstrated an understanding of the instructions.   They were advised to call back or seek an in-person evaluation if the symptoms worsen or if the condition fails to improve as anticipated.  Caroleen Hamman McMannes  Depression screen [facility] 2/9 09/05/2019 04/25/2018 04/20/2018 03/07/2018 01/12/2018  Decreased Interest 0 1 0 1 3  Down, Depressed, Hopeless 0 2 1 1 1   PHQ - 2 Score 0 3 1 2  4  Altered sleeping 0 3 3 3 3   Tired, decreased energy 0  1 2 0 3  Change in appetite 0 0 0 0 0  Feeling bad or failure about yourself  0 - 2 0 0  Trouble concentrating 0 - 2 0 0  Moving slowly or fidgety/restless 0 - 3 0 0  Suicidal thoughts 0 - - 0 0  PHQ-9 Score 0 7 13 5 10    GAD 7 : Generalized Anxiety Score 09/05/2019 04/20/2018 03/07/2018 01/12/2018  Nervous, Anxious, on Edge 3 3 3 3   Control/stop worrying 3 3 3 3   Worry too much - different things 3 3 3 3   Trouble relaxing 3 3 3 3   Restless 3 3 3 3   Easily annoyed or irritable 3 3 3 3   Afraid - awful might happen 3 3 3  0  Total GAD 7 Score 21 21 21  18

## 2019-09-18 ENCOUNTER — Other Ambulatory Visit: Payer: Self-pay

## 2019-09-18 ENCOUNTER — Ambulatory Visit
Admission: RE | Admit: 2019-09-18 | Discharge: 2019-09-18 | Disposition: A | Discharge status: Home / Self Care | Payer: Medicaid Other | Source: Ambulatory Visit | Attending: Hematology | Admitting: Hematology

## 2019-09-18 ENCOUNTER — Telehealth: Payer: Self-pay | Admitting: *Deleted

## 2019-09-18 ENCOUNTER — Other Ambulatory Visit: Payer: Self-pay | Admitting: Hematology

## 2019-09-18 DIAGNOSIS — Z1231 Encounter for screening mammogram for malignant neoplasm of breast: Secondary | ICD-10-CM

## 2019-09-18 NOTE — Telephone Encounter (Signed)
Called and scheduled the patient for a new patient appt next week. Gave the patient address and policy for mask/visitors

## 2019-09-19 ENCOUNTER — Encounter: Payer: Self-pay | Admitting: *Deleted

## 2019-09-25 NOTE — BH Specialist Note (Signed)
Pt did not arrive to video visit and did not answer the phone ; Left HIPPA-compliant message to call back Roselyn Reef from [facility] at [phone].  ; left MyChart message for patient.    [place] via Telemedicine Video Visit  09/25/2019 Casey Baker [id number]  [name]

## 2019-09-26 ENCOUNTER — Telehealth: Payer: Self-pay | Admitting: *Deleted

## 2019-09-26 ENCOUNTER — Ambulatory Visit: Payer: Medicaid Other | Admitting: Clinical

## 2019-09-26 ENCOUNTER — Other Ambulatory Visit: Payer: Self-pay

## 2019-09-26 DIAGNOSIS — Z91199 Patient's noncompliance with other medical treatment and regimen due to unspecified reason: Secondary | ICD-10-CM

## 2019-09-26 DIAGNOSIS — Z5329 Procedure and treatment not carried out because of patient's decision for other reasons: Secondary | ICD-10-CM

## 2019-09-26 NOTE — Telephone Encounter (Signed)
Per patient's voicemail message canceled appt for tomorrow. Left a message for the patient to call the office back to reschedule appt

## 2019-09-26 NOTE — Progress Notes (Deleted)
Gynecologic Oncology Return Clinic Visit  09/27/19   Reason for Visit: ***  Treatment History: Oncology History Overview Note  Cancer Staging Ductal carcinoma in situ (DCIS) of left breast Staging form: Breast, AJCC 8th Edition - Clinical stage from 02/09/2018: Stage 0 (cTis (DCIS), cN0, cM0, ER+, PR+) - Unsigned - Pathologic stage from 03/18/2018: Stage IA (pT28m, pN1a, cM0, G1, ER+, PR+, HER2-) - Signed by [name] on 03/28/2018     Malignant neoplasm of upper-outer quadrant of left female breast ([place]  01/27/2018 Mammogram   IMPRESSION: Suspicious bloody left nipple discharge with palpable masses in the 2 o'clock axis of the left breast. Findings are suspicious for Malignancy. ADDENDUM: Magnification views of the retroareolar right breast were performed to evaluate calcifications seen on the patient's baseline mammogram. There are loosely grouped and scattered calcifications in the outer right breast, superior to and directly posterior to the nipple. These calcifications are rounded and smudgy in the CC projection, and many of them demonstrate layering on the 90 degree lateral magnification view, consistent with benign milk of calcium. No suspicious microcalcifications are seen in the right breast.   01/31/2018 Initial Biopsy   Diagnosis 01/31/18 1. 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 - NEGATIVE FOR INVASIVE CARCINOMA. 2. 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 - NEGATIVE FOR INVASIVE CARCINOMA.   01/31/2018 Receptors her2   Prognostic indicators significant for: ER, 100% positive and PR, 100% positive, both with strong staining intensity.    02/03/2018 Initial Diagnosis   Ductal carcinoma in situ (DCIS) of left breast   02/10/2018 Genetic Testing   Testing did not  reveal a pathogenic mutation in any of the genes analyzed.A copy of the genetic test report will be scanned into Epic under the Media tab. The genes analyzed were the 23 genes on [facility]'s Breast/GYN panel (ATM, BARD1, BRCA1, BRCA2, BRIP1, CDH1, CHEK2, DICER1, EPCAM, MLH1,  MSH2, MSH6, NBN, NF1, PALB2, PMS2, PTEN, RAD50, RAD51C, RAD51D,SMARCA4, STK11, and TP53).  Genetic testing involved analysis of 9 genes: ATM, BRCA1, BRCA2, CDH1, CHEK2, PALB2, PTEN, STK11 and TP53 genes. Testing was normal and did not reveal a mutation in these genes.     03/18/2018 Surgery   TOTAL LEFT MASTECTOMY WITH AXILLARY SENTINEL LYMPH NODE BIOPSY and  LEFT BREAST RECONSTRUCTION WITH PLACEMENT OF TISSUE EXPANDER AND FLEX HD (ACELLULAR HYDRATED DERMIS) by Dr. HExcell Seltzerand Dr. [name] 03/18/18   03/18/2018 Pathology Results   Diagnosis 03/18/18 1. Lymph node, sentinel, biopsy, Left Axillary #1 - METASTATIC CARCINOMA IN ONE OF ONE LYMPH NODES (1/1). 2. Lymph node, sentinel, biopsy, Left Axillary #2 - ONE OF ONE LYMPH NODES NEGATIVE FOR CARCINOMA (0/1). 3. Breast, simple mastectomy, Left Total - MICROINVASIVE DUCTAL CARCINOMA ARISING IN A BACKGROUND OF LOW GRADE DUCTAL CARCINOMA IN SITU. - DUCTAL CARCINOMA IN SITU PARTIALLY INVOLVES AN INTRADUCTAL PAPILLOMA. - RESECTION MARGINS ARE NEGATIVE FOR INVASIVE CARCINOMA. - IN SITU CARCINOMA IS PRESENT AT THE ANTERIOR MARGIN BROADLY. - BIOPSY SITE. - SEE ONCOLOGY TABLE.    03/18/2018 Receptors her2   Estrogen Receptor: 100%, POSITIVE, MODERATE-WEAK STAINING INTENSITY Progesterone Receptor: 100%, POSITIVE, STRONG STAINING INTENSITY Proliferation Marker Ki67: 1% HER2 - NEGATIVE   03/18/2018 Cancer Staging   Staging form: Breast, AJCC 8th Edition - Pathologic stage from 03/18/2018: Stage IA (pT134m pN1a, cM0, G1, ER+, PR+, HER2-) - Signed  by Truitt Merle, MD on 03/28/2018   05/18/2018 - 06/27/2018 Radiation Therapy   Adjuvant radiation by Dr. Sondra Come    06/27/2018 Mammogram    06/27/2018 Mammogram IMPRESSION: Benign appearing right breast calcifications.  Recommendation: Screening right mammogram is suggested in April 2020.   07/19/2018 -  Anti-estrogen oral therapy   Adjuvant tamoxifen     Survivorship   Per Cira Rue, NP    03/07/2019 Surgery   REMOVAL OF LEFT TISSUE EXPANDER AND PLACEMENT OF SILICONE IMPLANT and LIPOFILLING FROM ABDOMEN TO LEFT CHEST and RIGHT BREAST MASTOPEXY by Dr. Iran Planas  03/07/19     Interval History: ***  Patient states she had a period since for almost a year but started to have some brown/dark discharge this past month and she started having sexual intercourse again after that and had some spotting a few days ago.   She was last seen by me in July 2019 and had spotting for the past two months and hot flashes and night sweats; she was not on tamoxifen therapy at that time. Embx was negative. Still having some hot flashes and night sweats s/s.   From med on note in 10/16: She presents to the clinic alone. She notes she is doing well. She has still has drenching night sweats from hot flashes. She has been taking Xanax 0.42m at night and Gabapentin 304m She notes she is still not sleeping well. She notes she had not taken Effexor due to side effects. She notes her weight is increasing. She notes she has not had her period in almost 1 year. She notes having muscle cramps in her calves but otherwise she can tolerate Tamoxifen and wants to continue and rather not switch to AI in the future.    Past Medical/Surgical History: Past Medical History:  Diagnosis Date  . Anemia   . Anxiety   . Breast cancer ([id number]   left breast  . 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 Family history of breast cancer   . Family history of ovarian cancer   . Genetic testing 02/21/2018   STAT Breast panel with reflex to Breast/GYN panel (23 genes) @ [facility] - No pathogenic mutations detected  . Hypertension   . PONV  (postoperative nausea and vomiting)   . Uterine fibroid     Past Surgical History:  Procedure Laterality Date  . AXILLARY SURGERY    . BREAST RECONSTRUCTION WITH PLACEMENT OF TISSUE EXPANDER AND FLEX HD (ACELLULAR HYDRATED DERMIS) Left 03/18/2018   Procedure: LEFT BREAST RECONSTRUCTION WITH PLACEMENT OF TISSUE EXPANDER AND FLEX HD (ACELLULAR HYDRATED DERMIS);  Surgeon: ThIrene LimboMD;  [facility] Service: Plastics;  Laterality: Left;  . DENTAL SURGERY N/A 07/08/2018  . KNEE ARTHROSCOPY Left 2013  . LIPOSUCTION WITH LIPOFILLING Left 03/07/2019   Procedure: LIPOFILLING FROM ABDOMEN TO LEFT CHEST;  Surgeon: ThIrene LimboMD;  [facility] Service: Plastics;  Laterality: Left;  . Marland KitchenASTECTOMY Left 2019/5  . MASTECTOMY W/ SENTINEL NODE BIOPSY Left 03/18/2018   Procedure: TOTAL LEFT MASTECTOMY WITH AXILLARY SENTINEL LYMPH NODE BIOPSY;  Surgeon: HoExcell SeltzerMD;  [facility] Service: General;  Laterality: Left;  . Marland KitchenASTOPEXY Right 03/07/2019   Procedure: RIGHT BREAST MASTOPEXY;  Surgeon: ThIrene LimboMD;  [facility] Service: Plastics;  Laterality: Right;  . REDUCTION MAMMAPLASTY Right 02/2019  . REMOVAL OF TISSUE EXPANDER AND PLACEMENT OF IMPLANT Left 03/07/2019  Procedure: REMOVAL OF LEFT TISSUE EXPANDER AND PLACEMENT OF SILICONE IMPLANT;  Surgeon: [name], MD;  Location: [place];  Service: Plastics;  Laterality: Left;  . TOOTH EXTRACTION N/A 07/08/2018   Procedure: DENTAL RESTORATION/EXTRACTIONS;  Surgeon: Diona Browner, DDS;  Location: [place];  Service: Oral Surgery;  Laterality: N/A;  . TUBAL LIGATION      Family History  Problem Relation Age of Onset  . Hypertension Mother   . Ovarian cancer Maternal Grandmother        dx 37s; deceased 53s. She declined therapy of any kind including surgery and chemo  . Breast cancer Cousin        dx 97s;  currently 39s; daughter of matenral uncle  . Leukemia Maternal Aunt        currently 37    Social History   Socioeconomic History  . Marital status: Single    Spouse name: Not on file  . Number of children: Not on file  . Years of education: Not on file  . Highest education level: Not on file  Occupational History  . Not on file  Social Needs  . Financial resource strain: Not on file  . Food insecurity    Worry: Not on file    Inability: Not on file  . Transportation needs    Medical: No    Non-medical: No  Tobacco Use  . Smoking status: Never Smoker  . Smokeless tobacco: Never Used  Substance and Sexual Activity  . Alcohol use: No  . Drug use: No  . Sexual activity: Yes  Lifestyle  . Physical activity    Days per week: 2 days    Minutes per session: 50 min  . Stress: Very much  Relationships  . Social connections    Talks on phone: More than three times a week    Gets together: More than three times a week    Attends religious service: More than 4 times per year    Active member of club or organization: No    Attends meetings of clubs or organizations: Never    Relationship status: Never married  Other Topics Concern  . Not on file  Social History Narrative  . Not on file    Current Medications:  Current Outpatient Medications:  .  ALPRAZolam (XANAX) 0.5 MG tablet, Take 1 tablet (0.5 mg total) by mouth 2 (two) times daily as needed for anxiety or sleep., Disp: 60 tablet, Rfl: 0 .  cyclobenzaprine (FLEXERIL) 10 MG tablet, Take 1 tablet (10 mg total) by mouth 3 (three) times daily as needed for muscle spasms., Disp: 30 tablet, Rfl: 0 .  gabapentin (NEURONTIN) 300 MG capsule, Take 1 capsule (300 mg total) by mouth 2 (two) times daily., Disp: 60 capsule, Rfl: 2 .  lisinopril (ZESTRIL) 10 MG tablet, Take 1 tablet (10 mg total) by mouth daily., Disp: 90 tablet, Rfl: 0 .  tamoxifen (NOLVADEX) 20 MG tablet, Take 1 tablet (20 mg total) by mouth daily., Disp: 30 tablet,  Rfl: 2 .  venlafaxine XR (EFFEXOR-XR) 37.5 MG 24 hr capsule, Take 1 capsule (37.5 mg total) by mouth daily with breakfast. (Patient not taking: Reported on 09/05/2019), Disp: 30 capsule, Rfl: 1  Review of Symptoms: Complete 10-system review is negative except ***as above in Interval History.  Physical Exam: LMP  (LMP Unknown) Comment: pt reports last period 11 months ago General: ***Alert, oriented, no acute distress. HEENT: ***Posterior oropharynx clear, sclera anicteric. Chest: ***Clear to auscultation bilaterally.  ***  Port site clean. Cardiovascular: ***Regular rate and rhythm, no murmurs. Abdomen: ***Obese, soft, nontender.  Normoactive bowel sounds.  No masses or hepatosplenomegaly appreciated.  ***Well-healed scar. Extremities: ***Grossly normal range of motion.  Warm, well perfused.  No edema bilaterally. Skin: ***No rashes or lesions noted. Lymphatics: ***No cervical, supraclavicular, or inguinal adenopathy. GU: Normal appearing external genitalia without erythema, excoriation, or lesions.  Speculum exam reveals ***.  Bimanual exam reveals ***.  ***Rectovaginal exam  confirms ___.  Laboratory & Radiologic Studies: 11/17: EMB A. ENDOMETRIUM, BIOPSY:  Blood with scanty cervical and endocervical mucosal.  No endometrial tissue present.   11/17: Pap and HPV High risk HPV Negative   Neisseria Gonorrhea Negative   Chlamydia Negative   Trichomonas Negative   Adequacy Satisfactory for evaluation; transformation zone component PRESENT.   Diagnosis - Negative for intraepithelial lesion or malignancy (NILM)   Comment Normal Reference Range HPV - Negative   Comment Normal Reference [place] Chlamydia - Negative   Comment Normal Reference Range Neisseria Gonorrhea - Negative   Comment Normal Reference Range Trichomonas - Negative     Pelvic ultrasound 04/2018: Uterus Measurements: 17.5 x 15.7 x 18.4 cm. Enlarged nodular uterus containing multiple masses consistent with leiomyomata.  Notable lesions include a large posterior LEFT intramural leiomyoma at the mid uterus 8.1 x 6.5 x 7.0 cm, an anterior intramural leiomyoma at the upper uterine segment 5.5 x 3.8 x 5.3 cm, and a posterior RIGHT intramural leiomyoma 5.6 x 3.7 x 4.0 cm. Numerous additional leiomyomata are seen.  Endometrium Inadequately delineated, likely obscured/distorted by the numerous leiomyomata visualized  Right ovary Nonvisualized likely due to a combination of displacement by enlarged uterus and obscuration by bowel  Left ovary Nonvisualized likely due to a combination of displacement by enlarged uterus and obscuration by bowel  Other findings No free pelvic fluid.  No adnexal masses.  IMPRESSION: Enlarged nodular uterus containing numerous leiomyomata as above.  Nonvisualization of endometrial complex and ovaries.   Assessment & Plan: Casey Baker is a 48 y.o. woman with ER/PR positive breast cancer who started adjuvant tamoxifen treatment in October 2019 presenting with vaginal bleeding with a nondiagnostic endometrial biopsy.  ***  Jeral Pinch, MD  Division of Gynecologic Oncology  Department of Obstetrics and Gynecology  [facility]

## 2019-09-27 ENCOUNTER — Inpatient Hospital Stay: Payer: Medicaid Other | Admitting: Gynecologic Oncology

## 2019-09-27 ENCOUNTER — Telehealth: Payer: Self-pay | Admitting: *Deleted

## 2019-09-27 NOTE — Telephone Encounter (Signed)
Called the patient, rescheduled her canceled appt from yesterday to next week.

## 2019-10-04 NOTE — Progress Notes (Deleted)
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 Findings are suspicious for Malignancy. ADDENDUM: Magnification views of the retroareolar right breast were performed to evaluate calcifications seen on the patient's baseline mammogram. There are loosely grouped and scattered calcifications in the outer right breast, superior to and directly posterior to the nipple. These calcifications are rounded and smudgy in the CC projection, and many of them demonstrate layering on the 90 degree lateral magnification view, consistent with benign milk of calcium. No suspicious microcalcifications are seen in the right breast.   01/31/2018 Initial Biopsy   Diagnosis 01/31/18 1. 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 - NEGATIVE FOR INVASIVE CARCINOMA. 2. 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 - NEGATIVE FOR INVASIVE CARCINOMA.   01/31/2018 Receptors her2   Prognostic indicators significant for: ER, 100% positive and PR, 100% positive, both with strong staining intensity.    02/03/2018 Initial Diagnosis   Ductal carcinoma in situ (DCIS) of left breast   02/10/2018 Genetic Testing   Testing did not  reveal a pathogenic mutation in any of the genes analyzed.A copy of the genetic test report will be scanned into Epic under the Media tab. The genes analyzed were the 23 genes on [facility]'s Breast/GYN panel (ATM, BARD1, BRCA1, BRCA2, BRIP1, CDH1, CHEK2, DICER1, EPCAM, MLH1,  MSH2, MSH6, NBN, NF1, PALB2, PMS2, PTEN, RAD50, RAD51C, RAD51D,SMARCA4, STK11, and TP53).  Genetic testing involved analysis of 9 genes: ATM, BRCA1, BRCA2, CDH1, CHEK2, PALB2, PTEN, STK11 and TP53 genes. Testing was normal and did not reveal a mutation in these genes.     03/18/2018 Surgery   TOTAL LEFT MASTECTOMY WITH AXILLARY SENTINEL LYMPH NODE BIOPSY and  LEFT BREAST RECONSTRUCTION WITH PLACEMENT OF TISSUE EXPANDER AND FLEX HD (ACELLULAR HYDRATED DERMIS) by Dr. HExcell Seltzerand Dr. [name] 03/18/18   03/18/2018 Pathology Results   Diagnosis 03/18/18 1. Lymph node, sentinel, biopsy, Left Axillary #1 - METASTATIC CARCINOMA IN ONE OF ONE LYMPH NODES (1/1). 2. Lymph node, sentinel, biopsy, Left Axillary #2 - ONE OF ONE LYMPH NODES NEGATIVE FOR CARCINOMA (0/1). 3. Breast, simple mastectomy, Left Total - MICROINVASIVE DUCTAL CARCINOMA ARISING IN A BACKGROUND OF LOW GRADE DUCTAL CARCINOMA IN SITU. - DUCTAL CARCINOMA IN SITU PARTIALLY INVOLVES AN INTRADUCTAL PAPILLOMA. - RESECTION MARGINS ARE NEGATIVE FOR INVASIVE CARCINOMA. - IN SITU CARCINOMA IS PRESENT AT THE ANTERIOR MARGIN BROADLY. - BIOPSY SITE. - SEE ONCOLOGY TABLE.    03/18/2018 Receptors her2   Estrogen Receptor: 100%, POSITIVE, MODERATE-WEAK STAINING INTENSITY Progesterone Receptor: 100%, POSITIVE, STRONG STAINING INTENSITY Proliferation Marker Ki67: 1% HER2 - NEGATIVE   03/18/2018 Cancer Staging   Staging form: Breast, AJCC 8th Edition - Pathologic stage from 03/18/2018: Stage IA (pT128m pN1a, cM0, G1, ER+, PR+, HER2-) - Signed  by Truitt Merle, MD on 03/28/2018   05/18/2018 - 06/27/2018 Radiation Therapy   Adjuvant radiation by Dr. Sondra Come    06/27/2018 Mammogram    06/27/2018 Mammogram IMPRESSION: Benign appearing right breast calcifications.  Recommendation: Screening right mammogram is suggested in April 2020.   07/19/2018 -  Anti-estrogen oral therapy   Adjuvant tamoxifen     Survivorship   Per Cira Rue, NP    03/07/2019 Surgery   REMOVAL OF LEFT TISSUE EXPANDER AND PLACEMENT OF SILICONE IMPLANT and LIPOFILLING FROM ABDOMEN TO LEFT CHEST and RIGHT BREAST MASTOPEXY by Dr. Iran Planas  03/07/19     Interval History: ***  ***Referred for h/o fibroids and patient concern about her perceived increased GYN cancer risks.  From the [facility] system 2016 CT where the uterus was over 20cm. TVUS done today as below confirms with no significant increase in size.  She has a h/o ER/PR + breast cancer. Genetic testing has been done ([facility] Breast/GYN panel) and was negative for mutations. She had a mastectomy and is supposed to start radiation soon.  She is concerned about her risk for ovarian cancer and uterine cancer and is seeking a second opinion as to whether to pursue BSO and hysterectomy. She has not discussed BSO with Dr.Feng yet, in the face of ER/PR+ breast cancer. She is going to be starting Tamoxifen per her report to me.  She is having oligomenorrhea with a normal period in December 2018 and then some bleeding again in May 2019. Dr. Ilda Basset performed an EMB which looks to have potential inactive endometrium, no hyperplasia or atypia.***    Past Medical/Surgical History: Past Medical History:  Diagnosis Date  . Anemia   . Anxiety   . Breast cancer ([place]) 2019   left breast  . 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 Family history of breast cancer   . Family history of ovarian cancer   . Genetic testing 02/21/2018   STAT Breast panel with reflex to Breast/GYN panel (23 genes) @ [facility] - No pathogenic mutations detected  . Hypertension   . PONV (postoperative nausea and vomiting)   . Uterine fibroid     Past  Surgical History:  Procedure Laterality Date  . AXILLARY SURGERY    . BREAST RECONSTRUCTION WITH PLACEMENT OF TISSUE EXPANDER AND FLEX HD (ACELLULAR HYDRATED DERMIS) Left 03/18/2018   Procedure: LEFT BREAST RECONSTRUCTION WITH PLACEMENT OF TISSUE EXPANDER AND FLEX HD (ACELLULAR HYDRATED DERMIS);  Surgeon: [name], MD;  Location: [place];  Service: Plastics;  Laterality: Left;  . DENTAL SURGERY N/A 07/08/2018  . KNEE ARTHROSCOPY Left 2013  . LIPOSUCTION WITH LIPOFILLING Left 03/07/2019   Procedure: LIPOFILLING FROM ABDOMEN TO LEFT CHEST;  Surgeon: [name], MD;  Location: [place];  Service: Plastics;  Laterality: Left;  Marland Kitchen MASTECTOMY Left 2019/5  . MASTECTOMY W/ SENTINEL NODE BIOPSY Left 03/18/2018   Procedure: TOTAL LEFT MASTECTOMY WITH AXILLARY SENTINEL LYMPH NODE BIOPSY;  Surgeon: Excell Seltzer, MD;  Location: [place];  Service: General;  Laterality: Left;  Marland Kitchen MASTOPEXY Right 03/07/2019   Procedure: RIGHT BREAST MASTOPEXY;  Surgeon: [name], MD;  Location: [place];  Service: Plastics;  Laterality: Right;  . REDUCTION MAMMAPLASTY Right 02/2019  . REMOVAL OF TISSUE EXPANDER AND PLACEMENT OF IMPLANT Left 03/07/2019   Procedure: REMOVAL OF LEFT TISSUE EXPANDER AND PLACEMENT OF SILICONE IMPLANT;  Surgeon: [name], MD;  Location: [place];  Service: Plastics;  Laterality: Left;  . TOOTH EXTRACTION N/A 07/08/2018   Procedure: DENTAL RESTORATION/EXTRACTIONS;  Surgeon: Diona Browner, DDS;  Location: [place];  Service: Oral Surgery;  Laterality: N/A;  . TUBAL LIGATION      Family History  Problem Relation Age of Onset  . Hypertension Mother   . Ovarian cancer Maternal Grandmother        dx 33s; deceased 69s. She declined therapy of any kind including surgery and chemo  . Breast cancer Cousin        dx 50s; currently 24s; daughter of matenral uncle  . Leukemia Maternal Aunt         currently 25    Social History   Socioeconomic History  . Marital status: Single    Spouse name: Not on file  . Number of children: Not on file  . Years of education: Not on file  . Highest education level: Not on file  Occupational History  . Not on file  Tobacco Use  . Smoking status: Never Smoker  . Smokeless tobacco: Never Used  Substance and Sexual Activity  . Alcohol use: No  . Drug use: No  . Sexual activity: Yes  Other Topics Concern  . Not on file  Social History Narrative  . Not on file   Social Determinants of Health   Financial Resource Strain:   . Difficulty of Paying Living Expenses: Not on file  Food Insecurity:   . Worried About Charity fundraiser in the Last Year: Not on file  . Ran Out of Food in the Last Year: Not on file  Transportation Needs:   . Lack of Transportation (Medical): Not on file  . Lack of Transportation (Non-Medical): Not on file  Physical Activity:   . Days of Exercise per Week: Not on file  . Minutes of Exercise per Session: Not on file  Stress:   . Feeling of Stress : Not on file  Social Connections:   . Frequency of Communication with Friends and Family: Not on file  . Frequency of Social Gatherings with Friends and Family: Not on file  . Attends Religious Services: Not on file  . Active Member of Clubs or Organizations: Not on file  . Attends Archivist Meetings: Not on file  . Marital Status: Not on file   GYN history update: Last Pap test: 09/05/2019-negative, HPV negative  Current Medications:  Current Outpatient Medications:  .  ALPRAZolam (XANAX) 0.5 MG tablet, Take 1 tablet (0.5 mg total) by mouth 2 (two) times daily as needed for anxiety or sleep., Disp: 60 tablet, Rfl: 0 .  cyclobenzaprine (FLEXERIL) 10 MG tablet, Take 1 tablet (10 mg total) by mouth 3 (three) times daily as needed for muscle spasms., Disp: 30 tablet, Rfl: 0 .  gabapentin (NEURONTIN) 300 MG capsule, Take 1 capsule (300 mg total)  by mouth 2 (two) times daily., Disp: 60 capsule, Rfl: 2 .  lisinopril (ZESTRIL) 10 MG tablet, Take 1 tablet (10 mg total) by mouth daily., Disp: 90 tablet, Rfl: 0 .  tamoxifen (NOLVADEX) 20 MG tablet, Take 1 tablet (20 mg total) by mouth daily., Disp: 30 tablet, Rfl: 2 .  venlafaxine XR (EFFEXOR-XR) 37.5 MG 24 hr capsule, Take 1 capsule (37.5 mg total) by mouth daily with breakfast. (Patient not taking: Reported on 09/05/2019), Disp: 30 capsule, Rfl: 1  Review of Symptoms: Complete 10-system review is negative except ***as above in Interval History.  Physical Exam: There were no vitals taken for this visit.  General: ***Alert, oriented, no acute distress. HEENT: ***Posterior oropharynx clear, sclera anicteric. Chest: ***Clear to auscultation bilaterally.  ***Port site clean. Cardiovascular: ***Regular rate and rhythm, no murmurs. Abdomen: ***Obese, soft, nontender.  Normoactive bowel sounds.  No masses or hepatosplenomegaly appreciated.  ***Well-healed scar. Extremities: ***Grossly normal range of motion.  Warm, well perfused.  No edema bilaterally. Skin: ***No rashes or lesions noted. Lymphatics: ***No cervical, supraclavicular, or inguinal adenopathy. GU: Normal appearing external genitalia without erythema, excoriation, or lesions.  Speculum exam reveals ***.  Bimanual exam reveals ***.  ***Rectovaginal exam  confirms ___.  Laboratory & Radiologic Studies: Pelvic ultrasound on 04/29/2018: Uterus  Measurements: 17.5 x 15.7 x 18.4 cm. Enlarged nodular uterus containing multiple masses consistent with leiomyomata. Notable lesions include a large posterior LEFT intramural leiomyoma at the mid uterus 8.1 x 6.5 x 7.0 cm, an anterior intramural leiomyoma at the upper uterine segment 5.5 x 3.8 x 5.3 cm, and a posterior RIGHT intramural leiomyoma 5.6 x 3.7 x 4.0 cm. Numerous additional leiomyomata are seen.  Endometrium  Inadequately delineated, likely obscured/distorted by the  numerous leiomyomata visualized  Right ovary  Nonvisualized likely due to a combination of displacement by enlarged uterus and obscuration by bowel  Left ovary  Nonvisualized likely due to a combination of displacement by enlarged uterus and obscuration by bowel  Other findings  No free pelvic fluid.  No adnexal masses.  IMPRESSION: Enlarged nodular uterus containing numerous leiomyomata as above.  Nonvisualization of endometrial complex and ovaries.  Endometrial biopsy on 09/05/2019: A. ENDOMETRIUM, BIOPSY:  Blood with scanty cervical and endocervical mucosal.  No endometrial tissue present.  CBC    Component Value Date/Time   WBC 3.5 (L) 08/04/2019 0807   WBC 5.0 12/08/2017 1109   RBC 4.85 08/04/2019 0807   HGB 13.8 08/04/2019 0807   HCT 41.2 08/04/2019 0807   PLT 234 08/04/2019 0807   MCV 84.9 08/04/2019 0807   MCH 28.5 08/04/2019 0807   MCHC 33.5 08/04/2019 0807   RDW 13.7 08/04/2019 0807   LYMPHSABS 1.5 08/04/2019 0807   MONOABS 0.5 08/04/2019 0807   EOSABS 0.1 08/04/2019 0807   BASOSABS 0.0 08/04/2019 0807   CMP Latest Ref Rng & Units 08/04/2019 02/17/2019 09/26/2018  Glucose 70 - 99 mg/dL 101(H) 91 83  BUN 6 - 20 mg/dL '10 7 7  ' Creatinine 0.44 - 1.00 mg/dL 0.86 0.80 0.85  Sodium 135 - 145 mmol/L 141 143 144  Potassium 3.5 - 5.1 mmol/L 4.3 4.1 4.3  Chloride 98 - 111 mmol/L 105 108 107  CO2 22 - 32 mmol/L '24 25 27  ' Calcium 8.9 - 10.3 mg/dL 8.9 8.7(L) 9.4  Total Protein 6.5 - 8.1 g/dL 7.3 7.1 7.6  Total Bilirubin 0.3 - 1.2 mg/dL 0.4 0.5 0.6  Alkaline Phos 38 - 126 U/L 62 55 61  AST 15 - 41 U/L 16 13(L) 13(L)  ALT 0 - 44 U/L '11 8 6    ' Assessment & Plan: Casey Baker is a 48 y.o. woman with Stage *** who presents for ***.  1. ***Reassurance was provided today. 2. Ovarian discussion ? Discussed removal of ovaries would be for her breast cancer benefit, not because we were trying to decrease her risk of ovarian ca. I would defer this  discussion to Dr. Burr Medico i. Recommended discussing BSO and/or GnRH with Dr. Burr Medico ? No good screening for ovarian cancer. ? The family history of ovarian cancer is not 100% convincing since surgery was never pursued. It's hard to know  what Gyn etiology may have been present in her M-grandmother ? Reassuring is her negative genetic testing. 3. Uterine fibroids ? Agree with Dr. Ilda Basset that fibroids should shrink with menopause.  i. Not sure if tamoxifen will alter that natural course. Strong studies are lacking although there is data showing they may shrink.  ? I wouldn't screen for uterine cancer unless presents with AUB.  ? EMB reassuring although scant and may not have fully sampled endometrial lining (not unexpected if the fibroids impinge on the endometrial canal) which may be an issue going forward if she needs to be sampled again. ? I don't think she needs an EMB with every menstrual event. If however she has an indication for sampling (ie AUB) then I would consider D&C and potentially offering hysterectomy at that time reasonable. Right now she's not having any bleeding issues to justify morbidity of surgery. 4. Tamoxifen use ? We did review increase risk of uterine CA with Tamoxifen Explained we don't prophylacticaly perform hy   Jeral Pinch, MD  Division of Gynecologic Oncology  Department of Obstetrics and Gynecology  [facility]

## 2019-10-06 ENCOUNTER — Inpatient Hospital Stay: Payer: Medicaid Other | Admitting: Gynecologic Oncology

## 2019-10-06 ENCOUNTER — Telehealth: Payer: Self-pay | Admitting: *Deleted

## 2019-10-06 NOTE — Telephone Encounter (Signed)
Patient called and canceled appt for today. Patient has a sore thorat. She will call back in month to reschedule. She stated I feel ok

## 2019-10-28 ENCOUNTER — Other Ambulatory Visit: Payer: Self-pay | Admitting: Hematology

## 2019-11-01 NOTE — Progress Notes (Signed)
Casey Baker   [phone] [phone]   Clinic Follow up Note   Patient Care Team: Patient, [name] - General (General Practice) Excell Seltzer, MD (Inactive) as Consulting Physician (General Surgery) Truitt Merle, MD as Consulting Physician (Hematology) Gery Pray, MD as Consulting Physician (Radiation Oncology) Alla Feeling, NP as Nurse Practitioner (Nurse Practitioner)   I connected with Casey Baker on 11/06/2019 at 11:00 AM EST by telephone visit and verified that I am speaking with the correct person using two identifiers.  I discussed the limitations, risks, security and privacy concerns of performing an evaluation and management service by telephone and the availability of in person appointments. I also discussed with the patient that there may be a patient responsible charge related to this service. The patient expressed understanding and agreed to proceed.   Patient's location:  Her home  Provider's location:  My Office   CHIEF COMPLAINT: F/u on left breastcancer  SUMMARY OF ONCOLOGIC HISTORY: Oncology History Overview Note  Cancer Staging Ductal carcinoma in situ (DCIS) of left breast Staging form: Breast, AJCC 8th Edition - Clinical stage from 02/09/2018: Stage 0 (cTis (DCIS), cN0, cM0, ER+, PR+) - Unsigned - Pathologic stage from 03/18/2018: Stage IA (pT44m, pN1a, cM0, G1, ER+, PR+, HER2-) - Signed by [name] on 03/28/2018     Malignant neoplasm of upper-outer quadrant of left female breast ([place]  01/27/2018 Mammogram   IMPRESSION: Suspicious bloody left nipple discharge with palpable masses in the 2 o'clock axis of the left breast. Findings are suspicious for Malignancy. ADDENDUM: Magnification views of the retroareolar right breast were performed to evaluate calcifications seen on the patient's baseline mammogram. There are loosely grouped and scattered calcifications in the outer right breast, superior  to and directly posterior to the nipple. These calcifications are rounded and smudgy in the CC projection, and many of them demonstrate layering on the 90 degree lateral magnification view, consistent with benign milk of calcium. No suspicious microcalcifications are seen in the right breast.   01/31/2018 Initial Biopsy   Diagnosis 01/31/18 1. 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 - NEGATIVE FOR INVASIVE CARCINOMA. 2. 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 - NEGATIVE FOR INVASIVE CARCINOMA.   01/31/2018 Receptors her2   Prognostic indicators significant for: ER, 100% positive and PR, 100% positive, both with strong staining intensity.    02/03/2018 Initial Diagnosis   Ductal carcinoma in situ (DCIS) of left breast   02/10/2018 Genetic Testing   Testing did not reveal a pathogenic mutation in any of the genes analyzed.A copy of the genetic test report will be scanned into Epic under the Media tab. The genes analyzed were the 23 genes on [facility]'s Breast/GYN panel (ATM, BARD1, BRCA1, BRCA2, BRIP1, CDH1, CHEK2, DICER1, EPCAM, MLH1,  MSH2, MSH6, NBN, NF1, PALB2, PMS2, PTEN, RAD50, RAD51C, RAD51D,SMARCA4, STK11, and TP53).  Genetic testing involved analysis of 9 genes: ATM, BRCA1, BRCA2, CDH1, CHEK2, PALB2, PTEN, STK11 and TP53 genes. Testing was normal and did not reveal a mutation in these genes.     03/18/2018 Surgery   TOTAL LEFT MASTECTOMY WITH AXILLARY SENTINEL LYMPH NODE BIOPSY and  LEFT BREAST RECONSTRUCTION WITH PLACEMENT OF TISSUE EXPANDER AND FLEX HD (ACELLULAR HYDRATED DERMIS) by Dr. HExcell Seltzerand Dr. [name] 03/18/18   03/18/2018 Pathology  Results   Diagnosis 03/18/18 1. Lymph node, sentinel, biopsy, Left Axillary #1 - METASTATIC CARCINOMA IN ONE OF ONE LYMPH NODES  (1/1). 2. Lymph node, sentinel, biopsy, Left Axillary #2 - ONE OF ONE LYMPH NODES NEGATIVE FOR CARCINOMA (0/1). 3. Breast, simple mastectomy, Left Total - MICROINVASIVE DUCTAL CARCINOMA ARISING IN A BACKGROUND OF LOW GRADE DUCTAL CARCINOMA IN SITU. - DUCTAL CARCINOMA IN SITU PARTIALLY INVOLVES AN INTRADUCTAL PAPILLOMA. - RESECTION MARGINS ARE NEGATIVE FOR INVASIVE CARCINOMA. - IN SITU CARCINOMA IS PRESENT AT THE ANTERIOR MARGIN BROADLY. - BIOPSY SITE. - SEE ONCOLOGY TABLE.    03/18/2018 Receptors her2   Estrogen Receptor: 100%, POSITIVE, MODERATE-WEAK STAINING INTENSITY Progesterone Receptor: 100%, POSITIVE, STRONG STAINING INTENSITY Proliferation Marker Ki67: 1% HER2 - NEGATIVE   03/18/2018 Cancer Staging   Staging form: Breast, AJCC 8th Edition - Pathologic stage from 03/18/2018: Stage IA (pT50m, pN1a, cM0, G1, ER+, PR+, HER2-) - Signed by [name] on 03/28/2018   05/18/2018 - 06/27/2018 Radiation Therapy   Adjuvant radiation by Dr. KSondra Come   06/27/2018 Mammogram   06/27/2018 Mammogram IMPRESSION: Benign appearing right breast calcifications.  Recommendation: Screening right mammogram is suggested in April 2020.   07/19/2018 -  Anti-estrogen oral therapy   Adjuvant tamoxifen     Survivorship   Per LCira Rue NP    03/07/2019 Surgery   REMOVAL OF LEFT TISSUE EXPANDER AND PLACEMENT OF SILICONE IMPLANT and LIPOFILLING FROM ABDOMEN TO LEFT CHEST and RIGHT BREAST MASTOPEXY by Dr. [name] 03/07/19      CURRENT THERAPY:  AdjuvantTamoxifen, started in early Oct 2019  INTERVAL HISTORY:  Casey HUTCHINSis here for a follow up of left breast cancer. They identified themselves by birth date. She notes she is doing well. She denies any new major changes. She notes she has been on Tamoxifen and tolerating well. She notes she has been having body aches and joint pain like in her lower back, arms, shoulders. She feels she is active with exercise. She notes she has been eating  more and this has increased her weight.  She notes she takes Xanax once a day. She tried Effexor but had N&V for those days on it so she stopped. She restarted back on Xanax daily and is low on it.    REVIEW OF SYSTEMS:   Constitutional: Denies fevers, chills or abnormal weight loss Eyes: Denies blurriness of vision Ears, nose, mouth, throat, and face: Denies mucositis or sore throat Respiratory: Denies cough, dyspnea or wheezes Cardiovascular: Denies palpitation, chest discomfort or lower extremity swelling Gastrointestinal:  Denies nausea, heartburn or change in bowel habits Skin: Denies abnormal skin rashes MSK: (+) joint pain increased and body aches  Lymphatics: Denies new lymphadenopathy or easy bruising Neurological:Denies numbness, tingling or new weaknesses Behavioral/Psych: Mood is stable, no new changes  All other systems were reviewed with the patient and are negative.  MEDICAL HISTORY:  Past Medical History:  Diagnosis Date  . Anemia   . Anxiety   . Breast cancer ([place] 2019   left breast  . 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 Family history of breast cancer   . Family history of ovarian cancer   . Genetic testing 02/21/2018   STAT Breast panel with reflex to Breast/GYN panel (23 genes) @ [facility] - No pathogenic mutations detected  . Hypertension   . PONV (postoperative nausea and vomiting)   . Uterine fibroid     SURGICAL HISTORY: Past Surgical History:  Procedure Laterality  Date  . AXILLARY SURGERY    . BREAST RECONSTRUCTION WITH PLACEMENT OF TISSUE EXPANDER AND FLEX HD (ACELLULAR HYDRATED DERMIS) Left 03/18/2018   Procedure: LEFT BREAST RECONSTRUCTION WITH PLACEMENT OF TISSUE EXPANDER AND FLEX HD (ACELLULAR HYDRATED DERMIS);  Surgeon: [name], MD;  Location: [facility];  Service: Plastics;  Laterality: Left;  . DENTAL SURGERY N/A 07/08/2018  . KNEE ARTHROSCOPY Left 2013  . LIPOSUCTION WITH LIPOFILLING Left  03/07/2019   Procedure: LIPOFILLING FROM ABDOMEN TO LEFT CHEST;  Surgeon: [name], MD;  Location: [facility];  Service: Plastics;  Laterality: Left;  Marland Kitchen MASTECTOMY Left 2019/5  . MASTECTOMY W/ SENTINEL NODE BIOPSY Left 03/18/2018   Procedure: TOTAL LEFT MASTECTOMY WITH AXILLARY SENTINEL LYMPH NODE BIOPSY;  Surgeon: Excell Seltzer, MD;  Location: [facility];  Service: General;  Laterality: Left;  Marland Kitchen MASTOPEXY Right 03/07/2019   Procedure: RIGHT BREAST MASTOPEXY;  Surgeon: [name], MD;  Location: [facility];  Service: Plastics;  Laterality: Right;  . REDUCTION MAMMAPLASTY Right 02/2019  . REMOVAL OF TISSUE EXPANDER AND PLACEMENT OF IMPLANT Left 03/07/2019   Procedure: REMOVAL OF LEFT TISSUE EXPANDER AND PLACEMENT OF SILICONE IMPLANT;  Surgeon: [name], MD;  Location: [facility];  Service: Plastics;  Laterality: Left;  . TOOTH EXTRACTION N/A 07/08/2018   Procedure: DENTAL RESTORATION/EXTRACTIONS;  Surgeon: Diona Browner, DDS;  Location: [facility];  Service: Oral Surgery;  Laterality: N/A;  . TUBAL LIGATION      I have reviewed the social history and family history with the patient and they are unchanged from previous note.  ALLERGIES:  has No Known Allergies.  MEDICATIONS:  Current Outpatient Medications  Medication Sig Dispense Refill  . ALPRAZolam (XANAX) 0.5 MG tablet Take 1 tablet (0.5 mg total) by mouth at bedtime as needed for anxiety or sleep. 30 tablet 0  . citalopram (CELEXA) 10 MG tablet Take 1 tablet (10 mg total) by mouth daily. 20 tablet 0  . cyclobenzaprine (FLEXERIL) 10 MG tablet Take 1 tablet (10 mg total) by mouth 3 (three) times daily as needed for muscle spasms. 30 tablet 0  . gabapentin (NEURONTIN) 300 MG capsule Take 1 capsule (300 mg total) by mouth 2 (two) times daily. 60 capsule 2  . lisinopril (ZESTRIL) 10 MG tablet Take 1 tablet (10 mg total) by mouth daily. 90 tablet 0  . tamoxifen  (NOLVADEX) 20 MG tablet TAKE 1 TABLET(20 MG) BY MOUTH DAILY 30 tablet 2   No current facility-administered medications for this visit.    PHYSICAL EXAMINATION: ECOG PERFORMANCE STATUS: 1 - Symptomatic but completely ambulatory  No vitals taken today, Exam not performed today   LABORATORY DATA:  I have reviewed the data as listed CBC Latest Ref Rng & Units 08/04/2019 02/17/2019 09/26/2018  WBC 4.0 - 10.5 K/uL 3.5(L) 2.8(L) 2.4(L)  Hemoglobin 12.0 - 15.0 g/dL 13.8 13.5 13.6  Hematocrit 36.0 - 46.0 % 41.2 41.8 44.0  Platelets 150 - 400 K/uL 234 220 230     CMP Latest Ref Rng & Units 08/04/2019 02/17/2019 09/26/2018  Glucose 70 - 99 mg/dL 101(H) 91 83  BUN 6 - 20 mg/dL '10 7 7  ' Creatinine 0.44 - 1.00 mg/dL 0.86 0.80 0.85  Sodium 135 - 145 mmol/L 141 143 144  Potassium 3.5 - 5.1 mmol/L 4.3 4.1 4.3  Chloride 98 - 111 mmol/L 105 108 107  CO2 22 - 32 mmol/L '24 25 27  ' Calcium 8.9 - 10.3 mg/dL 8.9 8.7(L)  9.4  Total Protein 6.5 - 8.1 g/dL 7.3 7.1 7.6  Total Bilirubin 0.3 - 1.2 mg/dL 0.4 0.5 0.6  Alkaline Phos 38 - 126 U/L 62 55 61  AST 15 - 41 U/L 16 13(L) 13(L)  ALT 0 - 44 U/L '11 8 6      ' RADIOGRAPHIC STUDIES: I have personally reviewed the radiological images as listed and agreed with the findings in the report. No results found.   ASSESSMENT & PLAN:  Casey Baker is a 49 y.o. female with    1. 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 Treated with left total mastectomy and breast reconstruction and radiation. Currently onadjuvantTamoxifen. -She is clinically doing well. She has been having body aches and increased joint pain. She also has been working out more. She otherwise has no concerns about her breast cancer.  -Continue surveillance. Next Mammogram in 08/2020.  -Continue Tamoxifen -F/u in  3-4 months    2.Hot Flashes,depression and anxiety, insomnia -These symptoms were noted before starting Tamoxifen. Nunzio Cory tried gabapentin, Melatonin, Ambien but without much relief.  -Her concerns with COVID-19pandemichave exacerbated her anxiety  -I previously offered her the chance to speak with our counselor or someone. She has declined for now.She denied suicidal ideas -She has vaginal dryness, no discharge. I advised her not to use estrogen containing creams as this is contraindicated with her Tamoxifen. I recommend vaginal lubricationsas needed. -She is on Xanax 0.77m nightly for her anxiety. She feels her anxiety has much improved.  -I discussed she can increase Gabapentin up to [id number] night. She tried to Effexor but stopped due to N&V. She is currently on Xanax once at night.  -I discussed Celexa [id number] to help hot flushes and wean off Xanax. I reviewed side effects with her. She is willing to try.   3. HTN,poorly controlled -She is currently on Lisinopril -I advised her to monitor her BP at home and informed her that if her BP remains high,she needs to f/u with PCP  4.Mild neutropenia -She has developed mild leukopenia, in the past several months, previously white count was normal. -I reviewed COVID-19 precautions.  -Continue monitoring.  5. Weight gain  -The patient is concerned with her weight trending up -I discussed Tamoxifen can lead to weight gain.  -I discussed healthy weight and management, I previously referred her.   Plan -I refilled Xanax and prescribed Celexa for her hot flushes and help her to wean off Xanax.  -Continue Tamoxifen  -Lab and f/u in 3-4 months    No problem-specific Assessment & Plan notes found for this encounter.   No orders of the defined types were placed in this encounter.  I discussed the assessment and treatment plan with the patient. The patient was provided an opportunity to ask questions and all  were answered. The patient agreed with the plan and demonstrated an understanding of the instructions.  The patient was advised to call back or seek an in-person evaluation if the symptoms worsen or if the condition fails to improve as anticipated.  The total time spent in the appointment was 22 minutes.    Breckyn Troyer Truitt Merle 11/06/2019   I, AmoyJoslyn Devon acting as scribe for Glorine Hanratty Truitt Merle.   I have reviewed the above documentation for accuracy and completeness, and I agree with the above.

## 2019-11-06 ENCOUNTER — Inpatient Hospital Stay: Payer: Medicaid Other | Attending: Nurse Practitioner | Admitting: Hematology

## 2019-11-06 ENCOUNTER — Encounter: Payer: Self-pay | Admitting: Hematology

## 2019-11-06 DIAGNOSIS — Z170 Estrogen receptor positive status [ER+]: Secondary | ICD-10-CM

## 2019-11-06 DIAGNOSIS — C50412 Malignant neoplasm of upper-outer quadrant of left female breast: Secondary | ICD-10-CM

## 2019-11-06 MED ORDER — CITALOPRAM HYDROBROMIDE 10 MG PO TABS: 10.0000 mg | ORAL_TABLET | Freq: Every day | ORAL | 0 refills | Status: DC

## 2019-11-06 MED ORDER — ALPRAZOLAM 0.5 MG PO TABS: 0.5000 mg | ORAL_TABLET | Freq: Every evening | ORAL | 0 refills | Status: DC | PRN

## 2019-11-07 ENCOUNTER — Telehealth: Payer: Self-pay | Admitting: Hematology

## 2019-11-07 NOTE — Telephone Encounter (Signed)
Scheduled appt per 1/18 los.  Sent a message to HIM pool to get a calendar mailed out. 

## 2019-12-04 ENCOUNTER — Other Ambulatory Visit: Payer: Self-pay | Admitting: Hematology

## 2019-12-04 DIAGNOSIS — C50412 Malignant neoplasm of upper-outer quadrant of left female breast: Secondary | ICD-10-CM

## 2019-12-04 DIAGNOSIS — Z170 Estrogen receptor positive status [ER+]: Secondary | ICD-10-CM

## 2019-12-04 MED ORDER — ALPRAZOLAM 0.5 MG PO TABS: 0.5000 mg | ORAL_TABLET | Freq: Every evening | ORAL | 0 refills | Status: DC | PRN

## 2019-12-04 MED ORDER — CITALOPRAM HYDROBROMIDE 10 MG PO TABS: 10.0000 mg | ORAL_TABLET | Freq: Every day | ORAL | 2 refills | Status: DC

## 2019-12-04 NOTE — Telephone Encounter (Signed)
HI Dr. Burr Medico, Please refill or deny as appropriate for this patient.  Thanks! Threasa Beards

## 2020-01-05 ENCOUNTER — Other Ambulatory Visit: Payer: Self-pay | Admitting: Hematology

## 2020-01-05 DIAGNOSIS — I10 Essential (primary) hypertension: Secondary | ICD-10-CM

## 2020-01-08 NOTE — Telephone Encounter (Signed)
Refill request

## 2020-01-09 NOTE — Telephone Encounter (Signed)
This has not been filled in 6 months, please call patient to get refill from PCP.  Thanks, Regan Rakers

## 2020-01-11 ENCOUNTER — Other Ambulatory Visit: Payer: Self-pay

## 2020-01-11 DIAGNOSIS — I10 Essential (primary) hypertension: Secondary | ICD-10-CM

## 2020-01-11 MED ORDER — LISINOPRIL 10 MG PO TABS: 10.0000 mg | ORAL_TABLET | Freq: Every day | ORAL | 0 refills | Status: DC

## 2020-01-26 ENCOUNTER — Telehealth: Payer: Self-pay

## 2020-01-26 NOTE — Telephone Encounter (Signed)
Medicaid recertification faxed to [facility] @ [place].

## 2020-02-05 ENCOUNTER — Other Ambulatory Visit: Payer: Self-pay | Admitting: Hematology

## 2020-02-07 NOTE — Progress Notes (Signed)
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 Findings are suspicious for Malignancy. ADDENDUM: Magnification views of the retroareolar right breast were performed to evaluate calcifications seen on the patient's baseline mammogram. There are loosely grouped and scattered calcifications in the outer right breast, superior to and directly posterior to the nipple. These calcifications are rounded and smudgy in the CC projection, and many of them demonstrate layering on the 90 degree lateral magnification view, consistent with benign milk of calcium. No suspicious microcalcifications are seen in the right breast.   01/31/2018 Initial Biopsy   Diagnosis 01/31/18 1. 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 - NEGATIVE FOR INVASIVE CARCINOMA. 2. Breast, left,  needle core biopsy, 2 o'clock, 10 cfn - LOW GRADE DUCTAL CARCINOMA IN SITU (DCIS) PARTIALLY INVOLVING AN INTRADUCTAL PAPILLOMA. - NEGATIVE FOR INVASIVE CARCINOMA.   01/31/2018 Receptors her2   Prognostic indicators significant for: ER, 100% positive and PR, 100% positive, both with strong staining intensity.    02/03/2018 Initial Diagnosis   Ductal carcinoma in situ (DCIS) of left breast   02/10/2018 Genetic Testing   Testing did not reveal a pathogenic mutation in any of the genes analyzed.A copy of the genetic test report will be scanned into Epic under the Media tab. The genes analyzed were the 23 genes on [facility]'s Breast/GYN panel (ATM, BARD1, BRCA1, BRCA2, BRIP1, CDH1, CHEK2, DICER1, EPCAM, MLH1,  MSH2, MSH6, NBN, NF1, PALB2, PMS2, PTEN, RAD50, RAD51C, RAD51D,SMARCA4, STK11, and TP53).  Genetic testing involved analysis of 9 genes: ATM, BRCA1, BRCA2, CDH1, CHEK2, PALB2, PTEN, STK11 and TP53 genes. Testing was normal and did not reveal a mutation in these genes.     03/18/2018 Surgery   TOTAL LEFT MASTECTOMY WITH AXILLARY SENTINEL LYMPH NODE BIOPSY and  LEFT BREAST RECONSTRUCTION WITH PLACEMENT OF TISSUE EXPANDER AND FLEX HD (ACELLULAR HYDRATED DERMIS) by Dr. HExcell Seltzerand Dr. [name] 03/18/18   03/18/2018 Pathology Results   Diagnosis 03/18/18 1. Lymph node, sentinel, biopsy, Left Axillary #1 - METASTATIC CARCINOMA IN ONE OF ONE LYMPH NODES (1/1). 2. Lymph node, sentinel, biopsy, Left Axillary #2 - ONE OF ONE LYMPH NODES NEGATIVE FOR CARCINOMA (0/1). 3. Breast, simple mastectomy, Left Total - MICROINVASIVE DUCTAL CARCINOMA ARISING IN A BACKGROUND OF LOW GRADE DUCTAL CARCINOMA IN SITU. - DUCTAL CARCINOMA IN SITU PARTIALLY INVOLVES AN INTRADUCTAL PAPILLOMA. - RESECTION MARGINS ARE NEGATIVE FOR INVASIVE CARCINOMA. - IN SITU CARCINOMA IS PRESENT AT THE ANTERIOR MARGIN  BROADLY. - BIOPSY SITE. - SEE ONCOLOGY TABLE.    03/18/2018 Receptors her2   Estrogen Receptor: 100%,  POSITIVE, MODERATE-WEAK STAINING INTENSITY Progesterone Receptor: 100%, POSITIVE, STRONG STAINING INTENSITY Proliferation Marker Ki67: 1% HER2 - NEGATIVE   03/18/2018 Cancer Staging   Staging form: Breast, AJCC 8th Edition - Pathologic stage from 03/18/2018: Stage IA (pT36m, pN1a, cM0, G1, ER+, PR+, HER2-) - Signed by [name] on 03/28/2018   05/18/2018 - 06/27/2018 Radiation Therapy   Adjuvant radiation by Dr. KSondra Come   06/27/2018 Mammogram   06/27/2018 Mammogram IMPRESSION: Benign appearing right breast calcifications.  Recommendation: Screening right mammogram is suggested in April 2020.   07/19/2018 -  Anti-estrogen oral therapy   Adjuvant tamoxifen     Survivorship   Per LCira Rue NP    03/07/2019 Surgery   REMOVAL OF LEFT TISSUE EXPANDER AND PLACEMENT OF SILICONE IMPLANT and LIPOFILLING FROM ABDOMEN TO LEFT CHEST and RIGHT BREAST MASTOPEXY by Dr. [name] 03/07/19      CURRENT THERAPY:  AdjuvantTamoxifen, started in early Oct 2019  INTERVAL HISTORY:  Casey BUTZERis here for a follow up of left breast cancer. She presents to the clinic alone. She notes she is doing well. She notes she gets nervous in clinic and that is why her heart rate is elevated. She notes she stopped Celexa 181mafter 1 months due to drowsiness. She notes the drowsiness from Celexa was effecting her job. She notes she feels fine off Celexa but still has high anxiety and issues with sleep. She notes she is still on Xanax 1-2 tabs on days she needs it.  She notes she has been stressed about coming to clinic today which effected her stress and sleep. She notes another physicians notes she gained 20 pounds. She is waiting to have left breast revision by Dr ThIran PlanasApple Cider Vinegar Vitamin Supplement pill. She notes her hot flashes are manageable but exacerbated by spicy foods or sugar. She has been watching her diet. She denies new symptoms and has adequate appetite.  She was considering  hysterectomy due to fibroids and previous vaginal spotting. She does not think she will proceed with it.     REVIEW OF SYSTEMS:   Constitutional: Denies fevers, chills or abnormal weight loss Eyes: Denies blurriness of vision Ears, nose, mouth, throat, and face: Denies mucositis or sore throat Respiratory: Denies cough, dyspnea or wheezes Cardiovascular: Denies palpitation, chest discomfort or lower extremity swelling Gastrointestinal:  Denies nausea, heartburn or change in bowel habits Skin: Denies abnormal skin rashes Lymphatics: Denies new lymphadenopathy or easy bruising Neurological:Denies numbness, tingling or new weaknesses Behavioral/Psych: Mood is stable, no new changes (+) Anxiety  All other systems were reviewed with the patient and are negative.  MEDICAL HISTORY:  Past Medical History:  Diagnosis Date  . Anemia   . Anxiety   . Breast cancer ([facility]   left breast  . 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 Family history of breast cancer   . Family history of ovarian cancer   . Genetic testing 02/21/2018   STAT Breast panel with reflex to Breast/GYN panel (23 genes) @ [facility] - No pathogenic mutations detected  . Hypertension   . PONV (postoperative nausea and vomiting)   . Uterine fibroid     SURGICAL HISTORY: Past Surgical History:  Procedure Laterality Date  . AXILLARY SURGERY    . BREAST RECONSTRUCTION WITH PLACEMENT OF TISSUE EXPANDER AND FLEX HD (ACELLULAR HYDRATED DERMIS) Left  03/18/2018   Procedure: LEFT BREAST RECONSTRUCTION WITH PLACEMENT OF TISSUE EXPANDER AND FLEX HD (ACELLULAR HYDRATED DERMIS);  Surgeon: [name], MD;  Location: [facility];  Service: Plastics;  Laterality: Left;  . DENTAL SURGERY N/A 07/08/2018  . KNEE ARTHROSCOPY Left 2013  . LIPOSUCTION WITH LIPOFILLING Left 03/07/2019   Procedure: LIPOFILLING FROM ABDOMEN TO LEFT CHEST;  Surgeon: [name], MD;  Location: [facility];  Service: Plastics;  Laterality: Left;  Marland Kitchen MASTECTOMY Left 2019/5  . MASTECTOMY W/ SENTINEL NODE BIOPSY Left 03/18/2018   Procedure: TOTAL LEFT MASTECTOMY WITH AXILLARY SENTINEL LYMPH NODE BIOPSY;  Surgeon: Excell Seltzer, MD;  Location: [facility];  Service: General;  Laterality: Left;  Marland Kitchen MASTOPEXY Right 03/07/2019   Procedure: RIGHT BREAST MASTOPEXY;  Surgeon: [name], MD;  Location: [facility];  Service: Plastics;  Laterality: Right;  . REDUCTION MAMMAPLASTY Right 02/2019  . REMOVAL OF TISSUE EXPANDER AND PLACEMENT OF IMPLANT Left 03/07/2019   Procedure: REMOVAL OF LEFT TISSUE EXPANDER AND PLACEMENT OF SILICONE IMPLANT;  Surgeon: [name], MD;  Location: [facility];  Service: Plastics;  Laterality: Left;  . TOOTH EXTRACTION N/A 07/08/2018   Procedure: DENTAL RESTORATION/EXTRACTIONS;  Surgeon: Diona Browner, DDS;  Location: [facility];  Service: Oral Surgery;  Laterality: N/A;  . TUBAL LIGATION      I have reviewed the social history and family history with the patient and they are unchanged from previous note.  ALLERGIES:  has No Known Allergies.  MEDICATIONS:  Current Outpatient Medications  Medication Sig Dispense Refill  . ALPRAZolam (XANAX) 0.5 MG tablet Take 1 tablet (0.5 mg total) by mouth at bedtime as needed for anxiety or sleep. 30 tablet 0  . citalopram (CELEXA) 10 MG tablet Take 1 tablet (10 mg total) by mouth daily. 30 tablet 2  . cyclobenzaprine (FLEXERIL) 10 MG tablet Take 1 tablet (10 mg total) by mouth 3 (three) times daily as needed for muscle spasms. 30 tablet 0  . gabapentin (NEURONTIN) 300 MG capsule Take 1 capsule (300 mg total) by mouth 2 (two) times daily. 60 capsule 2  . lisinopril (ZESTRIL) 10 MG tablet Take 1 tablet (10 mg total) by mouth daily. 90 tablet 0  . tamoxifen (NOLVADEX) 20 MG tablet TAKE 1 TABLET(20 MG) BY MOUTH DAILY 30 tablet 2   No current facility-administered medications for this  visit.    PHYSICAL EXAMINATION: ECOG PERFORMANCE STATUS: 0 - Asymptomatic  Vitals:   02/12/20 1350  BP: (!) 145/94  Pulse: (!) 133  Resp: 18  Temp: 98.5 F (36.9 C)  SpO2: 100%   Filed Weights   02/12/20 1350  Weight: 189 lb 4.8 oz (85.9 kg)    GENERAL:alert, no distress and comfortable SKIN: skin color, texture, turgor are normal, no rashes or significant lesions EYES: normal, Conjunctiva are pink and non-injected, sclera clear  NECK: supple, thyroid normal size, non-tender, without nodularity LYMPH:  no palpable lymphadenopathy in the cervical, axillary  LUNGS: clear to auscultation and percussion with normal breathing effort HEART: regular rate & rhythm and no murmurs and no lower extremity edema ABDOMEN:abdomen soft, non-tender and normal bowel sounds Musculoskeletal:no cyanosis of digits and no clubbing  NEURO: alert & oriented x 3 with fluent speech, no focal motor/sensory deficits BREAST: S/p left mastectomy and B/l breast reconstruction: Surgical incisions healed well with scar tissue. No palpable mass, nodules or adenopathy bilaterally. Breast exam benign.   LABORATORY DATA:  I have reviewed the data  as listed CBC Latest Ref Rng & Units 02/12/2020 08/04/2019 02/17/2019  WBC 4.0 - 10.5 K/uL 3.7(L) 3.5(L) 2.8(L)  Hemoglobin 12.0 - 15.0 g/dL 14.1 13.8 13.5  Hematocrit 36.0 - 46.0 % 42.5 41.2 41.8  Platelets 150 - 400 K/uL 235 234 220     CMP Latest Ref Rng & Units 02/12/2020 08/04/2019 02/17/2019  Glucose 70 - 99 mg/dL 150(H) 101(H) 91  BUN 6 - 20 mg/dL '9 10 7  ' Creatinine 0.44 - 1.00 mg/dL 0.95 0.86 0.80  Sodium 135 - 145 mmol/L 143 141 143  Potassium 3.5 - 5.1 mmol/L 3.5 4.3 4.1  Chloride 98 - 111 mmol/L 105 105 108  CO2 22 - 32 mmol/L '26 24 25  ' Calcium 8.9 - 10.3 mg/dL 9.3 8.9 8.7(L)  Total Protein 6.5 - 8.1 g/dL 7.5 7.3 7.1  Total Bilirubin 0.3 - 1.2 mg/dL 0.6 0.4 0.5  Alkaline Phos 38 - 126 U/L 62 62 55  AST 15 - 41 U/L 13(L) 16 13(L)  ALT 0 - 44 U/L '10 11 8       ' RADIOGRAPHIC STUDIES: I have personally reviewed the radiological images as listed and agreed with the findings in the report. No results found.   ASSESSMENT & PLAN:  EQUILLA QUE is a 49 y.o. female with    1. 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 Treated with left total mastectomyand breast reconstructionand radiation. Currently onadjuvantTamoxifen. -I discussed her switching to AI for an additional 5 years when she becomes post-menopausal. She is not interested and would like to continue Tamoxifen.  -She is clinically doing well. Lab reviewed, her CBC and CMP are within normal limits except WBC 3.7, BG 150. Her physical exam and her 08/2019 mammogram were unremarkable. There is no clinical concern for recurrence. -Continue surveillance. Next Mammogram in 08/2020.  -Continue Tamoxifen -F/u in 6 months    2.Hot Flashes,depression and anxiety, insomnia -These symptoms were noted before starting Tamoxifen. -She has vaginal dryness, no discharge. I advised her not to use estrogen containing creams as this is contraindicated with her Tamoxifen. I recommend vaginal lubricationsas needed. Nunzio Cory tried gabapentin, Melatonin, Ambien but without much relief. She tried to Effexor but stopped due to N&V. -Her concerns with COVID-19pandemichave exacerbated her anxiety. She has received both her COVID19 vaccinations.  -Her hot flashes are more manageable based on her diet.  -She tried Celexa 41m but stopped after 1 month due to drowsiness which was effecting her job.  -Given she is able to tolerate Xanax 0.5cm, she is fine to continue only as needed, not daily. I will refill today (02/12/20).  -I encouraged her to work on reducing stress.    3. HTN,poorly controlled, Elevated heart rate   -She is currently on Lisinopril -I advised her to monitor her BP at home and informed her that if her BP remains high,she needs to f/u with PCP -She has both elevated BP (145/94) and heart rate (133) today.   4.Mild neutropenia -She has developed mild leukopenia, in the past several months, previously white count was normal. -I reviewed COVID-19 precautions.  -Continue monitoring. Trending up, improved.   5. Weight gain  -The patient is concerned with her weight trending up -I discussed Tamoxifen can lead to weight gain.  -I discussed healthy weight and management, I previously referred her.  -She has only had mild weight gain  since last visit.   Plan -Continue Tamoxifen -she has stopped celexa, I refilled Xanax for her today  -Lab and f/u in 6 months   No problem-specific Assessment & Plan notes found for this encounter.   No orders of the defined types were placed in this encounter.  All questions were answered. The patient knows to call the clinic with any problems, questions or concerns. No barriers to learning was detected. The total time spent in the appointment was 25 minutes.     Truitt Merle, MD 02/12/2020   I, Joslyn Devon, am acting as scribe for Truitt Merle, MD.   I have reviewed the above documentation for accuracy and completeness, and I agree with the above.

## 2020-02-12 ENCOUNTER — Encounter: Payer: Self-pay | Admitting: Hematology

## 2020-02-12 ENCOUNTER — Other Ambulatory Visit: Payer: Self-pay

## 2020-02-12 ENCOUNTER — Inpatient Hospital Stay: Payer: Medicaid Other | Attending: Hematology | Admitting: Hematology

## 2020-02-12 ENCOUNTER — Inpatient Hospital Stay: Payer: Medicaid Other

## 2020-02-12 VITALS — BP 145/94 | HR 133 | Temp 98.5°F | Resp 18 | Ht 67.0 in | Wt 189.3 lb

## 2020-02-12 DIAGNOSIS — Z170 Estrogen receptor positive status [ER+]: Secondary | ICD-10-CM | POA: Diagnosis not present

## 2020-02-12 DIAGNOSIS — I10 Essential (primary) hypertension: Secondary | ICD-10-CM | POA: Diagnosis not present

## 2020-02-12 DIAGNOSIS — F329 Major depressive disorder, single episode, unspecified: Secondary | ICD-10-CM | POA: Diagnosis not present

## 2020-02-12 DIAGNOSIS — R635 Abnormal weight gain: Secondary | ICD-10-CM | POA: Insufficient documentation

## 2020-02-12 DIAGNOSIS — C50412 Malignant neoplasm of upper-outer quadrant of left female breast: Secondary | ICD-10-CM

## 2020-02-12 DIAGNOSIS — D709 Neutropenia, unspecified: Secondary | ICD-10-CM | POA: Insufficient documentation

## 2020-02-12 DIAGNOSIS — Z79899 Other long term (current) drug therapy: Secondary | ICD-10-CM | POA: Diagnosis not present

## 2020-02-12 DIAGNOSIS — Z9012 Acquired absence of left breast and nipple: Secondary | ICD-10-CM | POA: Insufficient documentation

## 2020-02-12 DIAGNOSIS — Z79810 Long term (current) use of selective estrogen receptor modulators (SERMs): Secondary | ICD-10-CM | POA: Insufficient documentation

## 2020-02-12 LAB — CBC WITH DIFFERENTIAL (CANCER CENTER ONLY)
Abs Immature Granulocytes: 0 10*3/uL (ref 0.00–0.07)
Basophils Absolute: 0 10*3/uL (ref 0.0–0.1)
Basophils Relative: 1 %
Eosinophils Absolute: 0.1 10*3/uL (ref 0.0–0.5)
Eosinophils Relative: 2 %
HCT: 42.5 % (ref 36.0–46.0)
Hemoglobin: 14.1 g/dL (ref 12.0–15.0)
Immature Granulocytes: 0 %
Lymphocytes Relative: 37 %
Lymphs Abs: 1.4 10*3/uL (ref 0.7–4.0)
MCH: 28.7 pg (ref 26.0–34.0)
MCHC: 33.2 g/dL (ref 30.0–36.0)
MCV: 86.4 fL (ref 80.0–100.0)
Monocytes Absolute: 0.3 10*3/uL (ref 0.1–1.0)
Monocytes Relative: 8 %
Neutro Abs: 1.9 10*3/uL (ref 1.7–7.7)
Neutrophils Relative %: 52 %
Platelet Count: 235 10*3/uL (ref 150–400)
RBC: 4.92 MIL/uL (ref 3.87–5.11)
RDW: 13.9 % (ref 11.5–15.5)
WBC Count: 3.7 10*3/uL — ABNORMAL LOW (ref 4.0–10.5)
nRBC: 0 % (ref 0.0–0.2)

## 2020-02-12 LAB — CMP (CANCER CENTER ONLY)
ALT: 10 U/L (ref 0–44)
AST: 13 U/L — ABNORMAL LOW (ref 15–41)
Albumin: 3.8 g/dL (ref 3.5–5.0)
Alkaline Phosphatase: 62 U/L (ref 38–126)
Anion gap: 12 (ref 5–15)
BUN: 9 mg/dL (ref 6–20)
CO2: 26 mmol/L (ref 22–32)
Calcium: 9.3 mg/dL (ref 8.9–10.3)
Chloride: 105 mmol/L (ref 98–111)
Creatinine: 0.95 mg/dL (ref 0.44–1.00)
GFR, Est AFR Am: >60 mL/min (ref >60)
GFR, Estimated: >60 mL/min (ref >60)
Glucose, Bld: 150 mg/dL — ABNORMAL HIGH (ref 70–99)
Potassium: 3.5 mmol/L (ref 3.5–5.1)
Sodium: 143 mmol/L (ref 135–145)
Total Bilirubin: 0.6 mg/dL (ref 0.3–1.2)
Total Protein: 7.5 g/dL (ref 6.5–8.1)

## 2020-02-12 MED ORDER — ALPRAZOLAM 0.5 MG PO TABS: 0.5000 mg | ORAL_TABLET | Freq: Every evening | ORAL | 0 refills | Status: DC | PRN

## 2020-02-13 ENCOUNTER — Telehealth: Payer: Self-pay | Admitting: Hematology

## 2020-02-13 NOTE — Telephone Encounter (Signed)
Scheduled appt per 4/26 los.  Spoke with pt and she is aware of the scheduled appt date and time.

## 2020-03-04 ENCOUNTER — Other Ambulatory Visit: Payer: Self-pay | Admitting: Hematology

## 2020-03-04 ENCOUNTER — Ambulatory Visit: Payer: Medicaid Other | Admitting: Hematology

## 2020-03-04 ENCOUNTER — Other Ambulatory Visit: Payer: Medicaid Other

## 2020-03-27 ENCOUNTER — Other Ambulatory Visit: Payer: Self-pay | Admitting: Hematology

## 2020-03-27 DIAGNOSIS — I10 Essential (primary) hypertension: Secondary | ICD-10-CM

## 2020-03-27 DIAGNOSIS — Z170 Estrogen receptor positive status [ER+]: Secondary | ICD-10-CM

## 2020-03-27 MED ORDER — LISINOPRIL 10 MG PO TABS: 10.0000 mg | ORAL_TABLET | Freq: Every day | ORAL | 0 refills | Status: DC

## 2020-03-27 MED ORDER — TAMOXIFEN CITRATE 20 MG PO TABS: ORAL_TABLET | ORAL | 5 refills | Status: DC

## 2020-03-27 MED ORDER — GABAPENTIN 300 MG PO CAPS: 300.0000 mg | ORAL_CAPSULE | Freq: Two times a day (BID) | ORAL | 2 refills | Status: DC

## 2020-03-29 ENCOUNTER — Telehealth: Payer: Self-pay

## 2020-03-29 ENCOUNTER — Other Ambulatory Visit: Payer: Self-pay | Admitting: Hematology

## 2020-03-29 DIAGNOSIS — Z170 Estrogen receptor positive status [ER+]: Secondary | ICD-10-CM

## 2020-03-29 MED ORDER — ALPRAZOLAM 0.5 MG PO TABS: 0.5000 mg | ORAL_TABLET | Freq: Every evening | ORAL | 0 refills | Status: DC | PRN

## 2020-03-29 NOTE — Telephone Encounter (Signed)
I reviewed MS Diskin's symptoms with Dr. Burr Medico.   Dr. Burr Medico recommends non weight bearing exercise, as she is walking for exercise at this time.  Dr. Burr Medico also recommends glucosamine as well.  Dr. Burr Medico also recommends going to an orthopod if pain does not improve.  Ms Underdown verbalized understanding.

## 2020-03-29 NOTE — Telephone Encounter (Signed)
Casey Baker left vm. I returned her call   she is having joint pain in her legs.  Ibuprofen and acetaminophen no longer help the pain.  She is wanting to know if there is anything else she can take She also states she needs a refill for xanax.

## 2020-04-29 ENCOUNTER — Other Ambulatory Visit: Payer: Self-pay | Admitting: Hematology

## 2020-04-29 DIAGNOSIS — C50412 Malignant neoplasm of upper-outer quadrant of left female breast: Secondary | ICD-10-CM

## 2020-04-30 NOTE — Telephone Encounter (Signed)
Please review for Refill

## 2020-05-02 ENCOUNTER — Other Ambulatory Visit: Payer: Self-pay | Admitting: Hematology

## 2020-05-02 DIAGNOSIS — Z170 Estrogen receptor positive status [ER+]: Secondary | ICD-10-CM

## 2020-05-02 DIAGNOSIS — C50412 Malignant neoplasm of upper-outer quadrant of left female breast: Secondary | ICD-10-CM

## 2020-05-02 MED ORDER — ALPRAZOLAM 0.5 MG PO TABS: 0.5000 mg | ORAL_TABLET | Freq: Every evening | ORAL | 0 refills | Status: DC | PRN

## 2020-05-29 ENCOUNTER — Other Ambulatory Visit: Payer: Self-pay | Admitting: Hematology

## 2020-05-29 DIAGNOSIS — Z170 Estrogen receptor positive status [ER+]: Secondary | ICD-10-CM

## 2020-05-29 NOTE — H&P (Signed)
Subjective:     Patient ID: Casey Baker is a 49 y.o. female.  HPI  15 months post op implant exchange. Plan revision reconstruction this month.   Presented with palpable mass and some bloody nipple discharge in January 2019.Obtained her first MMG revealed calcifications in the left breast and suspicious bloody discharge from the nipple with palpable masses in 2 o'clock position. Spans appr 8 cm over UOQ by [place]. Biopsies labeled left breast 2 o'clock, 6 cfn with low grade DCIS with intraductal papilloma, and left breast  2 o'clock, 10 cfn showedlow grade DCIS, ER/PR +. Final pathology  Microinvasive ductal ca with low grade DCIS, margins clear of invasive ca, anterior margin broadly positive for DCIS, 1/2 SLN+.  Mammaprint insufficient tissue to complete. Dr. Burr Medico discussed possible small benefit of chemo - patient declined. On tamoxifen.  Completed RT 9.9.19.   Genetics negative. MMG 08/2018 9 mm oval asymmetry in the posterior aspect of the upper right breast noted, similar in appearance to an area evaluated on 01/27/2018 and felt to represent normal breast tissue on additional images. Previously described benign anterior right breast milk of calcium is unchanged. US showed a normal appearing island of dense glandular tissue in the far posterior aspect of the right breast in the 10 o'clock position, 8 cmfn, corresponding to the MMG asymmetry.  Prior DDD. Left mastectomy 1104 g Right breast mastopexy 286 g. 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  Review of Systems    Objective:   Physical Exam  Cardiovascular: Normal rate, regular rhythm and normal heart sounds.  Pulmonary/Chest: Effort normal and breath sounds normal.  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  Right appears larger volume than left. Reviewed asymmetry expected with unilateral implant reconstruction, history radiation. Reviewed increased risks with surgery over radiated chest- she underwent implant exchange without complication and cautioned her surgery over left side if any problems healing then may lose reconstruction and have to start over with flap or flap and expander. We agree not to risk this and will keep current implant volume on left. Plan revision right with additional lift and soft tissue excision over lower pole; this will include resection painful hypertrophic portion scar at medial IMF. Plan liposuction and lipofilling left chest. Patient desires abdomen as donor site for lipofilling- has umbilical hernia, plan supra and infraumbilcal abdomen. Plan additional liposuction lateral chest wall to aid with contour.  Plan OP surgery. Reviewed time off work. No drains anticipated. Reviewed post op compression/garments.  Rx for robaxin and oxycodone given  Patient is fully COVID vaccinated. Reviewed will still receive preoperative COVID testing.

## 2020-05-31 ENCOUNTER — Other Ambulatory Visit: Payer: Self-pay | Admitting: Hematology

## 2020-05-31 DIAGNOSIS — Z170 Estrogen receptor positive status [ER+]: Secondary | ICD-10-CM

## 2020-05-31 DIAGNOSIS — C50412 Malignant neoplasm of upper-outer quadrant of left female breast: Secondary | ICD-10-CM

## 2020-05-31 MED ORDER — ALPRAZOLAM 0.5 MG PO TABS: 0.5000 mg | ORAL_TABLET | Freq: Every evening | ORAL | 0 refills | Status: DC | PRN

## 2020-06-07 ENCOUNTER — Other Ambulatory Visit: Payer: Self-pay

## 2020-06-07 ENCOUNTER — Encounter (HOSPITAL_BASED_OUTPATIENT_CLINIC_OR_DEPARTMENT_OTHER): Payer: Self-pay | Admitting: Plastic Surgery

## 2020-06-11 ENCOUNTER — Other Ambulatory Visit (HOSPITAL_COMMUNITY)
Admission: RE | Admit: 2020-06-11 | Discharge: 2020-06-11 | Disposition: A | Discharge status: Home / Self Care | Payer: Medicaid Other | Source: Ambulatory Visit | Attending: Plastic Surgery | Admitting: Plastic Surgery

## 2020-06-11 ENCOUNTER — Encounter (HOSPITAL_BASED_OUTPATIENT_CLINIC_OR_DEPARTMENT_OTHER)
Admission: RE | Admit: 2020-06-11 | Discharge: 2020-06-11 | Disposition: A | Discharge status: Home / Self Care | Payer: Medicaid Other | Source: Ambulatory Visit | Attending: Plastic Surgery | Admitting: Plastic Surgery

## 2020-06-11 DIAGNOSIS — Z20822 Contact with and (suspected) exposure to covid-19: Secondary | ICD-10-CM | POA: Insufficient documentation

## 2020-06-11 DIAGNOSIS — Z01812 Encounter for preprocedural laboratory examination: Secondary | ICD-10-CM | POA: Diagnosis present

## 2020-06-11 DIAGNOSIS — Z01810 Encounter for preprocedural cardiovascular examination: Secondary | ICD-10-CM | POA: Insufficient documentation

## 2020-06-11 LAB — SARS CORONAVIRUS 2 (TAT 6-24 HRS): SARS Coronavirus 2: NEGATIVE

## 2020-06-11 NOTE — Progress Notes (Signed)

## 2020-06-14 ENCOUNTER — Encounter (HOSPITAL_BASED_OUTPATIENT_CLINIC_OR_DEPARTMENT_OTHER)
Admission: RE | Disposition: A | Discharge status: Home / Self Care | Payer: Self-pay | Source: Home / Self Care | Attending: Plastic Surgery

## 2020-06-14 ENCOUNTER — Other Ambulatory Visit: Payer: Self-pay

## 2020-06-14 ENCOUNTER — Encounter (HOSPITAL_BASED_OUTPATIENT_CLINIC_OR_DEPARTMENT_OTHER): Payer: Self-pay | Admitting: Plastic Surgery

## 2020-06-14 ENCOUNTER — Ambulatory Visit (HOSPITAL_BASED_OUTPATIENT_CLINIC_OR_DEPARTMENT_OTHER)
Admission: RE | Admit: 2020-06-14 | Discharge: 2020-06-14 | Disposition: A | Discharge status: Home / Self Care | Payer: Medicaid Other | Attending: Plastic Surgery | Admitting: Plastic Surgery

## 2020-06-14 ENCOUNTER — Ambulatory Visit (HOSPITAL_BASED_OUTPATIENT_CLINIC_OR_DEPARTMENT_OTHER): Payer: Medicaid Other | Admitting: Certified Registered Nurse Anesthetist

## 2020-06-14 DIAGNOSIS — Z803 Family history of malignant neoplasm of breast: Secondary | ICD-10-CM | POA: Diagnosis not present

## 2020-06-14 DIAGNOSIS — Z923 Personal history of irradiation: Secondary | ICD-10-CM | POA: Insufficient documentation

## 2020-06-14 DIAGNOSIS — F329 Major depressive disorder, single episode, unspecified: Secondary | ICD-10-CM | POA: Insufficient documentation

## 2020-06-14 DIAGNOSIS — K429 Umbilical hernia without obstruction or gangrene: Secondary | ICD-10-CM | POA: Insufficient documentation

## 2020-06-14 DIAGNOSIS — Z79810 Long term (current) use of selective estrogen receptor modulators (SERMs): Secondary | ICD-10-CM | POA: Diagnosis not present

## 2020-06-14 DIAGNOSIS — Z79899 Other long term (current) drug therapy: Secondary | ICD-10-CM | POA: Diagnosis not present

## 2020-06-14 DIAGNOSIS — N6489 Other specified disorders of breast: Secondary | ICD-10-CM | POA: Insufficient documentation

## 2020-06-14 DIAGNOSIS — I10 Essential (primary) hypertension: Secondary | ICD-10-CM | POA: Insufficient documentation

## 2020-06-14 DIAGNOSIS — Z9012 Acquired absence of left breast and nipple: Secondary | ICD-10-CM | POA: Diagnosis not present

## 2020-06-14 DIAGNOSIS — Z853 Personal history of malignant neoplasm of breast: Secondary | ICD-10-CM | POA: Insufficient documentation

## 2020-06-14 DIAGNOSIS — F419 Anxiety disorder, unspecified: Secondary | ICD-10-CM | POA: Insufficient documentation

## 2020-06-14 DIAGNOSIS — E669 Obesity, unspecified: Secondary | ICD-10-CM | POA: Insufficient documentation

## 2020-06-14 HISTORY — PX: LIPOSUCTION WITH LIPOFILLING: SHX6436

## 2020-06-14 HISTORY — PX: BREAST REDUCTION SURGERY: SHX8

## 2020-06-14 SURGERY — MAMMOPLASTY, REDUCTION
Anesthesia: General | Site: Chest | Laterality: Right

## 2020-06-14 MED ORDER — FENTANYL CITRATE (PF) 100 MCG/2ML IJ SOLN
INTRAMUSCULAR | Status: DC | PRN
  Administered 2020-06-14: 25 ug via INTRAVENOUS
  Administered 2020-06-14: 50 ug via INTRAVENOUS
  Administered 2020-06-14 (×2): 25 ug via INTRAVENOUS

## 2020-06-14 MED ORDER — GLYCOPYRROLATE PF 0.2 MG/ML IJ SOSY
PREFILLED_SYRINGE | INTRAMUSCULAR | Status: AC
  Filled 2020-06-14: qty 1

## 2020-06-14 MED ORDER — DROPERIDOL 2.5 MG/ML IJ SOLN
INTRAMUSCULAR | Status: DC | PRN
  Administered 2020-06-14: .625 mg via INTRAVENOUS

## 2020-06-14 MED ORDER — LACTATED RINGERS IV SOLN: INTRAVENOUS | Status: DC

## 2020-06-14 MED ORDER — DEXAMETHASONE SODIUM PHOSPHATE 4 MG/ML IJ SOLN
INTRAMUSCULAR | Status: DC | PRN
  Administered 2020-06-14: 5 mg via INTRAVENOUS

## 2020-06-14 MED ORDER — ONDANSETRON HCL 4 MG/2ML IJ SOLN
INTRAMUSCULAR | Status: AC
  Filled 2020-06-14: qty 2

## 2020-06-14 MED ORDER — LIDOCAINE HCL (PF) 1 % IJ SOLN
INTRAMUSCULAR | Status: AC
  Filled 2020-06-14: qty 60

## 2020-06-14 MED ORDER — ONDANSETRON HCL 4 MG/2ML IJ SOLN
INTRAMUSCULAR | Status: DC | PRN
  Administered 2020-06-14: 4 mg via INTRAVENOUS

## 2020-06-14 MED ORDER — FENTANYL CITRATE (PF) 100 MCG/2ML IJ SOLN
INTRAMUSCULAR | Status: AC
  Filled 2020-06-14: qty 2

## 2020-06-14 MED ORDER — LIDOCAINE HCL (CARDIAC) PF 100 MG/5ML IV SOSY
PREFILLED_SYRINGE | INTRAVENOUS | Status: DC | PRN
  Administered 2020-06-14: 60 mg via INTRAVENOUS

## 2020-06-14 MED ORDER — CEFAZOLIN SODIUM-DEXTROSE 2-4 GM/100ML-% IV SOLN
2.0000 g | INTRAVENOUS | Status: AC
  Administered 2020-06-14: 2 g via INTRAVENOUS

## 2020-06-14 MED ORDER — CHLORHEXIDINE GLUCONATE CLOTH 2 % EX PADS: 6.0000 | MEDICATED_PAD | Freq: Once | CUTANEOUS | Status: DC

## 2020-06-14 MED ORDER — OXYCODONE HCL 5 MG/5ML PO SOLN: 5.0000 mg | Freq: Once | ORAL | Status: DC | PRN

## 2020-06-14 MED ORDER — HYDROMORPHONE HCL 1 MG/ML IJ SOLN
INTRAMUSCULAR | Status: AC
  Filled 2020-06-14: qty 0.5

## 2020-06-14 MED ORDER — CEFAZOLIN SODIUM-DEXTROSE 2-4 GM/100ML-% IV SOLN
INTRAVENOUS | Status: AC
  Filled 2020-06-14: qty 100

## 2020-06-14 MED ORDER — CELECOXIB 200 MG PO CAPS
ORAL_CAPSULE | ORAL | Status: AC
  Filled 2020-06-14: qty 1

## 2020-06-14 MED ORDER — SODIUM BICARBONATE 4.2 % IV SOLN
INTRAVENOUS | Status: AC
  Filled 2020-06-14: qty 20

## 2020-06-14 MED ORDER — MIDAZOLAM HCL 5 MG/5ML IJ SOLN
INTRAMUSCULAR | Status: DC | PRN
  Administered 2020-06-14: 2 mg via INTRAVENOUS

## 2020-06-14 MED ORDER — OXYCODONE HCL 5 MG PO TABS: 5.0000 mg | ORAL_TABLET | Freq: Once | ORAL | Status: DC | PRN

## 2020-06-14 MED ORDER — ACETAMINOPHEN 500 MG PO TABS
ORAL_TABLET | ORAL | Status: AC
  Filled 2020-06-14: qty 2

## 2020-06-14 MED ORDER — LIDOCAINE 2% (20 MG/ML) 5 ML SYRINGE
INTRAMUSCULAR | Status: AC
  Filled 2020-06-14: qty 5

## 2020-06-14 MED ORDER — PROMETHAZINE HCL 25 MG/ML IJ SOLN: 6.2500 mg | INTRAMUSCULAR | Status: DC | PRN

## 2020-06-14 MED ORDER — SCOPOLAMINE 1 MG/3DAYS TD PT72
MEDICATED_PATCH | TRANSDERMAL | Status: AC
  Filled 2020-06-14: qty 1

## 2020-06-14 MED ORDER — 0.9 % SODIUM CHLORIDE (POUR BTL) OPTIME
TOPICAL | Status: DC | PRN
  Administered 2020-06-14: 1000 mL

## 2020-06-14 MED ORDER — SODIUM BICARBONATE 4 % IV SOLN
INTRAVENOUS | Status: DC | PRN
  Administered 2020-06-14: 400 mL via INTRAMUSCULAR

## 2020-06-14 MED ORDER — CELECOXIB 200 MG PO CAPS
200.0000 mg | ORAL_CAPSULE | ORAL | Status: AC
  Administered 2020-06-14: 200 mg via ORAL

## 2020-06-14 MED ORDER — MEPERIDINE HCL 25 MG/ML IJ SOLN: 6.2500 mg | INTRAMUSCULAR | Status: DC | PRN

## 2020-06-14 MED ORDER — PROPOFOL 10 MG/ML IV BOLUS
INTRAVENOUS | Status: AC
  Filled 2020-06-14: qty 20

## 2020-06-14 MED ORDER — SCOPOLAMINE 1 MG/3DAYS TD PT72
1.0000 | MEDICATED_PATCH | Freq: Once | TRANSDERMAL | Status: DC
  Administered 2020-06-14: 1.5 mg via TRANSDERMAL

## 2020-06-14 MED ORDER — GABAPENTIN 300 MG PO CAPS
300.0000 mg | ORAL_CAPSULE | ORAL | Status: AC
  Administered 2020-06-14: 300 mg via ORAL

## 2020-06-14 MED ORDER — MIDAZOLAM HCL 2 MG/2ML IJ SOLN
INTRAMUSCULAR | Status: AC
  Filled 2020-06-14: qty 2

## 2020-06-14 MED ORDER — HYDROMORPHONE HCL 1 MG/ML IJ SOLN
0.2500 mg | INTRAMUSCULAR | Status: DC | PRN
  Administered 2020-06-14: 0.5 mg via INTRAVENOUS

## 2020-06-14 MED ORDER — ROCURONIUM BROMIDE 10 MG/ML (PF) SYRINGE
PREFILLED_SYRINGE | INTRAVENOUS | Status: AC
  Filled 2020-06-14: qty 10

## 2020-06-14 MED ORDER — ACETAMINOPHEN 500 MG PO TABS
1000.0000 mg | ORAL_TABLET | ORAL | Status: AC
  Administered 2020-06-14: 1000 mg via ORAL

## 2020-06-14 MED ORDER — PROPOFOL 10 MG/ML IV BOLUS
INTRAVENOUS | Status: DC | PRN
  Administered 2020-06-14: 200 mg via INTRAVENOUS

## 2020-06-14 MED ORDER — DROPERIDOL 2.5 MG/ML IJ SOLN
INTRAMUSCULAR | Status: AC
  Filled 2020-06-14: qty 2

## 2020-06-14 MED ORDER — GABAPENTIN 300 MG PO CAPS
ORAL_CAPSULE | ORAL | Status: AC
  Filled 2020-06-14: qty 1

## 2020-06-14 MED ORDER — GLYCOPYRROLATE 0.2 MG/ML IJ SOLN
INTRAMUSCULAR | Status: DC | PRN
  Administered 2020-06-14: .1 mg via INTRAVENOUS

## 2020-06-14 MED ORDER — EPINEPHRINE PF 1 MG/ML IJ SOLN
INTRAMUSCULAR | Status: AC
  Filled 2020-06-14: qty 1

## 2020-06-14 SURGICAL SUPPLY — 81 items
ADH SKN CLS APL DERMABOND .7 (GAUZE/BANDAGES/DRESSINGS) ×6
APL PRP STRL LF DISP 70% ISPRP (MISCELLANEOUS) ×6
APPLIER CLIP 9.375 MED OPEN (MISCELLANEOUS)
APR CLP MED 9.3 20 MLT OPN (MISCELLANEOUS)
BINDER ABDOMINAL 10 UNV 27-48 (MISCELLANEOUS) ×5 IMPLANT
BINDER ABDOMINAL 12 SM 30-45 (SOFTGOODS) IMPLANT
BINDER BREAST 3XL (GAUZE/BANDAGES/DRESSINGS) IMPLANT
BINDER BREAST LRG (GAUZE/BANDAGES/DRESSINGS) IMPLANT
BINDER BREAST MEDIUM (GAUZE/BANDAGES/DRESSINGS) IMPLANT
BINDER BREAST XLRG (GAUZE/BANDAGES/DRESSINGS) IMPLANT
BINDER BREAST XXLRG (GAUZE/BANDAGES/DRESSINGS) ×5 IMPLANT
BLADE SURG 10 STRL SS (BLADE) ×5 IMPLANT
BLADE SURG 11 STRL SS (BLADE) ×5 IMPLANT
BNDG GAUZE ELAST 4 BULKY (GAUZE/BANDAGES/DRESSINGS) ×10 IMPLANT
CANISTER LIPO FAT HARVEST (MISCELLANEOUS) ×5 IMPLANT
CANISTER SUCT 1200ML W/VALVE (MISCELLANEOUS) ×5 IMPLANT
CHLORAPREP W/TINT 26 (MISCELLANEOUS) ×10 IMPLANT
CLIP APPLIE 9.375 MED OPEN (MISCELLANEOUS) IMPLANT
CLIP VESOCCLUDE MED 6/CT (CLIP) IMPLANT
COVER BACK TABLE 60X90IN (DRAPES) ×5 IMPLANT
COVER MAYO STAND STRL (DRAPES) ×10 IMPLANT
COVER WAND RF STERILE (DRAPES) IMPLANT
DECANTER SPIKE VIAL GLASS SM (MISCELLANEOUS) IMPLANT
DERMABOND ADVANCED (GAUZE/BANDAGES/DRESSINGS) ×4
DERMABOND ADVANCED .7 DNX12 (GAUZE/BANDAGES/DRESSINGS) ×6 IMPLANT
DRAIN CHANNEL 15F RND FF W/TCR (WOUND CARE) IMPLANT
DRAPE TOP ARMCOVERS (MISCELLANEOUS) ×5 IMPLANT
DRAPE U-SHAPE 76X120 STRL (DRAPES) ×5 IMPLANT
DRAPE UTILITY XL STRL (DRAPES) ×10 IMPLANT
DRSG PAD ABDOMINAL 8X10 ST (GAUZE/BANDAGES/DRESSINGS) ×20 IMPLANT
ELECT COATED BLADE 2.86 ST (ELECTRODE) ×5 IMPLANT
ELECT REM PT RETURN 9FT ADLT (ELECTROSURGICAL) ×5
ELECTRODE REM PT RTRN 9FT ADLT (ELECTROSURGICAL) ×3 IMPLANT
EVACUATOR SILICONE 100CC (DRAIN) IMPLANT
EXTRACTOR CANIST REVOLVE STRL (CANNISTER) IMPLANT
GLOVE BIO SURGEON STRL SZ 6 (GLOVE) ×10 IMPLANT
GLOVE BIOGEL PI IND STRL 6.5 (GLOVE) ×3 IMPLANT
GLOVE BIOGEL PI IND STRL 7.0 (GLOVE) ×3 IMPLANT
GLOVE BIOGEL PI INDICATOR 6.5 (GLOVE) ×2
GLOVE BIOGEL PI INDICATOR 7.0 (GLOVE) ×2
GLOVE ECLIPSE 6.5 STRL STRAW (GLOVE) ×5 IMPLANT
GOWN STRL REUS W/ TWL LRG LVL3 (GOWN DISPOSABLE) ×6 IMPLANT
GOWN STRL REUS W/TWL LRG LVL3 (GOWN DISPOSABLE) ×10
LINER CANISTER 1000CC FLEX (MISCELLANEOUS) ×5 IMPLANT
MARKER SKIN DUAL TIP RULER LAB (MISCELLANEOUS) IMPLANT
NDL SAFETY ECLIPSE 18X1.5 (NEEDLE) ×9 IMPLANT
NEEDLE HYPO 18GX1.5 SHARP (NEEDLE) ×15
NEEDLE HYPO 25X1 1.5 SAFETY (NEEDLE) IMPLANT
NS IRRIG 1000ML POUR BTL (IV SOLUTION) ×5 IMPLANT
PACK BASIN DAY SURGERY FS (CUSTOM PROCEDURE TRAY) ×5 IMPLANT
PAD ALCOHOL SWAB (MISCELLANEOUS) ×20 IMPLANT
PENCIL SMOKE EVACUATOR (MISCELLANEOUS) ×5 IMPLANT
PIN SAFETY STERILE (MISCELLANEOUS) IMPLANT
SHEET MEDIUM DRAPE 40X70 STRL (DRAPES) ×10 IMPLANT
SLEEVE SCD COMPRESS KNEE MED (MISCELLANEOUS) ×5 IMPLANT
SPONGE LAP 18X18 RF (DISPOSABLE) ×10 IMPLANT
STAPLER VISISTAT 35W (STAPLE) ×5 IMPLANT
SUT ETHILON 2 0 FS 18 (SUTURE) IMPLANT
SUT MNCRL AB 4-0 PS2 18 (SUTURE) ×10 IMPLANT
SUT PDS AB 2-0 CT2 27 (SUTURE) IMPLANT
SUT VIC AB 3-0 PS1 18 (SUTURE) ×10
SUT VIC AB 3-0 PS1 18XBRD (SUTURE) ×6 IMPLANT
SUT VIC AB 3-0 SH 27 (SUTURE)
SUT VIC AB 3-0 SH 27X BRD (SUTURE) IMPLANT
SUT VICRYL 4-0 PS2 18IN ABS (SUTURE) ×5 IMPLANT
SYR 10ML LL (SYRINGE) ×25 IMPLANT
SYR 50ML LL SCALE MARK (SYRINGE) ×25 IMPLANT
SYR BULB IRRIG 60ML STRL (SYRINGE) IMPLANT
SYR CONTROL 10ML LL (SYRINGE) IMPLANT
SYR TB 1ML LL NO SAFETY (SYRINGE) ×5 IMPLANT
SYR TOOMEY IRRIG 70ML (MISCELLANEOUS)
SYRINGE TOOMEY IRRIG 70ML (MISCELLANEOUS) IMPLANT
TAPE MEASURE VINYL STERILE (MISCELLANEOUS) IMPLANT
TOWEL GREEN STERILE FF (TOWEL DISPOSABLE) ×10 IMPLANT
TRAY FOLEY W/BAG SLVR 14FR LF (SET/KITS/TRAYS/PACK) IMPLANT
TUBE CONNECTING 20'X1/4 (TUBING) ×1
TUBE CONNECTING 20X1/4 (TUBING) ×4 IMPLANT
TUBING INFILTRATION IT-10001 (TUBING) ×5 IMPLANT
TUBING SET GRADUATE ASPIR 12FT (MISCELLANEOUS) ×5 IMPLANT
UNDERPAD 30X36 HEAVY ABSORB (UNDERPADS AND DIAPERS) ×10 IMPLANT
YANKAUER SUCT BULB TIP NO VENT (SUCTIONS) ×5 IMPLANT

## 2020-06-14 NOTE — Discharge Instructions (Signed)
  Post Anesthesia Home Care Instructions  Activity: Get plenty of rest for the remainder of the day. A responsible individual must stay with you for 24 hours following the procedure.  For the next 24 hours, DO NOT: -Drive a car -Paediatric nurse -Drink alcoholic beverages -Take any medication unless instructed by your physician -Make any legal decisions or sign important papers.  Meals: Start with liquid foods such as gelatin or soup. Progress to regular foods as tolerated. Avoid greasy, spicy, heavy foods. If nausea and/or vomiting occur, drink only clear liquids until the nausea and/or vomiting subsides. Call your physician if vomiting continues.  Special Instructions/Symptoms: Your throat may feel dry or sore from the anesthesia or the breathing tube placed in your throat during surgery. If this causes discomfort, gargle with warm salt water. The discomfort should disappear within 24 hours.  If you had a scopolamine patch placed behind your ear for the management of post- operative nausea and/or vomiting:  1. The medication in the patch is effective for 72 hours, after which it should be removed.  Wrap patch in a tissue and discard in the trash. Wash hands thoroughly with soap and water. 2. You may remove the patch earlier than 72 hours if you experience unpleasant side effects which may include dry mouth, dizziness or visual disturbances. 3. Avoid touching the patch. Wash your hands with soap and water after contact with the patch.   Next dose of Tylenol may be given at 3:30p

## 2020-06-14 NOTE — Anesthesia Procedure Notes (Signed)
Procedure Name: LMA Insertion Date/Time: 06/14/2020 10:13 AM Performed by: Bufford Spikes, CRNA Pre-anesthesia Checklist: Patient identified, Emergency Drugs available, Suction available and Patient being monitored Patient Re-evaluated:Patient Re-evaluated prior to induction Oxygen Delivery Method: Circle system utilized Preoxygenation: Pre-oxygenation with 100% oxygen Induction Type: IV induction Ventilation: Mask ventilation without difficulty LMA: LMA inserted LMA Size: 4.0 Number of attempts: 1 Placement Confirmation: positive ETCO2 Tube secured with: Tape Dental Injury: Teeth and Oropharynx as per pre-operative assessment

## 2020-06-14 NOTE — Transfer of Care (Signed)
Immediate Anesthesia Transfer of Care Note  Patient: Casey Baker  Procedure(s) Performed: REVISION RIGHT BREAST REDUCTION (Right Breast) LIPOSUCTION BILATERAL CHEST WALL AND ABDOMEN; LIPOFILLING LEFT CHEST (Bilateral Chest)  Patient Location: PACU  Anesthesia Type:General  Level of Consciousness: awake, alert  and oriented  Airway & Oxygen Therapy: Patient Spontanous Breathing and Patient connected to nasal cannula oxygen  Post-op Assessment: Report given to RN and Post -op Vital signs reviewed and stable  Post vital signs: Reviewed and stable  Last Vitals:  Vitals Value Taken Time  BP 119/86 06/14/20 1205  Temp    Pulse 106 06/14/20 1206  Resp 18 06/14/20 1206  SpO2 100 % 06/14/20 1206  Vitals shown include unvalidated device data.  Last Pain:  Vitals:   06/14/20 0919  TempSrc: Oral  PainSc: 0-No pain      Patients Stated Pain Goal: 6 ([id number])  Complications: No complications documented.

## 2020-06-14 NOTE — Anesthesia Postprocedure Evaluation (Signed)
Anesthesia Post Note  Patient: Casey Baker  Procedure(s) Performed: REVISION RIGHT BREAST REDUCTION (Right Breast) LIPOSUCTION BILATERAL CHEST WALL AND ABDOMEN; LIPOFILLING LEFT CHEST (Bilateral Chest)     Patient location during evaluation: PACU Anesthesia Type: General Level of consciousness: awake and alert Pain management: pain level controlled Vital Signs Assessment: post-procedure vital signs reviewed and stable Respiratory status: spontaneous breathing, nonlabored ventilation and respiratory function stable Cardiovascular status: blood pressure returned to baseline and stable Postop Assessment: no apparent nausea or vomiting Anesthetic complications: no   No complications documented.  Last Vitals:  Vitals:   06/14/20 1300 06/14/20 1331  BP: 125/76 119/73  Pulse: 96 (!) 101  Resp: 14   Temp:  36.6 C  SpO2: 95% 99%    Last Pain:  Vitals:   06/14/20 1331  TempSrc: Oral  PainSc: 2                  Lynda Rainwater

## 2020-06-14 NOTE — Op Note (Signed)
Operative Note   DATE OF OPERATION: 8.27.21  LOCATION: [facility]-outpatient  SURGICAL DIVISION: Plastic Surgery  PREOPERATIVE DIAGNOSES:  1. History breast cancer 2. Acquired absence left breast 3. History therapeutic radiation 4. Post operative asymmetry breasts  POSTOPERATIVE DIAGNOSES:  same  PROCEDURE:  1. Revision right breast reduction 2. Lipofilling to left chest 85 ml  SURGEON: [name] MD MBA  ASSISTANT: none  ANESTHESIA:  General.   EBL: 50 ml  COMPLICATIONS: None immediate.   INDICATIONS FOR PROCEDURE:  The patient, Casey Baker, is a 49 y.o. female born on Mar 01, 1971, is here for revision breast reconstruction. Patient underwent left skin reduction mastectomy with prepectoral implant based reconstruction, right breast reduction. Presents with asymmetry shape and volume. Plan revision right breast reduction. Plan liposuction chest wall bilateral and abdomen for improvement contour and lipofilling left chest.   FINDINGS: Total 85 ml fat infiltrated left chest. Right breast tissue excised over lower pole 74 g.  DESCRIPTION OF PROCEDURE:  The patient's operative site was marked with the patient in the preoperative area including elliptical excision right lower pole breast tissue, supra and infra umbilical abdomen for liposuction and lateral chest wall bilateral for liposuction. The patient was taken to the operating room. SCDs were placed and IV antibiotics were given. The patient's operative site was prepped and draped in a sterile fashion. A time out was performed and all information was confirmed to be correct. Stab incision made in prior scar bilateral abdomen, left chest drain scar and right breast lateral extent IMF scar for tumescent infiltration. Total 400 ml tumescent infiltrated.  Over right breast, transversely oriented elliptical skin excision lower pole tissue designed and excised. Through lateral extent of this incision, liposuction performed  over right lateral chest wall. Wound irrigated and hemostasis obtained. Closure completed with 3-0 vicryl in dermis and running 4-0 monocryl subcuticular skin closure. Dermabond applied.  Power assisted liposuction then performed over supra and infraumbilical abdomen, left lateral chest wall. Total lipoaspirate approximately 275 ml. Fat then washed and prepared by gravity. Total 85 ml fat infiltrated in subcutaneous plane over left chest through stab incisions. These incisions closed with 4-0 monocryl. Liposuction port sites closed with 4-0 monocryl simple suture.  Dry dressing, breast and abdominal binders applied. The patient was allowed to wake from anesthesia, extubated and taken to the recovery room in satisfactory condition.   SPECIMENS: right breast reduction  DRAINS: none

## 2020-06-14 NOTE — Interval H&P Note (Signed)
History and Physical Interval Note:  06/14/2020 9:29 AM  Casey Baker  has presented today for surgery, with the diagnosis of history breast cancer, acquired absence breast, history therapeutic radiation.  The various methods of treatment have been discussed with the patient and family. After consideration of risks, benefits and other options for treatment, the patient has consented to  Procedure(s): REVISION RIGHT BREAST REDUCTION (Right) LIPOSUCTION BILATERAL CHEST WALL (Bilateral) FAT GRAFTING TO LEFT CHEST (Left) as a surgical intervention.  The patient's history has been reviewed, patient examined, no change in status, stable for surgery.  I have reviewed the patient's chart and labs.  Questions were answered to the patient's satisfaction.    Flexeril substituted for Robaxin for post op pain control  [employer]

## 2020-06-14 NOTE — Anesthesia Preprocedure Evaluation (Signed)
Anesthesia Evaluation  Patient identified by MRN, date of birth, ID band Patient awake    Reviewed: Allergy & Precautions, NPO status , Patient's Chart, lab work & pertinent test results  History of Anesthesia Complications (+) PONV  Airway Mallampati: II  TM Distance: >3 FB Neck ROM: Full    Dental no notable dental hx. (+) Upper Dentures   Pulmonary neg pulmonary ROS,    Pulmonary exam normal breath sounds clear to auscultation       Cardiovascular hypertension, Pt. on medications Normal cardiovascular exam Rhythm:Regular Rate:Normal     Neuro/Psych Anxiety Depression negative neurological ROS  negative psych ROS   GI/Hepatic negative GI ROS, Neg liver ROS,   Endo/Other  negative endocrine ROS  Renal/GU negative Renal ROS  negative genitourinary   Musculoskeletal negative musculoskeletal ROS (+)   Abdominal (+) + obese,   Peds negative pediatric ROS (+)  Hematology negative hematology ROS (+)   Anesthesia Other Findings   Reproductive/Obstetrics negative OB ROS                             Anesthesia Physical  Anesthesia Plan  ASA: II  Anesthesia Plan: General   Post-op Pain Management:    Induction: Intravenous  PONV Risk Score and Plan: 4 or greater and Ondansetron, Dexamethasone, Midazolam, Treatment may vary due to age or medical condition and Droperidol  Airway Management Planned: LMA and Oral ETT  Additional Equipment:   Intra-op Plan:   Post-operative Plan: Extubation in OR  Informed Consent: I have reviewed the patients History and Physical, chart, labs and discussed the procedure including the risks, benefits and alternatives for the proposed anesthesia with the patient or authorized representative who has indicated his/her understanding and acceptance.     Dental advisory given  Plan Discussed with: CRNA  Anesthesia Plan Comments:          Anesthesia Quick Evaluation

## 2020-06-17 ENCOUNTER — Encounter (HOSPITAL_BASED_OUTPATIENT_CLINIC_OR_DEPARTMENT_OTHER): Payer: Self-pay | Admitting: Plastic Surgery

## 2020-06-17 LAB — SURGICAL PATHOLOGY

## 2020-07-01 ENCOUNTER — Other Ambulatory Visit: Payer: Self-pay | Admitting: Hematology

## 2020-07-01 DIAGNOSIS — Z170 Estrogen receptor positive status [ER+]: Secondary | ICD-10-CM

## 2020-07-03 ENCOUNTER — Other Ambulatory Visit: Payer: Self-pay | Admitting: Hematology

## 2020-07-03 DIAGNOSIS — Z170 Estrogen receptor positive status [ER+]: Secondary | ICD-10-CM

## 2020-07-03 MED ORDER — ALPRAZOLAM 0.5 MG PO TABS: 0.5000 mg | ORAL_TABLET | Freq: Every evening | ORAL | 0 refills | Status: DC | PRN

## 2020-08-08 ENCOUNTER — Other Ambulatory Visit: Payer: Self-pay | Admitting: Hematology

## 2020-08-08 DIAGNOSIS — C50412 Malignant neoplasm of upper-outer quadrant of left female breast: Secondary | ICD-10-CM

## 2020-08-09 ENCOUNTER — Other Ambulatory Visit: Payer: Self-pay

## 2020-08-09 NOTE — Telephone Encounter (Signed)
For your review

## 2020-08-09 NOTE — Progress Notes (Signed)
Checking pt xanax refill request  Request forwarded to provider

## 2020-08-09 NOTE — Progress Notes (Signed)
[place]   Telephone:(336) [phone] Fax:(336) [phone]   Clinic Follow up Note   Patient Care Team: Patient, [name] - General (General Practice) Excell Seltzer, MD (Inactive) as Consulting Physician (General Surgery) Truitt Merle, MD as Consulting Physician (Hematology) Gery Pray, MD as Consulting Physician (Radiation Oncology) Alla Feeling, NP as Nurse Practitioner (Nurse Practitioner)  Date of Service:  08/14/2020  CHIEF COMPLAINT: F/u on left breastcancer  SUMMARY OF ONCOLOGIC HISTORY: Oncology History Overview Note  Cancer Staging Ductal carcinoma in situ (DCIS) of left breast Staging form: Breast, AJCC 8th Edition - Clinical stage from 02/09/2018: Stage 0 (cTis (DCIS), cN0, cM0, ER+, PR+) - Unsigned - Pathologic stage from 03/18/2018: Stage IA (pT62m, pN1a, cM0, G1, ER+, PR+, HER2-) - Signed by [name] on 03/28/2018     Malignant neoplasm of upper-outer quadrant of left female breast ([place]  01/27/2018 Mammogram   IMPRESSION: Suspicious bloody left nipple discharge with palpable masses in the 2 o'clock axis of the left breast. Findings are suspicious for Malignancy. ADDENDUM: Magnification views of the retroareolar right breast were performed to evaluate calcifications seen on the patient's baseline mammogram. There are loosely grouped and scattered calcifications in the outer right breast, superior to and directly posterior to the nipple. These calcifications are rounded and smudgy in the CC projection, and many of them demonstrate layering on the 90 degree lateral magnification view, consistent with benign milk of calcium. No suspicious microcalcifications are seen in the right breast.   01/31/2018 Initial Biopsy   Diagnosis 01/31/18 1. 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 - NEGATIVE FOR INVASIVE CARCINOMA. 2. Breast, left,  needle core biopsy, 2 o'clock, 10 cfn - LOW GRADE DUCTAL CARCINOMA IN SITU (DCIS) PARTIALLY INVOLVING AN INTRADUCTAL PAPILLOMA. - NEGATIVE FOR INVASIVE CARCINOMA.   01/31/2018 Receptors her2   Prognostic indicators significant for: ER, 100% positive and PR, 100% positive, both with strong staining intensity.    02/03/2018 Initial Diagnosis   Ductal carcinoma in situ (DCIS) of left breast   02/10/2018 Genetic Testing   Testing did not reveal a pathogenic mutation in any of the genes analyzed.A copy of the genetic test report will be scanned into Epic under the Media tab. The genes analyzed were the 23 genes on [facility]'s Breast/GYN panel (ATM, BARD1, BRCA1, BRCA2, BRIP1, CDH1, CHEK2, DICER1, EPCAM, MLH1,  MSH2, MSH6, NBN, NF1, PALB2, PMS2, PTEN, RAD50, RAD51C, RAD51D,SMARCA4, STK11, and TP53).  Genetic testing involved analysis of 9 genes: ATM, BRCA1, BRCA2, CDH1, CHEK2, PALB2, PTEN, STK11 and TP53 genes. Testing was normal and did not reveal a mutation in these genes.     03/18/2018 Surgery   TOTAL LEFT MASTECTOMY WITH AXILLARY SENTINEL LYMPH NODE BIOPSY and  LEFT BREAST RECONSTRUCTION WITH PLACEMENT OF TISSUE EXPANDER AND FLEX HD (ACELLULAR HYDRATED DERMIS) by Dr. HExcell Seltzerand Dr. [name] 03/18/18   03/18/2018 Pathology Results   Diagnosis 03/18/18 1. Lymph node, sentinel, biopsy, Left Axillary #1 - METASTATIC CARCINOMA IN ONE OF ONE LYMPH NODES (1/1). 2. Lymph node, sentinel, biopsy, Left Axillary #2 - ONE OF ONE LYMPH NODES NEGATIVE FOR CARCINOMA (0/1). 3. Breast, simple mastectomy, Left Total - MICROINVASIVE DUCTAL CARCINOMA ARISING IN A BACKGROUND OF LOW GRADE DUCTAL CARCINOMA IN SITU. - DUCTAL CARCINOMA IN SITU PARTIALLY INVOLVES AN INTRADUCTAL PAPILLOMA. - RESECTION MARGINS ARE NEGATIVE FOR INVASIVE CARCINOMA. - IN SITU CARCINOMA IS PRESENT AT THE ANTERIOR  MARGIN BROADLY. - BIOPSY SITE. - SEE ONCOLOGY TABLE.    03/18/2018 Receptors her2   Estrogen Receptor: 100%,  POSITIVE, MODERATE-WEAK STAINING INTENSITY Progesterone Receptor: 100%, POSITIVE, STRONG STAINING INTENSITY Proliferation Marker Ki67: 1% HER2 - NEGATIVE   03/18/2018 Cancer Staging   Staging form: Breast, AJCC 8th Edition - Pathologic stage from 03/18/2018: Stage IA (pT48m, pN1a, cM0, G1, ER+, PR+, HER2-) - Signed by [name] on 03/28/2018   05/18/2018 - 06/27/2018 Radiation Therapy   Adjuvant radiation by Dr. KSondra Come   06/27/2018 Mammogram   06/27/2018 Mammogram IMPRESSION: Benign appearing right breast calcifications.  Recommendation: Screening right mammogram is suggested in April 2020.   07/19/2018 -  Anti-estrogen oral therapy   Adjuvant tamoxifen     Survivorship   Per LCira Rue NP    03/07/2019 Surgery   REMOVAL OF LEFT TISSUE EXPANDER AND PLACEMENT OF SILICONE IMPLANT and LIPOFILLING FROM ABDOMEN TO LEFT CHEST and RIGHT BREAST MASTOPEXY by Dr. [name] 03/07/19   06/14/2020 Surgery   REVISION RIGHT BREAST REDUCTION and LIPOSUCTION BILATERAL CHEST WALL AND ABDOMEN; LIPOFILLING LEFT CHEST by Dr [name]      CURRENT THERAPY:  AdjuvantTamoxifen, started in early Oct 2019  INTERVAL HISTORY:  Casey MAZARIEGOis here for a follow up of left breast cancer. She was last seen by me 6 months ago. She presents to the clinic alone. She notes she is doing well. She denies any new major changes. She notes she is tolerating Tamoxifen well. She notes hot flashes are manageable. 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 Her anxiety has increased. She notes Xanax has not helped her recently and impacting her sleep. She had another revision surgery on 06/14/20 with Dr [name]    REVIEW OF SYSTEMS:   Constitutional: Denies fevers, chills or abnormal weight loss (+) hot flashes, manageable (+) trouble sleeping  Eyes: Denies blurriness of vision Ears, nose, mouth, throat, and face: Denies mucositis or sore  throat Respiratory: Denies cough, dyspnea or wheezes Cardiovascular: Denies palpitation, chest discomfort or lower extremity swelling Gastrointestinal:  Denies nausea, heartburn or change in bowel habits Skin: Denies abnormal skin rashes Lymphatics: Denies new lymphadenopathy or easy bruising Neurological: (+) Neuropathy at right fingertips  Behavioral/Psych:  (+) Anxiety  All other systems were reviewed with the patient and are negative.  MEDICAL HISTORY:  Past Medical History:  Diagnosis Date   Anemia    Anxiety    Breast cancer ([place] 2019   left breast   Ductal carcinoma in situ (DCIS) of left breast 02/03/2018   Family history of breast cancer    Family history of ovarian cancer    Genetic testing 02/21/2018   STAT Breast panel with reflex to Breast/GYN panel (23 genes) @ [facility] - No pathogenic mutations detected   Hypertension    PONV (postoperative nausea and vomiting)    Uterine fibroid     SURGICAL HISTORY: Past Surgical History:  Procedure Laterality Date   AXILLARY SURGERY     BREAST RECONSTRUCTION WITH PLACEMENT OF TISSUE EXPANDER AND FLEX HD (ACELLULAR HYDRATED DERMIS) Left 03/18/2018   Procedure: LEFT BREAST RECONSTRUCTION WITH PLACEMENT OF TISSUE EXPANDER AND FLEX HD (ACELLULAR HYDRATED DERMIS);  Surgeon: TIrene Limbo MD;  [place]  Service: Plastics;  Laterality: Left;   BREAST REDUCTION SURGERY Right 06/14/2020   Procedure: REVISION RIGHT BREAST REDUCTION;  Surgeon: TIrene Limbo MD;  [place]  Service: Clinical cytogeneticist;  Laterality: Right;   DENTAL SURGERY N/A 07/08/2018   KNEE ARTHROSCOPY Left 2013   LIPOSUCTION WITH LIPOFILLING Left 03/07/2019   Procedure: LIPOFILLING FROM ABDOMEN TO LEFT CHEST;  Surgeon: [name], MD;  Location: [place];  Service: Plastics;  Laterality: Left;   LIPOSUCTION WITH LIPOFILLING Bilateral 06/14/2020   Procedure: LIPOSUCTION  BILATERAL CHEST WALL AND ABDOMEN; LIPOFILLING LEFT CHEST;  Surgeon: [name], MD;  Location: [place];  Service: Plastics;  Laterality: Bilateral;   MASTECTOMY Left 2019/5   MASTECTOMY W/ SENTINEL NODE BIOPSY Left 03/18/2018   Procedure: TOTAL LEFT MASTECTOMY WITH AXILLARY SENTINEL LYMPH NODE BIOPSY;  Surgeon: Excell Seltzer, MD;  Location: [place];  Service: General;  Laterality: Left;   MASTOPEXY Right 03/07/2019   Procedure: RIGHT BREAST MASTOPEXY;  Surgeon: [name], MD;  Location: [place];  Service: Plastics;  Laterality: Right;   REDUCTION MAMMAPLASTY Right 02/2019   REMOVAL OF TISSUE EXPANDER AND PLACEMENT OF IMPLANT Left 03/07/2019   Procedure: REMOVAL OF LEFT TISSUE EXPANDER AND PLACEMENT OF SILICONE IMPLANT;  Surgeon: [name], MD;  Location: [place];  Service: Plastics;  Laterality: Left;   TOOTH EXTRACTION N/A 07/08/2018   Procedure: DENTAL RESTORATION/EXTRACTIONS;  Surgeon: Diona Browner, DDS;  Location: [place];  Service: Oral Surgery;  Laterality: N/A;   TUBAL LIGATION      I have reviewed the social history and family history with the patient and they are unchanged from previous note.  ALLERGIES:  has No Known Allergies.  MEDICATIONS:  Current Outpatient Medications  Medication Sig Dispense Refill   ALPRAZolam (XANAX) 0.5 MG tablet Take 1-2 tablets (0.5-1 mg total) by mouth at bedtime as needed for anxiety or sleep. 60 tablet 0   cyclobenzaprine (FLEXERIL) 10 MG tablet Take 1 tablet (10 mg total) by mouth 3 (three) times daily as needed for muscle spasms. 30 tablet 0   gabapentin (NEURONTIN) 300 MG capsule Take 1 capsule (300 mg total) by mouth 2 (two) times daily. 60 capsule 2   lisinopril (ZESTRIL) 10 MG tablet Take 1 tablet (10 mg total) by mouth daily. 90 tablet 0   lisinopril (ZESTRIL) 20 MG tablet Take 1 tablet (20 mg total) by mouth daily. 30 tablet 1    tamoxifen (NOLVADEX) 20 MG tablet TAKE 1 TABLET(20 MG) BY MOUTH DAILY 30 tablet 5   No current facility-administered medications for this visit.    PHYSICAL EXAMINATION: ECOG PERFORMANCE STATUS: 0 - Asymptomatic  Vitals:   08/14/20 1049  BP: (!) 172/106  Pulse: (!) 120  Resp: 18  Temp: 98.1 F (36.7 C)  SpO2: 99%   Filed Weights   08/14/20 1049  Weight: 198 lb 1.6 oz (89.9 kg)    GENERAL:alert, no distress and comfortable SKIN: skin color, texture, turgor are normal, no rashes or significant lesions EYES: normal, Conjunctiva are pink and non-injected, sclera clear  NECK: supple, thyroid normal size, non-tender, without nodularity LYMPH:  no palpable lymphadenopathy in the cervical, axillary  LUNGS: clear to auscultation and percussion with normal breathing effort HEART: regular rate & rhythm and no murmurs and no lower extremity edema ABDOMEN:abdomen soft, non-tender and normal bowel sounds Musculoskeletal:no cyanosis of digits and no clubbing  NEURO: alert & oriented x 3 with fluent speech, no focal motor/sensory deficits BREAST: S/p left mastectomy and b/l reconstruction surgery: Surgical incisions healed well.   LABORATORY DATA:  I have reviewed the data as listed CBC Latest Ref Rng &  Units 08/14/2020 02/12/2020 08/04/2019  WBC 4.0 - 10.5 K/uL 4.3 3.7(L) 3.5(L)  Hemoglobin 12.0 - 15.0 g/dL 13.8 14.1 13.8  Hematocrit 36 - 46 % 41.3 42.5 41.2  Platelets 150 - 400 K/uL 273 235 234     CMP Latest Ref Rng & Units 08/14/2020 02/12/2020 08/04/2019  Glucose 70 - 99 mg/dL 113(H) 150(H) 101(H)  BUN 6 - 20 mg/dL _0 Creatinine 0.44 - 1.00 mg/dL 0.84 0.95 0.86  Sodium 135 - 145 mmol/L 141 143 141  Potassium 3.5 - 5.1 mmol/L 3.9 3.5 4.3  Chloride 98 - 111 mmol/L 106 105 105  CO2 22 - 32 mmol/L _1 Calcium 8.9 - 10.3 mg/dL 9.5 9.3 8.9  Total Protein 6.5 - 8.1 g/dL 7.3 7.5 7.3  Total Bilirubin 0.3 - 1.2 mg/dL 0.4 0.6 0.4  Alkaline Phos 38 - 126 U/L 61 62 62  AST 15  - 41 U/L 15 13(L) 16  ALT 0 - 44 U/L _2 RADIOGRAPHIC STUDIES: I have personally reviewed the radiological images as listed and agreed with the findings in the report. No results found.   ASSESSMENT & PLAN:  Casey Baker is a 49 y.o. female with    1. 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 Treated with left total mastectomyand breast reconstructionand radiation. Currently onadjuvantTamoxifen. -I discussed her switching to AI for an additional 5 years when she becomes post-menopausal. She is not interested and would like to continue Tamoxifen.  -She is clinically doing well. Lab reviewed, her CBC and CMP are within normal limits. Her physical exam was unremarkable. There is no clinical concern for recurrence. -Continue surveillance.Next right breast Mammogram in 09/2020. -Continue Tamoxifen -F/u in 4 months -She declined flu shot, but did receive both COVID vaccines.    2.Hot Flashes,depression and anxiety, insomnia -These symptoms were noted before starting Tamoxifen. -She has vaginal dryness, no discharge.  -With work and on Tamoxifen her anxiety and depression has increased.  Nunzio Cory tried gabapentin, Melatonin, Ambien but without much relief. She tried to Effexor but stopped due to N&V. She tried Celexa 43m but stopped after 1 month due to drowsiness  -She is currently on Xanax 0.580m1-2 tabs. But feels this is not enough lately.  -I also dicussed speaking with someone for counseling. She is open to this. I also encouraged her to make lifestyle adjustment like increased exercise.    3. HTN,poorly controlled, Elevated heart rate -She is currently on Lisinopril -Her BP further increased to 172/106 today (08/14/20).  -I advised her to monitor her BP at home and set up  consult with new PCP. [facility]   4.Mild neutropenia, resolved  -She has developed mild leukopenia, in the past several months, previously white count was normal. -WBC and ANC normalized on labs today (08/14/20).   5. Weight gain  -The patient is concerned with her weight trending up -I discussed Tamoxifen can lead to weight gain.  -I discussed healthy weight and management,Ipreviouslyreferred her.  Plan -I refilled Xanax and increase it's dose for her anxiety  -I called in lisinopril today and increased dose to 2072maily  -she will find a PCP  -Continue Tamoxifen -Lab and f/u in4 months -Right breast Mammogram in 09/2020  -SW and chaplain consult for  her anxiety    No problem-specific Assessment & Plan notes found for this encounter.   Orders Placed This Encounter  Procedures   MM Digital Screening Unilat R    Standing Status:   Future    Standing Expiration Date:   08/14/2021    Order Specific Question:   Reason for Exam (SYMPTOM  OR DIAGNOSIS REQUIRED)    Answer:   screening    Order Specific Question:   Is the patient pregnant?    Answer:   No    Order Specific Question:   Preferred imaging location?    Answer:   [facility]   All questions were answered. The patient knows to call the clinic with any problems, questions or concerns. No barriers to learning was detected. The total time spent in the appointment was 30 minutes.     Truitt Merle, MD 08/14/2020   I, Joslyn Devon, am acting as scribe for Truitt Merle, MD.   I have reviewed the above documentation for accuracy and completeness, and I agree with the above.

## 2020-08-14 ENCOUNTER — Inpatient Hospital Stay: Payer: Medicaid Other

## 2020-08-14 ENCOUNTER — Telehealth: Payer: Self-pay

## 2020-08-14 ENCOUNTER — Other Ambulatory Visit: Payer: Self-pay

## 2020-08-14 ENCOUNTER — Inpatient Hospital Stay: Payer: Medicaid Other | Attending: Hematology | Admitting: Hematology

## 2020-08-14 ENCOUNTER — Encounter: Payer: Self-pay | Admitting: Hematology

## 2020-08-14 VITALS — BP 172/106 | HR 120 | Temp 98.1°F | Resp 18 | Ht 67.0 in | Wt 198.1 lb

## 2020-08-14 DIAGNOSIS — Z79899 Other long term (current) drug therapy: Secondary | ICD-10-CM | POA: Insufficient documentation

## 2020-08-14 DIAGNOSIS — Z170 Estrogen receptor positive status [ER+]: Secondary | ICD-10-CM

## 2020-08-14 DIAGNOSIS — G4700 Insomnia, unspecified: Secondary | ICD-10-CM | POA: Insufficient documentation

## 2020-08-14 DIAGNOSIS — C50412 Malignant neoplasm of upper-outer quadrant of left female breast: Secondary | ICD-10-CM

## 2020-08-14 DIAGNOSIS — D0512 Intraductal carcinoma in situ of left breast: Secondary | ICD-10-CM | POA: Insufficient documentation

## 2020-08-14 DIAGNOSIS — Z79810 Long term (current) use of selective estrogen receptor modulators (SERMs): Secondary | ICD-10-CM | POA: Insufficient documentation

## 2020-08-14 DIAGNOSIS — F32A Depression, unspecified: Secondary | ICD-10-CM | POA: Diagnosis not present

## 2020-08-14 DIAGNOSIS — G629 Polyneuropathy, unspecified: Secondary | ICD-10-CM | POA: Diagnosis not present

## 2020-08-14 DIAGNOSIS — F419 Anxiety disorder, unspecified: Secondary | ICD-10-CM | POA: Insufficient documentation

## 2020-08-14 DIAGNOSIS — I10 Essential (primary) hypertension: Secondary | ICD-10-CM | POA: Diagnosis not present

## 2020-08-14 LAB — CMP (CANCER CENTER ONLY)
ALT: 13 U/L (ref 0–44)
AST: 15 U/L (ref 15–41)
Albumin: 3.7 g/dL (ref 3.5–5.0)
Alkaline Phosphatase: 61 U/L (ref 38–126)
Anion gap: 9 (ref 5–15)
BUN: 7 mg/dL (ref 6–20)
CO2: 26 mmol/L (ref 22–32)
Calcium: 9.5 mg/dL (ref 8.9–10.3)
Chloride: 106 mmol/L (ref 98–111)
Creatinine: 0.84 mg/dL (ref 0.44–1.00)
GFR, Estimated: >60 mL/min (ref >60)
Glucose, Bld: 113 mg/dL — ABNORMAL HIGH (ref 70–99)
Potassium: 3.9 mmol/L (ref 3.5–5.1)
Sodium: 141 mmol/L (ref 135–145)
Total Bilirubin: 0.4 mg/dL (ref 0.3–1.2)
Total Protein: 7.3 g/dL (ref 6.5–8.1)

## 2020-08-14 LAB — CBC WITH DIFFERENTIAL (CANCER CENTER ONLY)
Abs Immature Granulocytes: 0.01 10*3/uL (ref 0.00–0.07)
Basophils Absolute: 0 10*3/uL (ref 0.0–0.1)
Basophils Relative: 1 %
Eosinophils Absolute: 0.1 10*3/uL (ref 0.0–0.5)
Eosinophils Relative: 2 %
HCT: 41.3 % (ref 36.0–46.0)
Hemoglobin: 13.8 g/dL (ref 12.0–15.0)
Immature Granulocytes: 0 %
Lymphocytes Relative: 40 %
Lymphs Abs: 1.7 10*3/uL (ref 0.7–4.0)
MCH: 28.9 pg (ref 26.0–34.0)
MCHC: 33.4 g/dL (ref 30.0–36.0)
MCV: 86.4 fL (ref 80.0–100.0)
Monocytes Absolute: 0.5 10*3/uL (ref 0.1–1.0)
Monocytes Relative: 12 %
Neutro Abs: 1.9 10*3/uL (ref 1.7–7.7)
Neutrophils Relative %: 45 %
Platelet Count: 273 10*3/uL (ref 150–400)
RBC: 4.78 MIL/uL (ref 3.87–5.11)
RDW: 13.9 % (ref 11.5–15.5)
WBC Count: 4.3 10*3/uL (ref 4.0–10.5)
nRBC: 0 % (ref 0.0–0.2)

## 2020-08-14 MED ORDER — ALPRAZOLAM 0.5 MG PO TABS: 0.5000 mg | ORAL_TABLET | Freq: Every evening | ORAL | 0 refills | Status: DC | PRN

## 2020-08-14 MED ORDER — LISINOPRIL 20 MG PO TABS: 20.0000 mg | ORAL_TABLET | Freq: Every day | ORAL | 1 refills | Status: DC

## 2020-08-14 NOTE — Telephone Encounter (Signed)
I spoke with Ms Lascola and gave her the number to [facility] [phone].

## 2020-08-15 ENCOUNTER — Telehealth: Payer: Self-pay | Admitting: Hematology

## 2020-08-15 NOTE — Telephone Encounter (Signed)
Scheduled per 10/27 los. Pt is aware of appt times and date.

## 2020-08-16 ENCOUNTER — Encounter: Payer: Self-pay | Admitting: General Practice

## 2020-08-16 NOTE — Progress Notes (Signed)
[place] CSW Progress Notes  Callt o patient per referral for anxiety/stress.  Patient diagnosed with Stage 1A DCIS in April 2019.  Has mastectomy and adjuvant radiation, currently on adjuvant Tamoxifen.  Has experienced significant anxiety since diagnosis and treatment - has not gotten any better in survivorship.  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 Used tp like to exercise, now finds it difficult to go out for a walk.  States that overwhelming feelings of anxiety come over her with no apparent trigger - she cannot state what she is anxious about, simply that she feels anxious.  In the past, she has been linked w counselor who worked on breathing exercises for anxiety management - she did not do well with the virtual environment and prefers in person work.  Discussed the nature of anxiety in the context of cancer diagnosis and treatment - reviewed anxiety as a biological feeling which is given a 'name.'  Patient cannot identify what she is worried about, only that she feels anxious.  Often feels distracted, loses train of thought, forgets where she is going while driving and similar.  Reports difficulty with sleeping - restless, difficulty falling and staying asleep.  Takes multiple sleep medications including prescribed and over the counter medications to help with sleep.  Has had difficulties with eating in the past - not currently.  Rates her current mood as 6 out of 10 depressed.  Denies any suicidal ideation, current or in the past.  Most pressing symptoms include lack of motivation, fatigue, feeling anxious, difficulty concentrating.  Discussed role of formal and informal support in developing ways to cope w understandable anxiety related to breast cancer diagnosis and treatment.  Referred to [facility] counseling intern for ongoing counseling.  Emailed information on [employer]  and [employer].  Requested Hotel manager.  Will call in two weeks to follow up.  Edwyna Shell, LCSW Clinical Social Worker Phone:  [phone]

## 2020-08-19 ENCOUNTER — Telehealth: Payer: Self-pay

## 2020-08-19 NOTE — Telephone Encounter (Signed)
Called to see if she is interested in counseling, set up first meeting for 11/5 at 9 am.  Gaylyn Rong Counseling Intern

## 2020-08-22 ENCOUNTER — Telehealth: Payer: Self-pay | Admitting: General Practice

## 2020-08-22 NOTE — Telephone Encounter (Signed)
[facility] CSW Progress Notes  Call from scheduling, patient has had death in family and cannot keep appt w CSW or counseling intern.  She will call us back in a week or two to get set up w future appointments.  Edwyna Shell, LCSW Clinical Social Worker Phone:  [phone]

## 2020-08-23 ENCOUNTER — Inpatient Hospital Stay: Payer: Medicaid Other | Admitting: General Practice

## 2020-09-04 IMAGING — MG DIGITAL SCREENING UNILAT RIGHT W/ TOMO W/ CAD
4 series · 4 of 12 positions shown · non-contrast
Comparison: Previous exam(s).

CLINICAL DATA: Screening. Post left mastectomy for breast cancer,
and right breast reduction mammoplasty.

EXAM:
DIGITAL SCREENING UNILATERAL RIGHT MAMMOGRAM WITH CAD AND TOMO

[R MLO synth-2D]
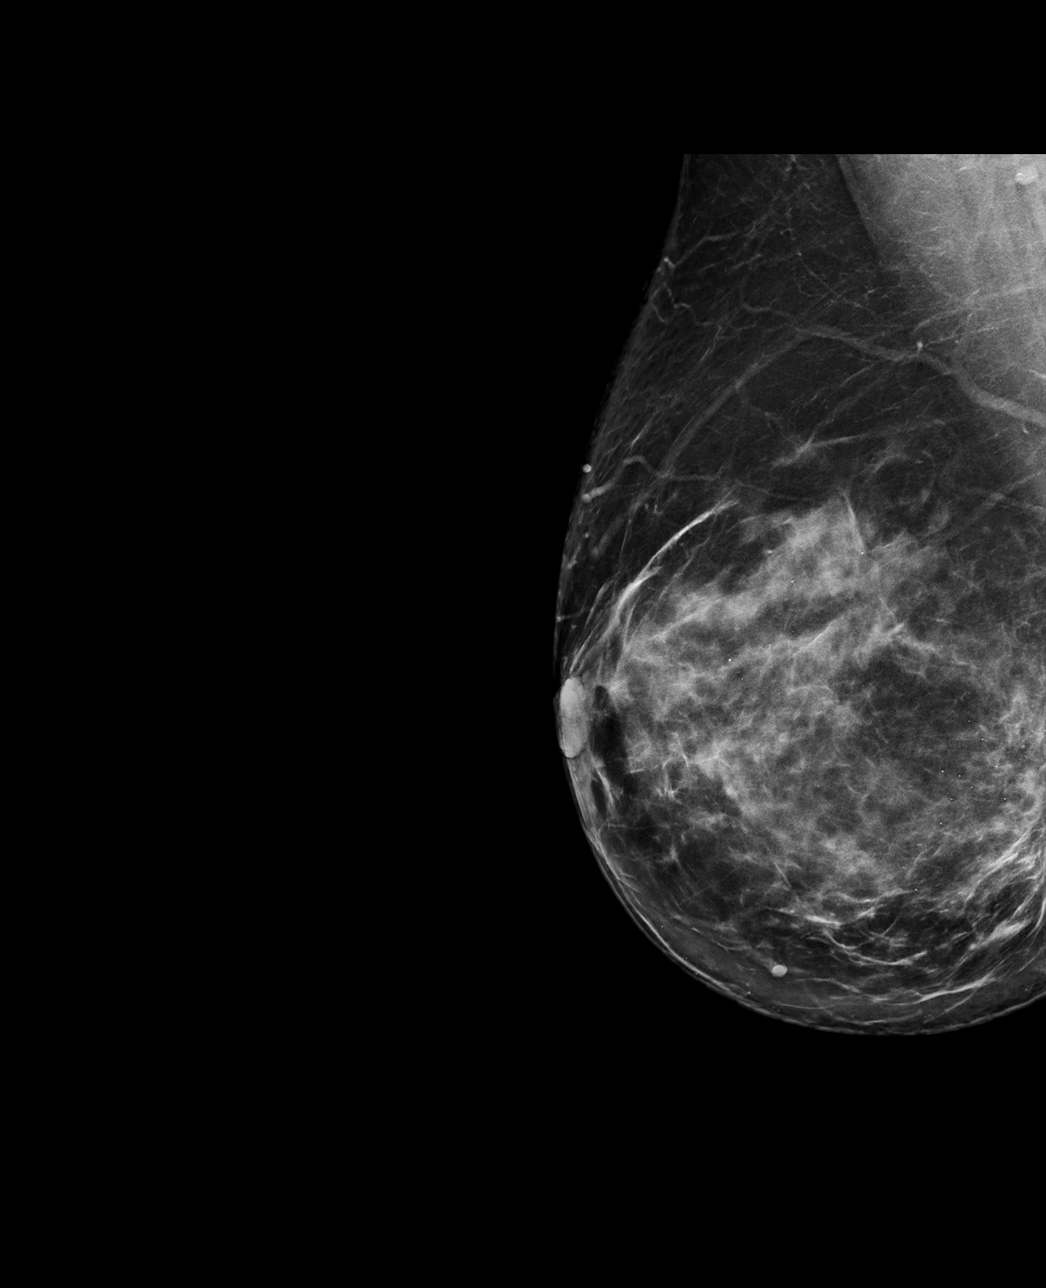

[R CC synth-2D]
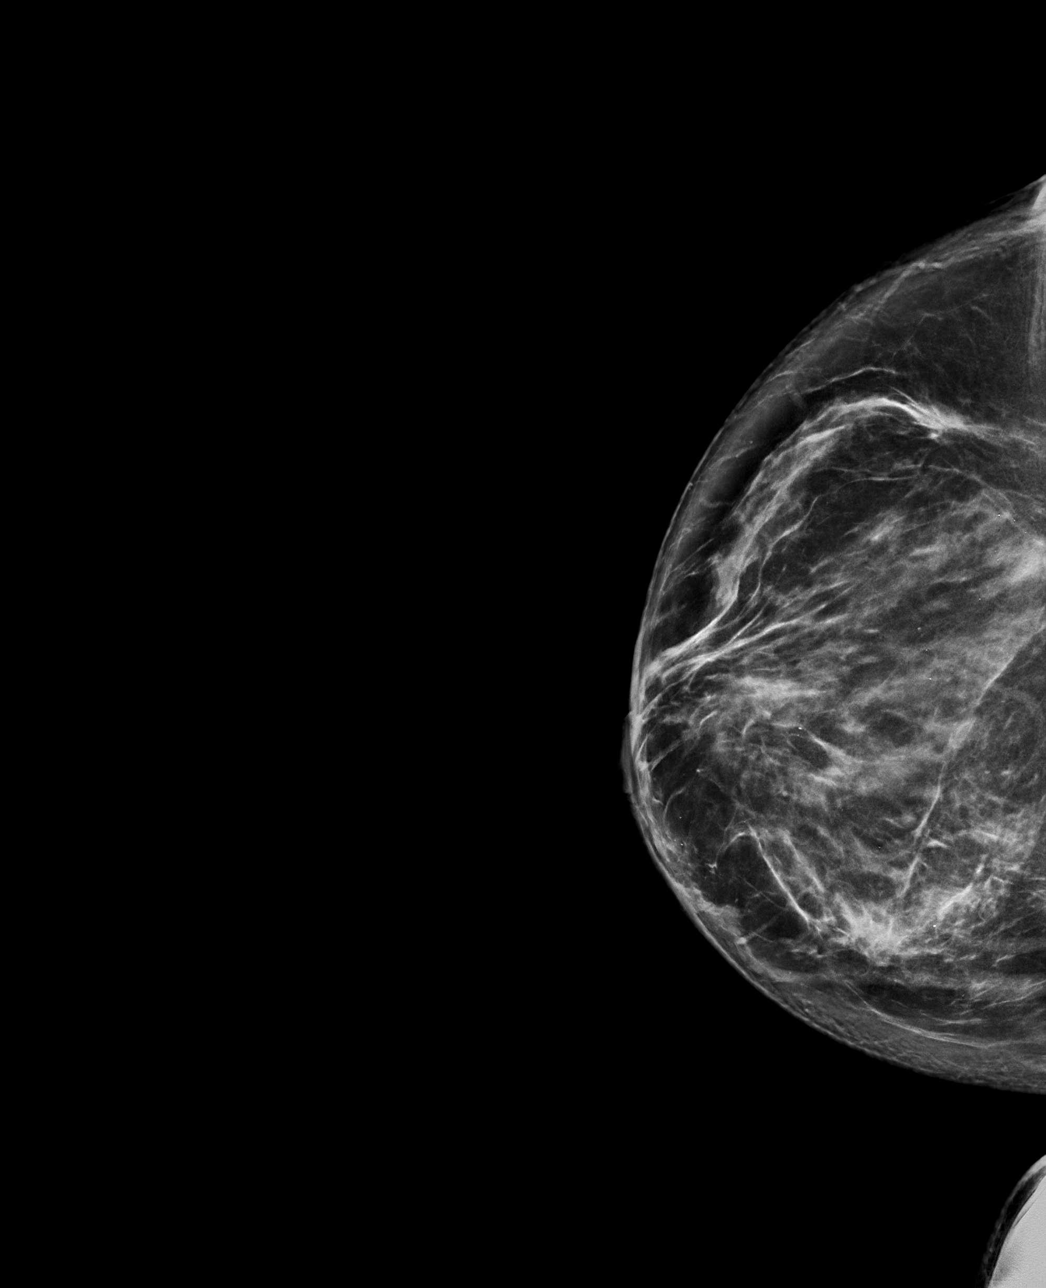

[R CC tomo · tomo slice 47/92.0]
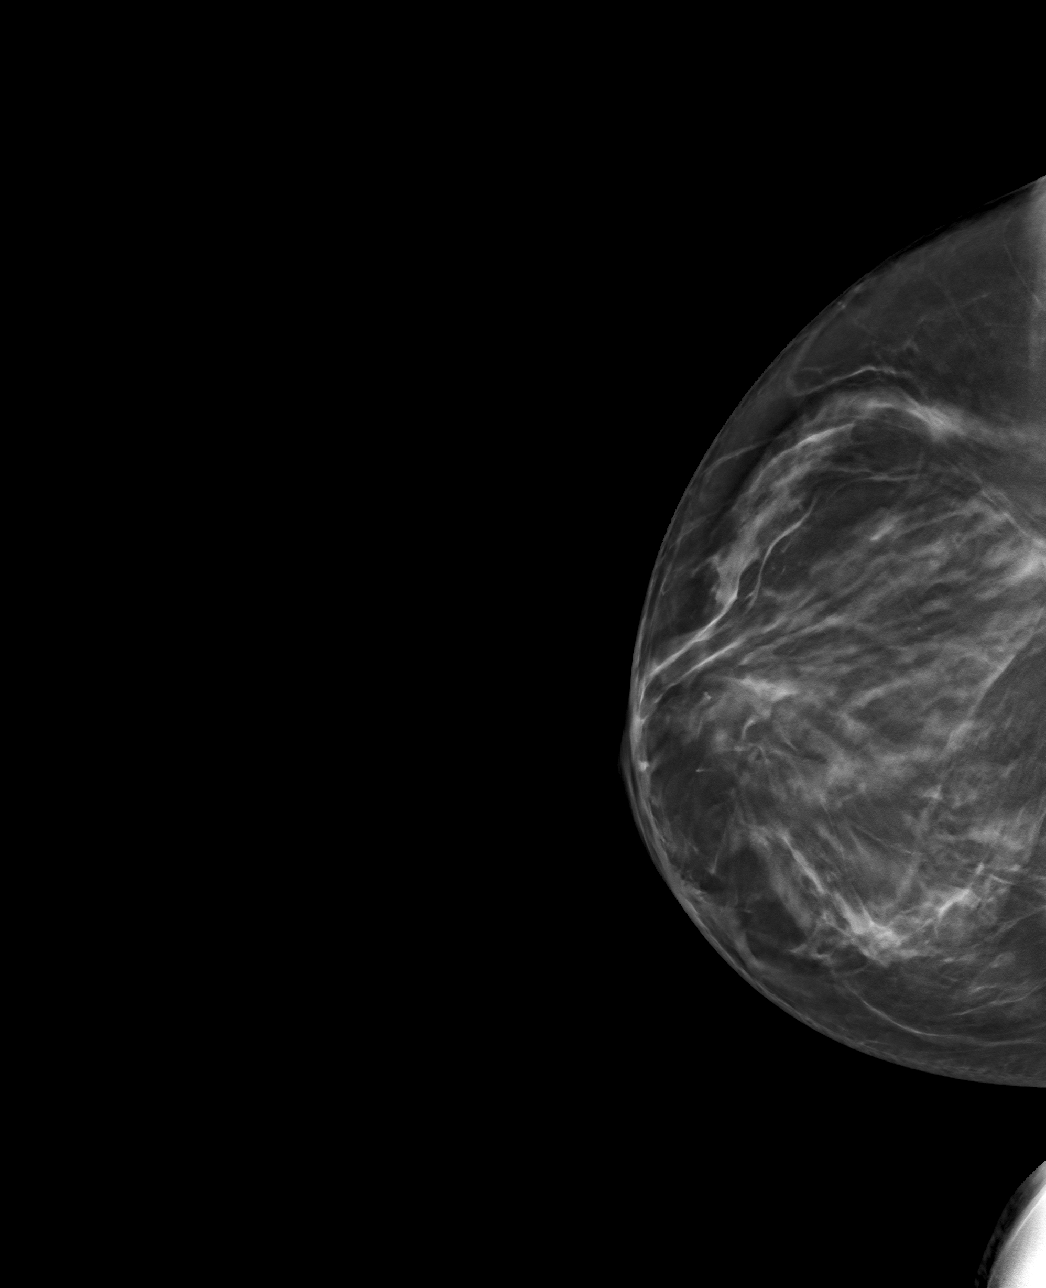

[R MLO tomo · tomo slice 47/94.0]
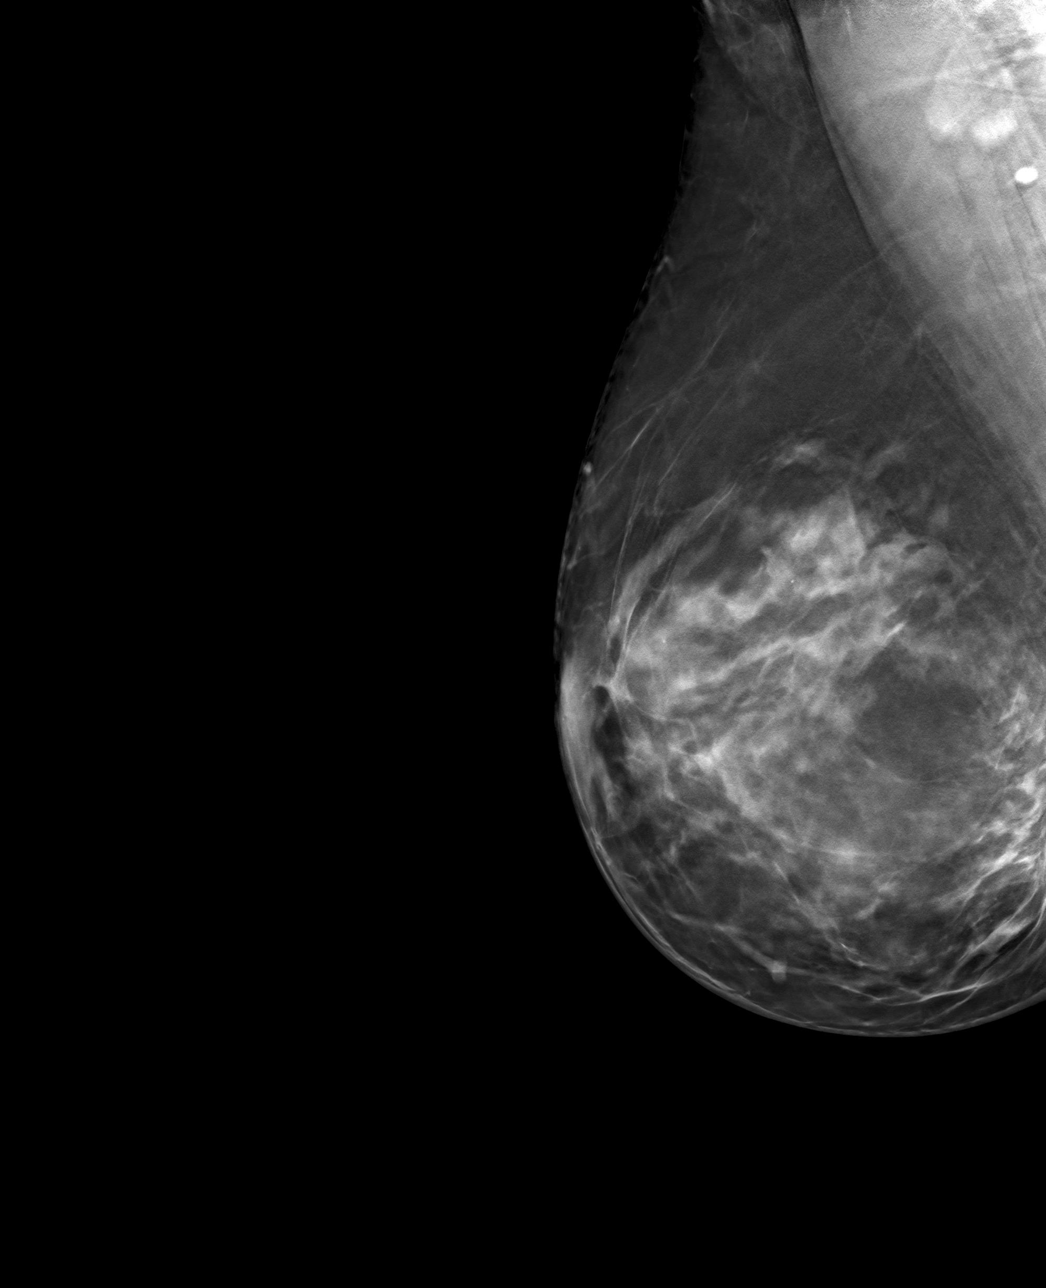

[4 of 12 positions shown; findings below may reference images not displayed]

ACR Breast Density Category c: The breast tissue is heterogeneously
dense, which may obscure small masses.
FINDINGS: The patient has had a left mastectomy. There are no findings
suspicious for malignancy. Images were processed with CAD.
IMPRESSION: No mammographic evidence of malignancy. A result letter of this
screening mammogram will be mailed directly to the patient.

RECOMMENDATION:
Screening mammogram in one year.  (Code:S9-8-RP9)

BI-RADS CATEGORY  1: Negative.

## 2020-09-15 ENCOUNTER — Encounter: Payer: Self-pay | Admitting: Hematology

## 2020-09-17 ENCOUNTER — Other Ambulatory Visit: Payer: Self-pay | Admitting: Hematology

## 2020-09-17 DIAGNOSIS — C50412 Malignant neoplasm of upper-outer quadrant of left female breast: Secondary | ICD-10-CM

## 2020-09-17 MED ORDER — ALPRAZOLAM 0.5 MG PO TABS: 0.5000 mg | ORAL_TABLET | Freq: Every evening | ORAL | 0 refills | Status: DC | PRN

## 2020-10-16 ENCOUNTER — Ambulatory Visit: Payer: Medicaid Other | Admitting: Physician Assistant

## 2020-10-31 ENCOUNTER — Ambulatory Visit: Payer: Medicaid Other | Admitting: Family Medicine

## 2020-11-03 ENCOUNTER — Other Ambulatory Visit: Payer: Self-pay | Admitting: Hematology

## 2020-11-03 DIAGNOSIS — Z170 Estrogen receptor positive status [ER+]: Secondary | ICD-10-CM

## 2020-11-03 DIAGNOSIS — C50412 Malignant neoplasm of upper-outer quadrant of left female breast: Secondary | ICD-10-CM

## 2020-11-04 MED ORDER — ALPRAZOLAM 0.5 MG PO TABS: 0.5000 mg | ORAL_TABLET | Freq: Every evening | ORAL | 0 refills | Status: DC | PRN

## 2020-11-04 NOTE — Telephone Encounter (Signed)
Please see refill request.

## 2020-11-09 ENCOUNTER — Other Ambulatory Visit: Payer: Self-pay | Admitting: Hematology

## 2020-11-12 ENCOUNTER — Ambulatory Visit (INDEPENDENT_AMBULATORY_CARE_PROVIDER_SITE_OTHER): Payer: Medicaid Other | Admitting: Physician Assistant

## 2020-11-12 ENCOUNTER — Other Ambulatory Visit: Payer: Self-pay

## 2020-11-12 ENCOUNTER — Encounter: Payer: Self-pay | Admitting: Physician Assistant

## 2020-11-12 VITALS — BP 166/80 | HR 145 | Temp 97.6°F | Ht 68.5 in | Wt 194.2 lb

## 2020-11-12 DIAGNOSIS — F418 Other specified anxiety disorders: Secondary | ICD-10-CM | POA: Diagnosis not present

## 2020-11-12 DIAGNOSIS — R000 Tachycardia, unspecified: Secondary | ICD-10-CM | POA: Diagnosis not present

## 2020-11-12 DIAGNOSIS — G4700 Insomnia, unspecified: Secondary | ICD-10-CM | POA: Diagnosis not present

## 2020-11-12 DIAGNOSIS — I10 Essential (primary) hypertension: Secondary | ICD-10-CM

## 2020-11-12 MED ORDER — PROPRANOLOL HCL 20 MG PO TABS: 20.0000 mg | ORAL_TABLET | Freq: Three times a day (TID) | ORAL | 0 refills | Status: DC

## 2020-11-12 MED ORDER — LISINOPRIL-HYDROCHLOROTHIAZIDE 20-25 MG PO TABS: 1.0000 | ORAL_TABLET | Freq: Every day | ORAL | 1 refills | Status: DC

## 2020-11-12 NOTE — Progress Notes (Signed)
Casey Baker is a 50 y.o. female is here to establish care.  I acted as a Education administrator for [employer], PA-C Anselmo Pickler, LPN   History of Present Illness:   Chief Complaint  Patient presents with  . Establish Care  . Hypertension  . Insomnia  . Anxiety  . Depression    HPI  Hypertension Pt's blood pressure is not controlled, running 160/90 at home. She is checking blood pressure daily. Currently taking Lisinopril 20 mg daily prescribed by oncologist, who is requesting PCP take over management. She has headaches sometimes. Pt denies dizziness, blurred vision, chest pain, SOB or lower leg edema. Denies excessive caffeine intake, stimulant usage, excessive alcohol intake or increase in salt consumption.  She has significant anxiety due to recent cancer diagnosis.   Tachycardia Has never been worked up per patient. She does not have any symptoms with this, denies: chest pain, SOB, feeling of palpitations. Denies excessive caffeine or illicit drug use. Has been a chronic issue for patient. Has never been on beta blockers per her report. Readings at home are in the 115's when home and calm.   Insomnia Pt c/o trouble sleeping x 2 years. She has tried Melatonin and OTC sleep aides that did not help. Pt has trouble falling asleep, falls asleep for about 3 hours and then is up for a while until she falls back to sleep for another hour. Trialed trazodone prior without any results. She is currently being prescribed Xanax 0.5 mg, which she takes two of at once to help her sleep. She admits that she has poor sleep hygiene. Spends all day in bed, watches TV and keeps TV on for sleep.    Anxiety / Depression Pt has hx of anxiety and depression, this has gotten worse since dx with cancer. Currently taking Xanax 0.5 mg x two tablets once daily.  Denies SI/HI.  Has tried other medications in the past: Trialed celexa 10 mg -- drowsiness Takes gabapentin 300 mg up to TID -- does not have  significant relief of insomnia with this, does help with hot flashes Prescribed effexor but never took due to fear of side effects    Health Maintenance Due  Topic Date Due  . Hepatitis C Screening  Never done  . COVID-19 Vaccine (1) Never done  . COLONOSCOPY (Pts 45-66yrs Insurance coverage will need to be confirmed)  Never done    Past Medical History:  Diagnosis Date  . Anemia    required iron transfusions while having periods  . Anxiety   . Breast cancer ([place]) 2019   left breast  . 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 Family history of breast cancer   . Family history of ovarian cancer   . Genetic testing 02/21/2018   STAT Breast panel with reflex to Breast/GYN panel (23 genes) @ [facility] - No pathogenic mutations detected  . Hypertension   . PONV (postoperative nausea and vomiting)   . Uterine fibroid   . Vaginal delivery 1992, 1994, 1997     Social History   Tobacco Use  . Smoking status: Never Smoker  . Smokeless tobacco: Never Used  Vaping Use  . Vaping Use: Never used  Substance Use Topics  . Alcohol use: No  . Drug use: No    Past Surgical History:  Procedure Laterality Date  . AXILLARY SURGERY    . BREAST RECONSTRUCTION WITH PLACEMENT OF TISSUE EXPANDER AND FLEX HD (ACELLULAR HYDRATED DERMIS) Left 03/18/2018  Procedure: LEFT BREAST RECONSTRUCTION WITH PLACEMENT OF TISSUE EXPANDER AND FLEX HD (ACELLULAR HYDRATED DERMIS);  Surgeon: [name], MD;  Location: [facility];  Service: Plastics;  Laterality: Left;  . BREAST REDUCTION SURGERY Right 06/14/2020   Procedure: REVISION RIGHT BREAST REDUCTION;  Surgeon: [name], MD;  Location: [facility];  Service: Plastics;  Laterality: Right;  . DENTAL SURGERY N/A 07/08/2018  . KNEE ARTHROSCOPY Left 2013  . LIPOSUCTION WITH LIPOFILLING Left 03/07/2019   Procedure: LIPOFILLING FROM ABDOMEN TO LEFT CHEST;  Surgeon: [name], MD;  Location: [facility];  Service: Plastics;  Laterality: Left;  . LIPOSUCTION WITH LIPOFILLING Bilateral 06/14/2020   Procedure: LIPOSUCTION BILATERAL CHEST WALL AND ABDOMEN; LIPOFILLING LEFT CHEST;  Surgeon: [name], MD;  Location: [facility];  Service: Plastics;  Laterality: Bilateral;  . MASTECTOMY Left 2019/5  . MASTECTOMY W/ SENTINEL NODE BIOPSY Left 03/18/2018   Procedure: TOTAL LEFT MASTECTOMY WITH AXILLARY SENTINEL LYMPH NODE BIOPSY;  Surgeon: Excell Seltzer, MD;  Location: [facility];  Service: General;  Laterality: Left;  Marland Kitchen MASTOPEXY Right 03/07/2019   Procedure: RIGHT BREAST MASTOPEXY;  Surgeon: [name], MD;  Location: [facility];  Service: Plastics;  Laterality: Right;  . REDUCTION MAMMAPLASTY Right 02/2019  . REMOVAL OF TISSUE EXPANDER AND PLACEMENT OF IMPLANT Left 03/07/2019   Procedure: REMOVAL OF LEFT TISSUE EXPANDER AND PLACEMENT OF SILICONE IMPLANT;  Surgeon: [name], MD;  Location: [facility];  Service: Plastics;  Laterality: Left;  . TOOTH EXTRACTION N/A 07/08/2018   Procedure: DENTAL RESTORATION/EXTRACTIONS;  Surgeon: Diona Browner, DDS;  Location: [facility];  Service: Oral Surgery;  Laterality: N/A;  . TUBAL LIGATION      Family History  Problem Relation Age of Onset  . Hypertension Mother   . Stroke Mother   . Ovarian cancer Maternal Grandmother        dx 22s; deceased 4s. She declined therapy of any kind including surgery and chemo  . Breast cancer Cousin        dx 75s; currently 28s; daughter of matenral uncle  . Leukemia Maternal Aunt        currently 4  . Stroke Sister     PMHx, SurgHx, SocialHx, FamHx, Medications, and Allergies were reviewed in the Visit Navigator and updated as appropriate.   Patient Active Problem List   Diagnosis Date Noted  . Breast cancer, left breast ([place]) 03/18/2018  . Genetic testing 02/21/2018  . Family history of breast cancer   . Family  history of ovarian cancer   . Iron deficiency anemia due to chronic blood loss 02/09/2018  . Malignant neoplasm of upper-outer quadrant of left female breast ([place]) 02/03/2018  . Breast wound, left, subsequent encounter 01/12/2018  . Hypertension   . Anxiety with depression   . Uterine fibroid 09/29/2014  . Menorrhagia 09/29/2014  . Anemia 09/29/2014  . Sinus tachycardia     Social History   Tobacco Use  . Smoking status: Never Smoker  . Smokeless tobacco: Never Used  Vaping Use  . Vaping Use: Never used  Substance Use Topics  . Alcohol use: No  . Drug use: No    Current Medications and Allergies:    Current Outpatient Medications:  .  ALPRAZolam (XANAX) 0.5 MG tablet, Take 1-2 tablets (0.5-1 mg total) by mouth at bedtime as needed for anxiety or sleep., Disp: 60 tablet, Rfl: 0 .  gabapentin (NEURONTIN) 300 MG capsule, Take 1  capsule (300 mg total) by mouth 2 (two) times daily., Disp: 60 capsule, Rfl: 2 .  ibuprofen (ADVIL) 800 MG tablet, Take 800 mg by mouth 3 (three) times daily., Disp: , Rfl:  .  lisinopril-hydrochlorothiazide (ZESTORETIC) 20-25 MG tablet, Take 1 tablet by mouth daily., Disp: 30 tablet, Rfl: 1 .  propranolol (INDERAL) 20 MG tablet, Take 1 tablet (20 mg total) by mouth 3 (three) times daily., Disp: 90 tablet, Rfl: 0 .  tamoxifen (NOLVADEX) 20 MG tablet, TAKE 1 TABLET(20 MG) BY MOUTH DAILY, Disp: 30 tablet, Rfl: 5  No Known Allergies  Review of Systems   ROS  Negative unless otherwise specified per HPI.  Vitals:   Vitals:   11/12/20 0920  BP: (!) 166/80  Pulse: (!) 145  Temp: 97.6 F (36.4 C)  TempSrc: Temporal  SpO2: 96%  Weight: 194 lb 4 oz (88.1 kg)  Height: 5' 8.5" (1.74 m)     Body mass index is 29.11 kg/m.  Pulse down to 115  Physical Exam:    Physical Exam Vitals and nursing note reviewed.  Constitutional:      General: She is not in acute distress.    Appearance: She is well-developed. She is not ill-appearing, toxic-appearing  or sickly-appearing.  Cardiovascular:     Rate and Rhythm: Regular rhythm. Tachycardia present.     Pulses: Normal pulses.     Heart sounds: Normal heart sounds, S1 normal and S2 normal.     Comments: No LE edema Pulmonary:     Effort: Pulmonary effort is normal.     Breath sounds: Normal breath sounds.  Skin:    General: Skin is warm, dry and intact.  Neurological:     Mental Status: She is alert.     GCS: GCS eye subscore is 4. GCS verbal subscore is 5. GCS motor subscore is 6.  Psychiatric:        Attention and Perception: Attention normal.        Mood and Affect: Mood is anxious. Affect is tearful.        Speech: Speech normal.        Behavior: Behavior normal. Behavior is cooperative.      Assessment and Plan:    Sybol was seen today for establish care, hypertension, insomnia, anxiety and depression.  Diagnoses and all orders for this visit:  Primary hypertension; Sinus tachycardia Uncontrolled. Stop lisinopril. Start lisinopril-hctz 20-25. Follow-up with me or cardiology in 1 month, whomever's appointment is first. Continue to monitor BP and work on stress reduction. I would like to update TSH to make sure this is not contributing to HTN and tachycardia but she doesn't want blood work today. She was encouraged to make a lab only appointment to get this done, but she states that she may wait until her 1 month follow-up. Offered echo vs referral to cardiology to evaluate long-standing sinus tachycardia however she states that she would like to see cardiology. Referral placed today. -     TSH; Future -     Hemoglobin A1c; Future -     T4, free; Future -     Ambulatory referral to Cardiology  Insomnia, unspecified type Uncontrolled. She is agreeable to trialing weaning off xanax and improving sleep hygiene. We can trial other medications as able. Recommend decreasing xanax to 1 tablet daily. Follow-up in 1 month. Provided handout on sleep hygiene.  Anxiety with  depression Uncontrolled. She is reluctant to start talk therapy. She is afraid to trial another SSRI, was  on a low dose of very mild SSRI and felt like this changed her personality too much. We are going to trial transitioning to prn propranolol 20 TID for anxiety. Follow-up in 1 month, may trial buspar if unsuccessful.  Other orders -     lisinopril-hydrochlorothiazide (ZESTORETIC) 20-25 MG tablet; Take 1 tablet by mouth daily. -     propranolol (INDERAL) 20 MG tablet; Take 1 tablet (20 mg total) by mouth 3 (three) times daily.  CMA or LPN served as scribe during this visit. History, Physical, and Plan performed by medical provider. The above documentation has been reviewed and is accurate and complete.  Time spent with patient today was 50 minutes which consisted of chart review, discussing diagnosis, work up, treatment answering questions and documentation.   [name], PA-C [place] 11/12/2020  Follow-up: No follow-ups on file.

## 2020-11-12 NOTE — Patient Instructions (Addendum)
It was great to see you!  Blood pressure medication Stop lisinopril Start combo pill lisinopril-hydrochlorothiazide Follow-up in 1 month We will do labs at that time  Cardiology referral will be placed. You will be contacted about this appointment.  For your xanax Decrease to one tablet daily  For your anxiety Start as needed propanolol (this is a beta blocker, which helps with anxiety and to decrease heart rate.) May take up to three times daily for your symptoms. Follow-up in 1 month   Sleep Hygiene  Do: (1) Go to bed at the same time each day. (2) Get up from bed at the same time each day. (3) Get regular exercise each day, preferably in the morning.  There is goof evidence that regular exercise improves restful sleep.  This includes stretching and aerobic exercise. (4) Get regular exposure to outdoor or bright lights, especially in the late afternoon. (5) Keep the temperature in your bedroom comfortable. (6) Keep the bedroom quiet when sleeping. (7) Keep the bedroom dark enough to facilitate sleep. (8) Use your bed only for sleep and sex. (9) Take medications as directed.  It is helpful to take prescribed sleeping pills 1 hour before bedtime, so they are causing drowsiness when you lie down, or 10 hours before getting up, to avoid daytime drowsiness. (10) Use a relaxation exercise just before going to sleep -- imagery, massage, warm bath. (11) Keep your feet and hands warm.  Wear warm socks and/or mittens or gloves to bed.  Don't: (1) Exercise just before going to bed. (2) Engage in stimulating activity just before bed, such as playing a competitive game, watching an exciting program on television, or having an important discussion with a loved one. (3) Have caffeine in the evening (coffee, teas, chocolate, sodas, etc.) (4) Read or watch television in bed. (5) Use alcohol to help you sleep. (6) Go to bed too hungry or too full. (7) Take another person's sleeping  pills. (8) Take over-the-counter sleeping pills, without your doctor's knowledge.  Tolerance can develop rapidly with these medications.  Diphenhydramine can have serious side effects for elderly patients. (9) Take daytime naps. (10) Command yourself to go to sleep.  This only makes your mind and body more alert.  If you lie awake for more than 20-30 minutes, get up, go to a different room, participate in a quiet activity (Ex - non-excitable reading or television), and then return to bed when you feel sleepy.  Do this as many times during the night as needed.  This may cause you to have a night or two of poor sleep but it will train your brain to know when it is time for sleep.    Take care,  [name] PA-C

## 2020-11-29 ENCOUNTER — Encounter: Payer: Self-pay | Admitting: General Practice

## 2020-12-03 ENCOUNTER — Other Ambulatory Visit: Payer: Self-pay | Admitting: Hematology

## 2020-12-03 DIAGNOSIS — Z170 Estrogen receptor positive status [ER+]: Secondary | ICD-10-CM

## 2020-12-04 ENCOUNTER — Other Ambulatory Visit: Payer: Self-pay | Admitting: Hematology

## 2020-12-04 DIAGNOSIS — Z170 Estrogen receptor positive status [ER+]: Secondary | ICD-10-CM

## 2020-12-04 DIAGNOSIS — C50412 Malignant neoplasm of upper-outer quadrant of left female breast: Secondary | ICD-10-CM

## 2020-12-04 MED ORDER — ALPRAZOLAM 0.5 MG PO TABS: 0.5000 mg | ORAL_TABLET | Freq: Every evening | ORAL | 0 refills | Status: DC | PRN

## 2020-12-13 NOTE — Progress Notes (Signed)
[place]   [phone] [phone]   Clinic Follow up Note   Patient Care Team: [name], [place] as PCP - General (Physician Assistant) Excell Seltzer, MD (Inactive) as Consulting Physician (General Surgery) Truitt Merle, MD as Consulting Physician (Hematology) Gery Pray, MD as Consulting Physician (Radiation Oncology) Alla Feeling, NP as Nurse Practitioner (Nurse Practitioner)  Date of Service:  12/16/2020  CHIEF COMPLAINT: F/u on left breastcancer  SUMMARY OF ONCOLOGIC HISTORY: Oncology History Overview Note  Cancer Staging Ductal carcinoma in situ (DCIS) of left breast Staging form: Breast, AJCC 8th Edition - Clinical stage from 02/09/2018: Stage 0 (cTis (DCIS), cN0, cM0, ER+, PR+) - Unsigned - Pathologic stage from 03/18/2018: Stage IA (pT16m, pN1a, cM0, G1, ER+, PR+, HER2-) - Signed by [name] on 03/28/2018     Malignant neoplasm of upper-outer quadrant of left female breast ([facility]  01/27/2018 Mammogram   IMPRESSION: Suspicious bloody left nipple discharge with palpable masses in the 2 o'clock axis of the left breast. Findings are suspicious for Malignancy. ADDENDUM: Magnification views of the retroareolar right breast were performed to evaluate calcifications seen on the patient's baseline mammogram. There are loosely grouped and scattered calcifications in the outer right breast, superior to and directly posterior to the nipple. These calcifications are rounded and smudgy in the CC projection, and many of them demonstrate layering on the 90 degree lateral magnification view, consistent with benign milk of calcium. No suspicious microcalcifications are seen in the right breast.   01/31/2018 Initial Biopsy   Diagnosis 01/31/18 1. 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 - NEGATIVE FOR INVASIVE CARCINOMA. 2. Breast,  left, needle core biopsy, 2 o'clock, 10 cfn - LOW GRADE DUCTAL CARCINOMA IN SITU (DCIS) PARTIALLY INVOLVING AN INTRADUCTAL PAPILLOMA. - NEGATIVE FOR INVASIVE CARCINOMA.   01/31/2018 Receptors her2   Prognostic indicators significant for: ER, 100% positive and PR, 100% positive, both with strong staining intensity.    02/03/2018 Initial Diagnosis   Ductal carcinoma in situ (DCIS) of left breast   02/10/2018 Genetic Testing   Testing did not reveal a pathogenic mutation in any of the genes analyzed.A copy of the genetic test report will be scanned into Epic under the Media tab. The genes analyzed were the 23 genes on [facility]'s Breast/GYN panel (ATM, BARD1, BRCA1, BRCA2, BRIP1, CDH1, CHEK2, DICER1, EPCAM, MLH1,  MSH2, MSH6, NBN, NF1, PALB2, PMS2, PTEN, RAD50, RAD51C, RAD51D,SMARCA4, STK11, and TP53).  Genetic testing involved analysis of 9 genes: ATM, BRCA1, BRCA2, CDH1, CHEK2, PALB2, PTEN, STK11 and TP53 genes. Testing was normal and did not reveal a mutation in these genes.     03/18/2018 Surgery   TOTAL LEFT MASTECTOMY WITH AXILLARY SENTINEL LYMPH NODE BIOPSY and  LEFT BREAST RECONSTRUCTION WITH PLACEMENT OF TISSUE EXPANDER AND FLEX HD (ACELLULAR HYDRATED DERMIS) by Dr. HExcell Seltzerand Dr. [name] 03/18/18   03/18/2018 Pathology Results   Diagnosis 03/18/18 1. Lymph node, sentinel, biopsy, Left Axillary #1 - METASTATIC CARCINOMA IN ONE OF ONE LYMPH NODES (1/1). 2. Lymph node, sentinel, biopsy, Left Axillary #2 - ONE OF ONE LYMPH NODES NEGATIVE FOR CARCINOMA (0/1). 3. Breast, simple mastectomy, Left Total - MICROINVASIVE DUCTAL CARCINOMA ARISING IN A BACKGROUND OF LOW GRADE DUCTAL CARCINOMA IN SITU. - DUCTAL CARCINOMA IN SITU PARTIALLY INVOLVES AN INTRADUCTAL PAPILLOMA. - RESECTION MARGINS ARE NEGATIVE FOR INVASIVE CARCINOMA. - IN SITU CARCINOMA IS PRESENT AT THE ANTERIOR MARGIN BROADLY. -  BIOPSY SITE. - SEE ONCOLOGY TABLE.    03/18/2018 Receptors her2   Estrogen Receptor: 100%,  POSITIVE, MODERATE-WEAK STAINING INTENSITY Progesterone Receptor: 100%, POSITIVE, STRONG STAINING INTENSITY Proliferation Marker Ki67: 1% HER2 - NEGATIVE   03/18/2018 Cancer Staging   Staging form: Breast, AJCC 8th Edition - Pathologic stage from 03/18/2018: Stage IA (pT21m, pN1a, cM0, G1, ER+, PR+, HER2-) - Signed by [name] on 03/28/2018   05/18/2018 - 06/27/2018 Radiation Therapy   Adjuvant radiation by Dr. KSondra Come   06/27/2018 Mammogram   06/27/2018 Mammogram IMPRESSION: Benign appearing right breast calcifications.  Recommendation: Screening right mammogram is suggested in April 2020.   07/19/2018 -  Anti-estrogen oral therapy   Adjuvant tamoxifen     Survivorship   Per LCira Rue NP    03/07/2019 Surgery   REMOVAL OF LEFT TISSUE EXPANDER AND PLACEMENT OF SILICONE IMPLANT and LIPOFILLING FROM ABDOMEN TO LEFT CHEST and RIGHT BREAST MASTOPEXY by Dr. [name] 03/07/19   06/14/2020 Surgery   REVISION RIGHT BREAST REDUCTION and LIPOSUCTION BILATERAL CHEST WALL AND ABDOMEN; LIPOFILLING LEFT CHEST by Dr [name]      CURRENT THERAPY:  AdjuvantTamoxifen, started in early Oct 2019  INTERVAL HISTORY:  TARDINE IACOVELLIis here for a follow up of left breast cancer. She presents to the clinic alone. She notes her depression is doing better. She notes her stress and HTN is doing better. She is on Xanax 2 tabs once nightly. She notes on Tamoxifen her mood did initially  worsen and she notes increased hair growth. She notes hot flashes are manageable. She denies vaginal discharge or bleeding.   REVIEW OF SYSTEMS:   Constitutional: Denies fevers, chills or abnormal weight loss (+) Hot flashes  Eyes: Denies blurriness of vision Ears, nose, mouth, throat, and face: Denies mucositis or sore throat Respiratory: Denies cough, dyspnea or wheezes Cardiovascular: Denies palpitation, chest discomfort or lower extremity swelling Gastrointestinal:  Denies nausea, heartburn or change in  bowel habits Skin: Denies abnormal skin rashes Lymphatics: Denies new lymphadenopathy or easy bruising Neurological:Denies numbness, tingling or new weaknesses Behavioral/Psych: Mood is stable, no new changes (+) Improved anxiety/depression All other systems were reviewed with the patient and are negative.  MEDICAL HISTORY:  Past Medical History:  Diagnosis Date  . Anemia    required iron transfusions while having periods  . Anxiety   . Breast cancer ([place]   left breast  . 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 Family history of breast cancer   . Family history of ovarian cancer   . Genetic testing 02/21/2018   STAT Breast panel with reflex to Breast/GYN panel (23 genes) @ [facility] - No pathogenic mutations detected  . Hypertension   . PONV (postoperative nausea and vomiting)   . Uterine fibroid   . Vaginal delivery 1992, 1994, 1997    SURGICAL HISTORY: Past Surgical History:  Procedure Laterality Date  . AXILLARY SURGERY    . BREAST RECONSTRUCTION WITH PLACEMENT OF TISSUE EXPANDER AND FLEX HD (ACELLULAR HYDRATED DERMIS) Left 03/18/2018   Procedure: LEFT BREAST RECONSTRUCTION WITH PLACEMENT OF TISSUE EXPANDER AND FLEX HD (ACELLULAR HYDRATED DERMIS);  Surgeon: TIrene Limbo MD;  [place] [facility]  Service: Plastics;  Laterality: Left;  . BREAST REDUCTION SURGERY Right 06/14/2020   Procedure: REVISION RIGHT BREAST REDUCTION;  Surgeon: TIrene Limbo MD;  [place] [facility]  Service: Plastics;  Laterality: Right;  . DENTAL SURGERY N/A 07/08/2018  . KNEE ARTHROSCOPY  Left 2013  . LIPOSUCTION WITH LIPOFILLING Left 03/07/2019   Procedure: LIPOFILLING FROM ABDOMEN TO LEFT CHEST;  Surgeon: [name], MD;  Location: [place];  Service: Plastics;  Laterality: Left;  . LIPOSUCTION WITH LIPOFILLING Bilateral 06/14/2020   Procedure: LIPOSUCTION BILATERAL CHEST WALL AND ABDOMEN; LIPOFILLING LEFT CHEST;   Surgeon: [name], MD;  Location: [facility];  Service: Plastics;  Laterality: Bilateral;  . MASTECTOMY Left 2019/5  . MASTECTOMY W/ SENTINEL NODE BIOPSY Left 03/18/2018   Procedure: TOTAL LEFT MASTECTOMY WITH AXILLARY SENTINEL LYMPH NODE BIOPSY;  Surgeon: Excell Seltzer, MD;  Location: [place];  Service: General;  Laterality: Left;  Marland Kitchen MASTOPEXY Right 03/07/2019   Procedure: RIGHT BREAST MASTOPEXY;  Surgeon: [name], MD;  Location: [place];  Service: Plastics;  Laterality: Right;  . REDUCTION MAMMAPLASTY Right 02/2019  . REMOVAL OF TISSUE EXPANDER AND PLACEMENT OF IMPLANT Left 03/07/2019   Procedure: REMOVAL OF LEFT TISSUE EXPANDER AND PLACEMENT OF SILICONE IMPLANT;  Surgeon: [name], MD;  Location: [facility];  Service: Plastics;  Laterality: Left;  . TOOTH EXTRACTION N/A 07/08/2018   Procedure: DENTAL RESTORATION/EXTRACTIONS;  Surgeon: Diona Browner, DDS;  Location: [place];  Service: Oral Surgery;  Laterality: N/A;  . TUBAL LIGATION      I have reviewed the social history and family history with the patient and they are unchanged from previous note.  ALLERGIES:  has No Known Allergies.  MEDICATIONS:  Current Outpatient Medications  Medication Sig Dispense Refill  . ALPRAZolam (XANAX) 0.5 MG tablet Take 1-2 tablets (0.5-1 mg total) by mouth at bedtime as needed for anxiety or sleep. 60 tablet 0  . gabapentin (NEURONTIN) 300 MG capsule Take 1 capsule (300 mg total) by mouth 2 (two) times daily. 60 capsule 2  . ibuprofen (ADVIL) 800 MG tablet Take 800 mg by mouth 3 (three) times daily.    Marland Kitchen lisinopril-hydrochlorothiazide (ZESTORETIC) 20-25 MG tablet Take 1 tablet by mouth daily. 30 tablet 1  . propranolol (INDERAL) 20 MG tablet Take 1 tablet (20 mg total) by mouth 3 (three) times daily. 90 tablet 0  . tamoxifen (NOLVADEX) 20 MG tablet TAKE 1 TABLET(20 MG) BY MOUTH DAILY 30 tablet 5   No current  facility-administered medications for this visit.    PHYSICAL EXAMINATION: ECOG PERFORMANCE STATUS: 0 - Asymptomatic  Vitals:   12/16/20 1100  BP: 121/72  Pulse: 88  Resp: 14  Temp: 97.9 F (36.6 C)  SpO2: 100%   Filed Weights   12/16/20 1100  Weight: 190 lb 4.8 oz (86.3 kg)    GENERAL:alert, no distress and comfortable SKIN: skin color, texture, turgor are normal, no rashes or significant lesions EYES: normal, Conjunctiva are pink and non-injected, sclera clear  NECK: supple, thyroid normal size, non-tender, without nodularity LYMPH:  no palpable lymphadenopathy in the cervical, axillary  LUNGS: clear to auscultation and percussion with normal breathing effort HEART: regular rate & rhythm and no murmurs and no lower extremity edema ABDOMEN:abdomen soft, non-tender and normal bowel sounds Musculoskeletal:no cyanosis of digits and no clubbing  NEURO: alert & oriented x 3 with fluent speech, no focal motor/sensory deficits BREAST: S/p left mastectomy and reconstruction: Surgical incision healed well with small nodularity along her left breast incision. No palpable mass, nodules or adenopathy bilaterally. Breast exam benign.    LABORATORY DATA:  I have reviewed the data as listed CBC Latest Ref Rng & Units 12/16/2020 08/14/2020 02/12/2020  WBC 4.0 - 10.5  K/uL 4.4 4.3 3.7(L)  Hemoglobin 12.0 - 15.0 g/dL 13.5 13.8 14.1  Hematocrit 36.0 - 46.0 % 40.1 41.3 42.5  Platelets 150 - 400 K/uL 282 273 235     CMP Latest Ref Rng & Units 12/16/2020 08/14/2020 02/12/2020  Glucose 70 - 99 mg/dL 108(H) 113(H) 150(H)  BUN 6 - 20 mg/dL '11 7 9  ' Creatinine 0.44 - 1.00 mg/dL 0.99 0.84 0.95  Sodium 135 - 145 mmol/L 136 141 143  Potassium 3.5 - 5.1 mmol/L 4.0 3.9 3.5  Chloride 98 - 111 mmol/L 102 106 105  CO2 22 - 32 mmol/L '26 26 26  ' Calcium 8.9 - 10.3 mg/dL 9.4 9.5 9.3  Total Protein 6.5 - 8.1 g/dL 7.7 7.3 7.5  Total Bilirubin 0.3 - 1.2 mg/dL 0.6 0.4 0.6  Alkaline Phos 38 - 126 U/L 63 61 62   AST 15 - 41 U/L 14(L) 15 13(L)  ALT 0 - 44 U/L '11 13 10      ' RADIOGRAPHIC STUDIES: I have personally reviewed the radiological images as listed and agreed with the findings in the report. No results found.   ASSESSMENT & PLAN:  ANANI GU is a 50 y.o. female with   1. 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 Treated with left total mastectomyand breast reconstructionand radiation. Currently onadjuvantTamoxifen. -I discussed her switching to AI for an additional 5 years when she becomes post-menopausal. Sheis not interested andwould like to continue Tamoxifen.  -She is clinically doing well. Lab reviewed, her CBC and CMP are within normal limits, except BG 108. Her physical exam and her 08/2019 mammogram were unremarkable. There is no clinical concern for recurrence. -Continue surveillance.Next right breast Mammogram in 12/19/20 -Continue Tamoxifen -f/u in 6 months   2.Hot Flashes,depression and anxiety, insomnia -These symptoms were noted before starting Tamoxifen. -With work, cancer diagnosis and Tamoxifen her anxiety and depression has increased.  Nunzio Cory tried gabapentin, Melatonin, Ambien but without much relief. She tried to Effexor but stopped due to N&V. She tried Celexa 67m but stopped after 1 month due to drowsiness  -She is currently on Xanax 0.576m2 tabs nightly. Anxiety and depression improved. She continues to f/u with counselor.   3. HTN,poorly controlled, Elevated heart rate -She is currently on Lisinopril -BP improved with her improved depression, anxiety and stress.   5. Weight gain  -The patient is concerned with her weight trending up -I discussed Tamoxifen can lead to weight gain.   -Her weight has been trending down. I encouraged her to continue    Plan -Continue Tamoxifen -Lab and f/u in 6 months  -Right breast Mammogram on 12/19/20  -Per request she is fine to hold Tamoxifen for 1 week next month when she is on vacation.  -OK to refill her Xanax    No problem-specific Assessment & Plan notes found for this encounter.   No orders of the defined types were placed in this encounter.  All questions were answered. The patient knows to call the clinic with any problems, questions or concerns. No barriers to learning was detected. The total time spent in the appointment was 30 minutes.     YaTruitt MerleMD 12/16/2020   I, AmJoslyn Devonam acting as scribe for YaTruitt MerleMD.   I have reviewed the above documentation for accuracy and completeness, and I agree with the above.

## 2020-12-16 ENCOUNTER — Inpatient Hospital Stay: Payer: Medicaid Other | Attending: Hematology | Admitting: Hematology

## 2020-12-16 ENCOUNTER — Inpatient Hospital Stay: Payer: Medicaid Other

## 2020-12-16 ENCOUNTER — Encounter: Payer: Self-pay | Admitting: Hematology

## 2020-12-16 ENCOUNTER — Other Ambulatory Visit: Payer: Self-pay

## 2020-12-16 VITALS — BP 121/72 | HR 88 | Temp 97.9°F | Resp 14 | Ht 68.5 in | Wt 190.3 lb

## 2020-12-16 DIAGNOSIS — Z170 Estrogen receptor positive status [ER+]: Secondary | ICD-10-CM | POA: Diagnosis not present

## 2020-12-16 DIAGNOSIS — F32A Depression, unspecified: Secondary | ICD-10-CM | POA: Insufficient documentation

## 2020-12-16 DIAGNOSIS — Z1501 Genetic susceptibility to malignant neoplasm of breast: Secondary | ICD-10-CM | POA: Insufficient documentation

## 2020-12-16 DIAGNOSIS — C50412 Malignant neoplasm of upper-outer quadrant of left female breast: Secondary | ICD-10-CM | POA: Insufficient documentation

## 2020-12-16 DIAGNOSIS — R232 Flushing: Secondary | ICD-10-CM | POA: Insufficient documentation

## 2020-12-16 DIAGNOSIS — Z79899 Other long term (current) drug therapy: Secondary | ICD-10-CM | POA: Diagnosis not present

## 2020-12-16 DIAGNOSIS — G4700 Insomnia, unspecified: Secondary | ICD-10-CM | POA: Diagnosis not present

## 2020-12-16 DIAGNOSIS — Z8041 Family history of malignant neoplasm of ovary: Secondary | ICD-10-CM | POA: Diagnosis not present

## 2020-12-16 DIAGNOSIS — Z803 Family history of malignant neoplasm of breast: Secondary | ICD-10-CM | POA: Diagnosis not present

## 2020-12-16 DIAGNOSIS — I10 Essential (primary) hypertension: Secondary | ICD-10-CM | POA: Diagnosis not present

## 2020-12-16 DIAGNOSIS — Z79810 Long term (current) use of selective estrogen receptor modulators (SERMs): Secondary | ICD-10-CM | POA: Diagnosis not present

## 2020-12-16 LAB — CBC WITH DIFFERENTIAL (CANCER CENTER ONLY)
Abs Immature Granulocytes: 0 10*3/uL (ref 0.00–0.07)
Basophils Absolute: 0 10*3/uL (ref 0.0–0.1)
Basophils Relative: 1 %
Eosinophils Absolute: 0 10*3/uL (ref 0.0–0.5)
Eosinophils Relative: 1 %
HCT: 40.1 % (ref 36.0–46.0)
Hemoglobin: 13.5 g/dL (ref 12.0–15.0)
Immature Granulocytes: 0 %
Lymphocytes Relative: 39 %
Lymphs Abs: 1.7 10*3/uL (ref 0.7–4.0)
MCH: 29 pg (ref 26.0–34.0)
MCHC: 33.7 g/dL (ref 30.0–36.0)
MCV: 86.2 fL (ref 80.0–100.0)
Monocytes Absolute: 0.5 10*3/uL (ref 0.1–1.0)
Monocytes Relative: 12 %
Neutro Abs: 2.1 10*3/uL (ref 1.7–7.7)
Neutrophils Relative %: 47 %
Platelet Count: 282 10*3/uL (ref 150–400)
RBC: 4.65 MIL/uL (ref 3.87–5.11)
RDW: 13.5 % (ref 11.5–15.5)
WBC Count: 4.4 10*3/uL (ref 4.0–10.5)
nRBC: 0 % (ref 0.0–0.2)

## 2020-12-16 LAB — CMP (CANCER CENTER ONLY)
ALT: 11 U/L (ref 0–44)
AST: 14 U/L — ABNORMAL LOW (ref 15–41)
Albumin: 3.9 g/dL (ref 3.5–5.0)
Alkaline Phosphatase: 63 U/L (ref 38–126)
Anion gap: 8 (ref 5–15)
BUN: 11 mg/dL (ref 6–20)
CO2: 26 mmol/L (ref 22–32)
Calcium: 9.4 mg/dL (ref 8.9–10.3)
Chloride: 102 mmol/L (ref 98–111)
Creatinine: 0.99 mg/dL (ref 0.44–1.00)
GFR, Estimated: >60 mL/min (ref >60)
Glucose, Bld: 108 mg/dL — ABNORMAL HIGH (ref 70–99)
Potassium: 4 mmol/L (ref 3.5–5.1)
Sodium: 136 mmol/L (ref 135–145)
Total Bilirubin: 0.6 mg/dL (ref 0.3–1.2)
Total Protein: 7.7 g/dL (ref 6.5–8.1)

## 2020-12-17 ENCOUNTER — Encounter: Payer: Self-pay | Admitting: Physician Assistant

## 2020-12-17 ENCOUNTER — Ambulatory Visit (INDEPENDENT_AMBULATORY_CARE_PROVIDER_SITE_OTHER): Payer: Medicaid Other | Admitting: Physician Assistant

## 2020-12-17 ENCOUNTER — Telehealth: Payer: Self-pay | Admitting: Hematology

## 2020-12-17 VITALS — BP 110/70 | HR 98 | Temp 98.2°F | Ht 68.5 in | Wt 191.0 lb

## 2020-12-17 DIAGNOSIS — R5383 Other fatigue: Secondary | ICD-10-CM | POA: Diagnosis not present

## 2020-12-17 DIAGNOSIS — F418 Other specified anxiety disorders: Secondary | ICD-10-CM

## 2020-12-17 DIAGNOSIS — I10 Essential (primary) hypertension: Secondary | ICD-10-CM

## 2020-12-17 DIAGNOSIS — G4700 Insomnia, unspecified: Secondary | ICD-10-CM

## 2020-12-17 DIAGNOSIS — E8881 Metabolic syndrome: Secondary | ICD-10-CM | POA: Insufficient documentation

## 2020-12-17 LAB — HEMOGLOBIN A1C: Hgb A1c MFr Bld: 6.5 % (ref 4.6–6.5)

## 2020-12-17 LAB — TSH: TSH: 1.04 u[IU]/mL (ref 0.35–4.50)

## 2020-12-17 LAB — T4, FREE: Free T4: 1.03 ng/dL (ref 0.60–1.60)

## 2020-12-17 MED ORDER — PROPRANOLOL HCL 20 MG PO TABS: 20.0000 mg | ORAL_TABLET | Freq: Three times a day (TID) | ORAL | 3 refills | Status: DC

## 2020-12-17 MED ORDER — LISINOPRIL-HYDROCHLOROTHIAZIDE 20-25 MG PO TABS: 1.0000 | ORAL_TABLET | Freq: Every day | ORAL | 2 refills | Status: DC

## 2020-12-17 NOTE — Progress Notes (Signed)
Casey Casey Baker is a 50 y.o. female is here for follow up.  I acted as a Education administrator for [employer], PA-C Anselmo Pickler, LPN   History of Present Illness:   Chief Complaint  Patient presents with  . Hypertension  . Insomnia  . Anxiety  . Depression    HPI   Hypertension Currently taking Lisinopril -HCTZ 20-25 mg daily started a month ago. Pt is checking blood pressure at home daily. Averaging 121/82. Pt denies headaches, dizziness, blurred vision, chest pain, SOB or lower leg edema. Denies excessive caffeine intake, stimulant usage, excessive alcohol intake or increase in salt consumption.  Insomnia Currently taking Xanax 0.5 mg two tablets at bedtime and also using Melatonin 4 mg. Pt says sleep is getting better. She does feel like maybe she could start to slightly reduce her xanax use.  Anxiety/Depression Pt was started on propanolol 20 mg TID last visit. Pt taking 1-2 times a day. Pt says she is doing better. Denies SI/HI. Takes it in the morning and in the afternoon. Feels like she is overall in a better place mentally with her symptoms.      Health Maintenance Due  Topic Date Due  . Hepatitis C Screening  Never done  . COVID-19 Vaccine (1) Never done  . COLONOSCOPY (Pts 45-46yrs Insurance coverage will need to be confirmed)  Never done    Past Medical History:  Diagnosis Date  . Anemia    required iron transfusions while having periods  . Anxiety   . Breast cancer ([name]) 2019   left breast  . 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 Family history of breast cancer   . Family history of ovarian cancer   . Genetic testing 02/21/2018   STAT Breast panel with reflex to Breast/GYN panel (23 genes) @ [facility] - No pathogenic mutations detected  . Hypertension   . PONV (postoperative nausea and vomiting)   . Uterine fibroid   . Vaginal delivery 1992, 1994, 1997     Social History   Tobacco Use  . Smoking status: Never Smoker  . Smokeless  tobacco: Never Used  Vaping Use  . Vaping Use: Never used  Substance Use Topics  . Alcohol use: No  . Drug use: No    Past Surgical History:  Procedure Laterality Date  . AXILLARY SURGERY    . BREAST RECONSTRUCTION WITH PLACEMENT OF TISSUE EXPANDER AND FLEX HD (ACELLULAR HYDRATED DERMIS) Left 03/18/2018   Procedure: LEFT BREAST RECONSTRUCTION WITH PLACEMENT OF TISSUE EXPANDER AND FLEX HD (ACELLULAR HYDRATED DERMIS);  Surgeon: [name], MD;  Location: [place];  Service: Plastics;  Laterality: Left;  . BREAST REDUCTION SURGERY Right 06/14/2020   Procedure: REVISION RIGHT BREAST REDUCTION;  Surgeon: [name], MD;  Location: [facility];  Service: Plastics;  Laterality: Right;  . DENTAL SURGERY N/A 07/08/2018  . KNEE ARTHROSCOPY Left 2013  . LIPOSUCTION WITH LIPOFILLING Left 03/07/2019   Procedure: LIPOFILLING FROM ABDOMEN TO LEFT CHEST;  Surgeon: [name], MD;  Location: [facility];  Service: Plastics;  Laterality: Left;  . LIPOSUCTION WITH LIPOFILLING Bilateral 06/14/2020   Procedure: LIPOSUCTION BILATERAL CHEST WALL AND ABDOMEN; LIPOFILLING LEFT CHEST;  Surgeon: [name], MD;  Location: [facility];  Service: Plastics;  Laterality: Bilateral;  . MASTECTOMY Left 2019/5  . MASTECTOMY W/ SENTINEL NODE BIOPSY Left 03/18/2018   Procedure: TOTAL LEFT MASTECTOMY WITH AXILLARY SENTINEL LYMPH NODE BIOPSY;  Surgeon: Excell Seltzer,  MD;  Location: [place];  Service: General;  Laterality: Left;  Marland Kitchen MASTOPEXY Right 03/07/2019   Procedure: RIGHT BREAST MASTOPEXY;  Surgeon: [name], MD;  Location: [place];  Service: Plastics;  Laterality: Right;  . REDUCTION MAMMAPLASTY Right 02/2019  . REMOVAL OF TISSUE EXPANDER AND PLACEMENT OF IMPLANT Left 03/07/2019   Procedure: REMOVAL OF LEFT TISSUE EXPANDER AND PLACEMENT OF SILICONE IMPLANT;  Surgeon: [name], MD;   Location: [facility];  Service: Plastics;  Laterality: Left;  . TOOTH EXTRACTION N/A 07/08/2018   Procedure: DENTAL RESTORATION/EXTRACTIONS;  Surgeon: Diona Browner, DDS;  Location: [place];  Service: Oral Surgery;  Laterality: N/A;  . TUBAL LIGATION      Family History  Problem Relation Age of Onset  . Hypertension Mother   . Stroke Mother   . Ovarian cancer Maternal Grandmother        dx 86s; deceased 19s. She declined therapy of any kind including surgery and chemo  . Breast cancer Cousin        dx 28s; currently 45s; daughter of matenral uncle  . Leukemia Maternal Aunt        currently 22  . Stroke Sister     PMHx, SurgHx, SocialHx, FamHx, Medications, and Allergies were reviewed in the Visit Navigator and updated as appropriate.   Patient Active Problem List   Diagnosis Date Noted  . Breast cancer, left breast ([place]) 03/18/2018  . Genetic testing 02/21/2018  . Family history of breast cancer   . Family history of ovarian cancer   . Iron deficiency anemia due to chronic blood loss 02/09/2018  . Malignant neoplasm of upper-outer quadrant of left female breast ([place]) 02/03/2018  . Breast wound, left, subsequent encounter 01/12/2018  . Hypertension   . Anxiety with depression   . Uterine fibroid 09/29/2014  . Menorrhagia 09/29/2014  . Anemia 09/29/2014  . Sinus tachycardia     Social History   Tobacco Use  . Smoking status: Never Smoker  . Smokeless tobacco: Never Used  Vaping Use  . Vaping Use: Never used  Substance Use Topics  . Alcohol use: No  . Drug use: No    Current Medications and Allergies:    Current Outpatient Medications:  .  ALPRAZolam (XANAX) 0.5 MG tablet, Take 1-2 tablets (0.5-1 mg total) by mouth at bedtime as needed for anxiety or sleep., Disp: 60 tablet, Rfl: 0 .  gabapentin (NEURONTIN) 300 MG capsule, Take 1 capsule (300 mg total) by mouth 2 (two) times daily. (Patient taking differently: Take 300 mg by mouth as needed.), Disp: 60  capsule, Rfl: 2 .  ibuprofen (ADVIL) 800 MG tablet, Take 800 mg by mouth 3 (three) times daily., Disp: , Rfl:  .  tamoxifen (NOLVADEX) 20 MG tablet, TAKE 1 TABLET(20 MG) BY MOUTH DAILY, Disp: 30 tablet, Rfl: 5 .  lisinopril-hydrochlorothiazide (ZESTORETIC) 20-25 MG tablet, Take 1 tablet by mouth daily., Disp: 90 tablet, Rfl: 2 .  propranolol (INDERAL) 20 MG tablet, Take 1 tablet (20 mg total) by mouth 3 (three) times daily., Disp: 90 tablet, Rfl: 3  No Known Allergies  Review of Systems   ROS  Negative unless otherwise specified per HPI.  Vitals:   Vitals:   12/17/20 0935  BP: 110/70  Pulse: 98  Temp: 98.2 F (36.8 C)  TempSrc: Temporal  SpO2: 98%  Weight: 191 lb (86.6 kg)  Height: 5' 8.5" (1.74 m)     Body mass index is 28.62 kg/m.  Physical Exam:    Physical Exam Vitals and nursing note reviewed.  Constitutional:      General: She is not in acute distress.    Appearance: She is well-developed. She is not ill-appearing, toxic-appearing or sickly-appearing.  Cardiovascular:     Rate and Rhythm: Normal rate and regular rhythm.     Pulses: Normal pulses.     Heart sounds: Normal heart sounds, S1 normal and S2 normal.     Comments: No LE edema Pulmonary:     Effort: Pulmonary effort is normal.     Breath sounds: Normal breath sounds.  Skin:    General: Skin is warm, dry and intact.  Neurological:     Mental Status: She is alert.     GCS: GCS eye subscore is 4. GCS verbal subscore is 5. GCS motor subscore is 6.  Psychiatric:        Mood and Affect: Mood and affect normal.        Speech: Speech normal.        Behavior: Behavior normal. Behavior is cooperative.      Assessment and Plan:    Tuyet was seen today for hypertension, insomnia, anxiety and depression.  Diagnoses and all orders for this visit:  Primary hypertension Normotensive in office today. Continue lisinopril-hctz 20-25 mg daily. Follow-up in 6 months, sooner if concerns. -     T4,  free -     Hemoglobin A1c -     TSH  Insomnia, unspecified type Improving. Discussed potentially decreasing xanax to 1.5 tablets at night. She states that she is going to trial this. This rx is prescribed by her oncologist.  Anxiety with depression Improving. Uses propranolol BID with some relief. Continue to work on coping mechanisms. I discussed with patient that if they develop any SI, to tell someone immediately and seek medical attention. Follow-up in 6 months, sooner if concerns.  Other orders -     lisinopril-hydrochlorothiazide (ZESTORETIC) 20-25 MG tablet; Take 1 tablet by mouth daily. -     propranolol (INDERAL) 20 MG tablet; Take 1 tablet (20 mg total) by mouth 3 (three) times daily.    CMA or LPN served as scribe during this visit. History, Physical, and Plan performed by medical provider. The above documentation has been reviewed and is accurate and complete.   [name], PA-C [place], [place] 12/17/2020  Follow-up: No follow-ups on file.

## 2020-12-17 NOTE — Telephone Encounter (Signed)
Scheduled follow-up appointment per 2/28 los. Patient is aware. 

## 2020-12-17 NOTE — Patient Instructions (Signed)
It was great to see you!  You are doing so well! I'm proud of you!  Let's update your thyroid and HgbA1c (blood sugar test) today. I will be in touch as soon as they have returned.  Refills sent for 6 months.  Consider reduction of xanax to 1.5 pills nightly.  Follow-up in 6 months.  Take care,  [name] PA-C

## 2020-12-18 ENCOUNTER — Telehealth: Payer: Self-pay

## 2020-12-18 NOTE — Telephone Encounter (Signed)
Patient is calling in wondering if someone can give her a call about the lab results.

## 2020-12-19 ENCOUNTER — Inpatient Hospital Stay: Admission: RE | Admit: 2020-12-19 | Payer: Medicaid Other | Source: Ambulatory Visit

## 2020-12-19 ENCOUNTER — Encounter: Payer: Self-pay | Admitting: Physician Assistant

## 2020-12-19 ENCOUNTER — Other Ambulatory Visit: Payer: Self-pay

## 2020-12-19 ENCOUNTER — Ambulatory Visit
Admission: RE | Admit: 2020-12-19 | Discharge: 2020-12-19 | Disposition: A | Discharge status: Home / Self Care | Payer: Medicaid Other | Source: Ambulatory Visit | Attending: Hematology | Admitting: Hematology

## 2020-12-19 DIAGNOSIS — Z170 Estrogen receptor positive status [ER+]: Secondary | ICD-10-CM

## 2020-12-19 NOTE — Telephone Encounter (Signed)
Spoke to pt told her I saw she sent a message about her labs. I have forwarded the message to [facility]. She is out of the office today and will probable not answer till tomorrow. Pt verbalized understanding.

## 2020-12-20 ENCOUNTER — Other Ambulatory Visit: Payer: Self-pay | Admitting: Physician Assistant

## 2020-12-20 DIAGNOSIS — R7309 Other abnormal glucose: Secondary | ICD-10-CM

## 2020-12-20 DIAGNOSIS — R5383 Other fatigue: Secondary | ICD-10-CM

## 2020-12-20 NOTE — Addendum Note (Signed)
Addended by: Brandy Hale on: 12/20/2020 08:16 AM   Modules accepted: Orders

## 2020-12-23 ENCOUNTER — Ambulatory Visit: Payer: Medicaid Other

## 2020-12-25 ENCOUNTER — Encounter: Payer: Self-pay | Admitting: Physician Assistant

## 2020-12-25 DIAGNOSIS — E8881 Metabolic syndrome: Secondary | ICD-10-CM

## 2020-12-31 MED ORDER — GLUCOSE BLOOD VI STRP: ORAL_STRIP | 12 refills | Status: DC

## 2020-12-31 MED ORDER — TRUE METRIX METER W/DEVICE KIT: PACK | 0 refills | Status: DC

## 2020-12-31 MED ORDER — LANCETS 33G MISC: 4 refills | Status: AC

## 2021-01-03 ENCOUNTER — Other Ambulatory Visit: Payer: Self-pay | Admitting: Hematology

## 2021-01-03 DIAGNOSIS — Z170 Estrogen receptor positive status [ER+]: Secondary | ICD-10-CM

## 2021-01-03 MED ORDER — ALPRAZOLAM 0.5 MG PO TABS: 0.5000 mg | ORAL_TABLET | Freq: Every evening | ORAL | 0 refills | Status: DC | PRN

## 2021-01-03 NOTE — Telephone Encounter (Signed)
Refill request

## 2021-01-13 ENCOUNTER — Ambulatory Visit: Payer: Medicaid Other | Admitting: Registered"

## 2021-01-20 ENCOUNTER — Other Ambulatory Visit: Payer: Self-pay | Admitting: Hematology

## 2021-01-20 DIAGNOSIS — I10 Essential (primary) hypertension: Secondary | ICD-10-CM

## 2021-02-05 ENCOUNTER — Other Ambulatory Visit: Payer: Self-pay | Admitting: Hematology

## 2021-02-05 DIAGNOSIS — Z170 Estrogen receptor positive status [ER+]: Secondary | ICD-10-CM

## 2021-02-05 DIAGNOSIS — C50412 Malignant neoplasm of upper-outer quadrant of left female breast: Secondary | ICD-10-CM

## 2021-02-06 ENCOUNTER — Other Ambulatory Visit: Payer: Self-pay | Admitting: Hematology

## 2021-02-06 DIAGNOSIS — C50412 Malignant neoplasm of upper-outer quadrant of left female breast: Secondary | ICD-10-CM

## 2021-02-06 MED ORDER — ALPRAZOLAM 0.5 MG PO TABS: 0.5000 mg | ORAL_TABLET | Freq: Every evening | ORAL | 0 refills | Status: DC | PRN

## 2021-02-24 ENCOUNTER — Encounter: Payer: Self-pay | Admitting: Hematology

## 2021-02-25 NOTE — Telephone Encounter (Signed)
I spoke with pt and confirmed that rx sent 02/06/21 was for 60 tablets. Pt states she thinks the issue is on the pharmacy end because this isn't the first time this has occurred. Pt states she will contact them and let us know if they are not able to resolve it.

## 2021-03-10 ENCOUNTER — Other Ambulatory Visit: Payer: Self-pay | Admitting: Nurse Practitioner

## 2021-03-20 ENCOUNTER — Other Ambulatory Visit: Payer: Self-pay | Admitting: Hematology

## 2021-03-20 DIAGNOSIS — Z170 Estrogen receptor positive status [ER+]: Secondary | ICD-10-CM

## 2021-03-20 MED ORDER — ALPRAZOLAM 0.5 MG PO TABS: 0.5000 mg | ORAL_TABLET | Freq: Every evening | ORAL | 0 refills | Status: DC | PRN

## 2021-04-14 ENCOUNTER — Other Ambulatory Visit: Payer: Self-pay | Admitting: Hematology

## 2021-04-14 DIAGNOSIS — Z170 Estrogen receptor positive status [ER+]: Secondary | ICD-10-CM

## 2021-04-15 ENCOUNTER — Other Ambulatory Visit: Payer: Self-pay

## 2021-04-15 DIAGNOSIS — Z170 Estrogen receptor positive status [ER+]: Secondary | ICD-10-CM

## 2021-04-15 MED ORDER — ALPRAZOLAM 0.5 MG PO TABS: 0.5000 mg | ORAL_TABLET | Freq: Every evening | ORAL | 0 refills | Status: DC | PRN

## 2021-05-06 ENCOUNTER — Other Ambulatory Visit: Payer: Self-pay | Admitting: Hematology

## 2021-05-20 ENCOUNTER — Ambulatory Visit: Payer: Medicaid Other | Attending: Plastic Surgery | Admitting: Physical Therapy

## 2021-06-04 ENCOUNTER — Other Ambulatory Visit: Payer: Self-pay | Admitting: Hematology

## 2021-06-04 DIAGNOSIS — C50412 Malignant neoplasm of upper-outer quadrant of left female breast: Secondary | ICD-10-CM

## 2021-06-05 ENCOUNTER — Other Ambulatory Visit: Payer: Self-pay | Admitting: Hematology

## 2021-06-05 DIAGNOSIS — Z170 Estrogen receptor positive status [ER+]: Secondary | ICD-10-CM

## 2021-06-05 DIAGNOSIS — C50412 Malignant neoplasm of upper-outer quadrant of left female breast: Secondary | ICD-10-CM

## 2021-06-05 MED ORDER — ALPRAZOLAM 0.5 MG PO TABS: 0.5000 mg | ORAL_TABLET | Freq: Every evening | ORAL | 0 refills | Status: DC | PRN

## 2021-06-12 ENCOUNTER — Telehealth: Payer: Self-pay | Admitting: Hematology

## 2021-06-12 NOTE — Telephone Encounter (Signed)
R/s appts times per 8/24 sch msg. Pt aware.

## 2021-06-13 ENCOUNTER — Inpatient Hospital Stay (HOSPITAL_BASED_OUTPATIENT_CLINIC_OR_DEPARTMENT_OTHER): Payer: Medicaid Other | Admitting: Hematology

## 2021-06-13 ENCOUNTER — Inpatient Hospital Stay: Payer: Medicaid Other | Admitting: Hematology

## 2021-06-13 ENCOUNTER — Encounter: Payer: Self-pay | Admitting: Hematology

## 2021-06-13 ENCOUNTER — Inpatient Hospital Stay: Payer: Medicaid Other | Attending: Hematology

## 2021-06-13 ENCOUNTER — Other Ambulatory Visit: Payer: Self-pay

## 2021-06-13 ENCOUNTER — Inpatient Hospital Stay: Payer: Medicaid Other

## 2021-06-13 VITALS — BP 130/88 | HR 99 | Temp 98.4°F | Resp 18 | Ht 68.5 in | Wt 179.0 lb

## 2021-06-13 DIAGNOSIS — N951 Menopausal and female climacteric states: Secondary | ICD-10-CM | POA: Insufficient documentation

## 2021-06-13 DIAGNOSIS — Z79810 Long term (current) use of selective estrogen receptor modulators (SERMs): Secondary | ICD-10-CM | POA: Insufficient documentation

## 2021-06-13 DIAGNOSIS — I10 Essential (primary) hypertension: Secondary | ICD-10-CM | POA: Diagnosis not present

## 2021-06-13 DIAGNOSIS — Z170 Estrogen receptor positive status [ER+]: Secondary | ICD-10-CM | POA: Diagnosis not present

## 2021-06-13 DIAGNOSIS — R7303 Prediabetes: Secondary | ICD-10-CM | POA: Insufficient documentation

## 2021-06-13 DIAGNOSIS — C50412 Malignant neoplasm of upper-outer quadrant of left female breast: Secondary | ICD-10-CM

## 2021-06-13 DIAGNOSIS — F32A Depression, unspecified: Secondary | ICD-10-CM | POA: Diagnosis not present

## 2021-06-13 DIAGNOSIS — F419 Anxiety disorder, unspecified: Secondary | ICD-10-CM | POA: Diagnosis not present

## 2021-06-13 LAB — CBC WITH DIFFERENTIAL (CANCER CENTER ONLY)
Abs Immature Granulocytes: 0.01 10*3/uL (ref 0.00–0.07)
Basophils Absolute: 0 10*3/uL (ref 0.0–0.1)
Basophils Relative: 1 %
Eosinophils Absolute: 0 10*3/uL (ref 0.0–0.5)
Eosinophils Relative: 1 %
HCT: 40.6 % (ref 36.0–46.0)
Hemoglobin: 14 g/dL (ref 12.0–15.0)
Immature Granulocytes: 0 %
Lymphocytes Relative: 42 %
Lymphs Abs: 1.5 10*3/uL (ref 0.7–4.0)
MCH: 28.9 pg (ref 26.0–34.0)
MCHC: 34.5 g/dL (ref 30.0–36.0)
MCV: 83.9 fL (ref 80.0–100.0)
Monocytes Absolute: 0.4 10*3/uL (ref 0.1–1.0)
Monocytes Relative: 13 %
Neutro Abs: 1.5 10*3/uL — ABNORMAL LOW (ref 1.7–7.7)
Neutrophils Relative %: 43 %
Platelet Count: 301 10*3/uL (ref 150–400)
RBC: 4.84 MIL/uL (ref 3.87–5.11)
RDW: 13.3 % (ref 11.5–15.5)
WBC Count: 3.5 10*3/uL — ABNORMAL LOW (ref 4.0–10.5)
nRBC: 0 % (ref 0.0–0.2)

## 2021-06-13 LAB — CMP (CANCER CENTER ONLY)
ALT: 13 U/L (ref 0–44)
AST: 14 U/L — ABNORMAL LOW (ref 15–41)
Albumin: 4.1 g/dL (ref 3.5–5.0)
Alkaline Phosphatase: 49 U/L (ref 38–126)
Anion gap: 10 (ref 5–15)
BUN: 14 mg/dL (ref 6–20)
CO2: 26 mmol/L (ref 22–32)
Calcium: 9.9 mg/dL (ref 8.9–10.3)
Chloride: 101 mmol/L (ref 98–111)
Creatinine: 0.98 mg/dL (ref 0.44–1.00)
GFR, Estimated: >60 mL/min (ref >60)
Glucose, Bld: 108 mg/dL — ABNORMAL HIGH (ref 70–99)
Potassium: 4.3 mmol/L (ref 3.5–5.1)
Sodium: 137 mmol/L (ref 135–145)
Total Bilirubin: 0.6 mg/dL (ref 0.3–1.2)
Total Protein: 7.9 g/dL (ref 6.5–8.1)

## 2021-06-13 NOTE — Progress Notes (Signed)
[facility]   Telephone:([phone] Fax:([phone]) [phone]   Clinic Follow up Note   Patient Care Team: [name], [place] as PCP - General (Physician Assistant) Excell Seltzer, MD (Inactive) as Consulting Physician (General Surgery) Truitt Merle, MD as Consulting Physician (Hematology) Gery Pray, MD as Consulting Physician (Radiation Oncology) Alla Feeling, NP as Nurse Practitioner (Nurse Practitioner)  Date of Service:  06/13/2021  CHIEF COMPLAINT: f/u of left breast DCIS  ASSESSMENT & PLAN:  Casey Baker is a 50 y.o. female with   1.  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 Treated with left total mastectomy and breast reconstruction and radiation. Currently on adjuvant Tamoxifen. -I discussed her switching to AI for an additional 5 years when she becomes post-menopausal. She would like to continue Tamoxifen. She is willing to have lab work done and discuss it at her next visit. -most recent right mammogram on 12/19/20 was negative. -She is clinically doing well. Lab reviewed, her CBC and CMP are within normal limits, except BG 108. Her physical exam was unremarkable. There is no clinical concern for recurrence. -Continue surveillance. Next right breast Mammogram in 12/2021 -Continue Tamoxifen, she is not interested in switching to AI -f/u in 6 months     2. Hot Flashes, depression and anxiety, insomnia -These symptoms were noted before starting Tamoxifen. -With work, cancer diagnosis and Tamoxifen her anxiety and depression has increased.  -She has tried gabapentin, Melatonin, Ambien but without much relief. She tried to Effexor but stopped due to N&V. She tried Celexa 61m but stopped after 1 month due to drowsiness. She is currently on gabapentin. -She is currently on Xanax  0.529m2 tabs nightly. Anxiety and depression improved. Today she denies talking to a counselor and declines referral.    3. HTN, poorly controlled, Elevated heart rate -She is currently on Lisinopril -BP improved with her improved depression, anxiety and stress.    4. Pre-diabetes  -She has been told she is pre-diabetic. She has changed her diet and lost about 10-12 lbs in the last 6 months, which concerns her. -I advised her she can safely lose another 20 lbs to be in the healthy range. I recommended she eat better and supplement with low-sugar nutritional supplements. I encouraged her to increase her vegetable consumption.    Plan  -Continue Tamoxifen  -Lab and f/u in 6 months  -Right breast Mammogram in 12/2021     No problem-specific Assessment & Plan notes found for this encounter.   SUMMARY OF ONCOLOGIC HISTORY: Oncology History Overview Note  Cancer Staging Ductal carcinoma in situ (DCIS) of left breast Staging form: Breast, AJCC 8th Edition - Clinical stage from 02/09/2018: Stage 0 (cTis (DCIS), cN0, cM0, ER+, PR+) - Unsigned - Pathologic stage from 03/18/2018: Stage IA (pT1m67mpN1a, cM0, G1, ER+, PR+, HER2-) - Signed by FenTruitt MerleD on 03/28/2018     Malignant neoplasm of upper-outer quadrant of left female breast ([facility]4/[facility]/2018 Mammogram   IMPRESSION: Suspicious bloody left nipple discharge with palpable masses in the 2 o'clock axis of the left breast. Findings are suspicious for Malignancy. ADDENDUM: Magnification views of the retroareolar right breast were performed to evaluate calcifications seen on the patient's baseline mammogram. There are loosely grouped and scattered calcifications in the outer right breast, superior to and directly posterior to the nipple. These calcifications  are rounded and smudgy in the CC projection, and many of them demonstrate layering on the 90 degree lateral magnification view, consistent with benign milk of calcium.  No suspicious microcalcifications are seen in the right breast.   01/31/2018 Initial Biopsy   Diagnosis 01/31/18 1. 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 - NEGATIVE FOR INVASIVE CARCINOMA. 2. 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 - NEGATIVE FOR INVASIVE CARCINOMA.   01/31/2018 Receptors her2   Prognostic indicators significant for: ER, 100% positive and PR, 100% positive, both with strong staining intensity.    02/03/2018 Initial Diagnosis   Ductal carcinoma in situ (DCIS) of left breast   02/10/2018 Genetic Testing   Testing did not reveal a pathogenic mutation in any of the genes analyzed. A copy of the genetic test report will be scanned into Epic under the Media tab.  The genes analyzed were the 23 genes on [facility]'s Breast/GYN panel (ATM, BARD1, BRCA1, BRCA2, BRIP1, CDH1, CHEK2, DICER1, EPCAM, MLH1,  MSH2, MSH6, NBN, NF1, PALB2, PMS2, PTEN, RAD50, RAD51C, RAD51D,SMARCA4, STK11, and TP53).  Genetic testing involved analysis of 9 genes: ATM, BRCA1, BRCA2, CDH1, CHEK2, PALB2, PTEN, STK11 and TP53 genes. Testing was normal and did not reveal a mutation in these genes.     03/18/2018 Surgery   TOTAL LEFT MASTECTOMY WITH AXILLARY SENTINEL LYMPH NODE BIOPSY and  LEFT BREAST RECONSTRUCTION WITH PLACEMENT OF TISSUE EXPANDER AND FLEX HD (ACELLULAR HYDRATED DERMIS) by Dr. Excell Seltzer and Dr. Iran Planas  03/18/18   03/18/2018 Pathology Results   Diagnosis 03/18/18 1. Lymph node, sentinel, biopsy, Left Axillary #1 - METASTATIC CARCINOMA IN ONE OF ONE LYMPH NODES (1/1). 2. Lymph node, sentinel, biopsy, Left Axillary #2 - ONE OF ONE LYMPH NODES NEGATIVE FOR CARCINOMA (0/1). 3. Breast, simple mastectomy, Left Total - MICROINVASIVE DUCTAL CARCINOMA ARISING IN A BACKGROUND OF LOW GRADE DUCTAL  CARCINOMA IN SITU. - DUCTAL CARCINOMA IN SITU PARTIALLY INVOLVES AN INTRADUCTAL PAPILLOMA. - RESECTION MARGINS ARE NEGATIVE FOR INVASIVE CARCINOMA. - IN SITU CARCINOMA IS PRESENT AT THE ANTERIOR MARGIN BROADLY. - BIOPSY SITE. - SEE ONCOLOGY TABLE.    03/18/2018 Receptors her2   Estrogen Receptor: 100%, POSITIVE, MODERATE-WEAK STAINING INTENSITY Progesterone Receptor: 100%, POSITIVE, STRONG STAINING INTENSITY Proliferation Marker Ki67: 1% HER2 - NEGATIVE   03/18/2018 Cancer Staging   Staging form: Breast, AJCC 8th Edition - Pathologic stage from 03/18/2018: Stage IA (pT67m, pN1a, cM0, G1, ER+, PR+, HER2-) - Signed by [name] on 03/28/2018   05/18/2018 - 06/27/2018 Radiation Therapy   Adjuvant radiation by Dr. KSondra Come   06/27/2018 Mammogram   06/27/2018 Mammogram IMPRESSION: Benign appearing right breast calcifications.   Recommendation: Screening right mammogram is suggested in April 2020.   07/19/2018 -  Anti-estrogen oral therapy   Adjuvant tamoxifen     Survivorship   Per LCira Rue NP    03/07/2019 Surgery   REMOVAL OF LEFT TISSUE EXPANDER AND PLACEMENT OF SILICONE IMPLANT and LIPOFILLING FROM ABDOMEN TO LEFT CHEST and RIGHT BREAST MASTOPEXY by Dr. [name] 03/07/19   06/14/2020 Surgery   REVISION RIGHT BREAST REDUCTION and LIPOSUCTION BILATERAL CHEST WALL AND ABDOMEN; LIPOFILLING LEFT CHEST by Dr [name]      CURRENT THERAPY:  Adjuvant Tamoxifen, started in early Oct 2019  INTERVAL HISTORY:  Casey BERLINGis here for a follow up  of DCIS. She was last seen by me on 12/16/20. She presents to the clinic alone. She reports she is doing well and has lost some weight. She notes she is trying to cut out sugar, but she feels this is the majority of her diet. She is now concerned about anything she eats and is afraid to eat. She expressed concern that she has lost too much weight.  She reports anxiety, which is also affecting her diet/food intake. She notes it worsens  prior to seeing doctors. She denies talking to a counselor and refuses referral. She also reports stiffness in her hands, and she notices it most after she wakes up. She notes she has not had a period in about 2 years. She notes she took a vacation with her youngest daughter to [place] in May. (She has three daughters-- ages 68, 41, and 77-- and one granddaughter-- almost age 93.)   All other systems were reviewed with the patient and are negative.  MEDICAL HISTORY:  Past Medical History:  Diagnosis Date   Anemia    required iron transfusions while having periods   Anxiety    Breast cancer ([place]) 2019   left breast   Ductal carcinoma in situ (DCIS) of left breast 02/03/2018   Family history of breast cancer    Family history of ovarian cancer    Genetic testing 02/21/2018   STAT Breast panel with reflex to Breast/GYN panel (23 genes) @ [facility] - No pathogenic mutations detected   Hypertension    PONV (postoperative nausea and vomiting)    Uterine fibroid    Vaginal delivery 1992, 1994, 1997    SURGICAL HISTORY: Past Surgical History:  Procedure Laterality Date   AXILLARY SURGERY     BREAST RECONSTRUCTION WITH PLACEMENT OF TISSUE EXPANDER AND FLEX HD (ACELLULAR HYDRATED DERMIS) Left 03/18/2018   Procedure: LEFT BREAST RECONSTRUCTION WITH PLACEMENT OF TISSUE EXPANDER AND FLEX HD (ACELLULAR HYDRATED DERMIS);  Surgeon: [name], MD;  Location: [facility];  Service: Plastics;  Laterality: Left;   BREAST REDUCTION SURGERY Right 06/14/2020   Procedure: REVISION RIGHT BREAST REDUCTION;  Surgeon: [name], MD;  Location: [facility];  Service: Plastics;  Laterality: Right;   DENTAL SURGERY N/A 07/08/2018   KNEE ARTHROSCOPY Left 2013   LIPOSUCTION WITH LIPOFILLING Left 03/07/2019   Procedure: LIPOFILLING FROM ABDOMEN TO LEFT CHEST;  Surgeon: [name], MD;  Location: [facility];  Service: Plastics;  Laterality: Left;    LIPOSUCTION WITH LIPOFILLING Bilateral 06/14/2020   Procedure: LIPOSUCTION BILATERAL CHEST WALL AND ABDOMEN; LIPOFILLING LEFT CHEST;  Surgeon: [name], MD;  Location: [facility];  Service: Plastics;  Laterality: Bilateral;   MASTECTOMY Left 2019/5   MASTECTOMY W/ SENTINEL NODE BIOPSY Left 03/18/2018   Procedure: TOTAL LEFT MASTECTOMY WITH AXILLARY SENTINEL LYMPH NODE BIOPSY;  Surgeon: Excell Seltzer, MD;  Location: [facility];  Service: General;  Laterality: Left;   MASTOPEXY Right 03/07/2019   Procedure: RIGHT BREAST MASTOPEXY;  Surgeon: [name], MD;  Location: [facility];  Service: Plastics;  Laterality: Right;   REDUCTION MAMMAPLASTY Right 02/2019   REMOVAL OF TISSUE EXPANDER AND PLACEMENT OF IMPLANT Left 03/07/2019   Procedure: REMOVAL OF LEFT TISSUE EXPANDER AND PLACEMENT OF SILICONE IMPLANT;  Surgeon: [name], MD;  Location: [facility];  Service: Plastics;  Laterality: Left;   TOOTH EXTRACTION N/A 07/08/2018   Procedure: DENTAL RESTORATION/EXTRACTIONS;  Surgeon: Diona Browner, DDS;  Location: [facility];  Service: Oral Surgery;  Laterality: N/A;   TUBAL LIGATION      I have reviewed the social history and family history with the patient and they are unchanged from previous note.  ALLERGIES:  has No Known Allergies.  MEDICATIONS:  Current Outpatient Medications  Medication Sig Dispense Refill   ALPRAZolam (XANAX) 0.5 MG tablet Take 1-2 tablets (0.5-1 mg total) by mouth at bedtime as needed for anxiety or sleep. 60 tablet 0   Blood Glucose Monitoring Suppl (TRUE METRIX METER) w/Device KIT USE TO CHECK BLOOD SUGAR DAILY AND PRN 1 kit 0   gabapentin (NEURONTIN) 300 MG capsule TAKE 1 CAPSULE(300 MG) BY MOUTH TWICE DAILY 60 capsule 2   glucose blood test strip USE TO CHECK BLOOD SUGAR DAILY AND PRN 100 each 12   ibuprofen (ADVIL) 800 MG tablet Take 800 mg by mouth 3 (three) times daily.     Lancets 33G MISC  USE TO CHECK BLOOD SUGAR DAILY AND PRN 100 each 4   lisinopril-hydrochlorothiazide (ZESTORETIC) 20-25 MG tablet Take 1 tablet by mouth daily. 90 tablet 2   propranolol (INDERAL) 20 MG tablet Take 1 tablet (20 mg total) by mouth 3 (three) times daily. 90 tablet 3   tamoxifen (NOLVADEX) 20 MG tablet TAKE 1 TABLET(20 MG) BY MOUTH DAILY 30 tablet 5   No current facility-administered medications for this visit.    PHYSICAL EXAMINATION: ECOG PERFORMANCE STATUS: 0 - Asymptomatic  Vitals:   06/13/21 0851  BP: 130/88  Pulse: 99  Resp: 18  Temp: 98.4 F (36.9 C)  SpO2: 98%   Wt Readings from Last 3 Encounters:  06/13/21 179 lb (81.2 kg)  12/17/20 191 lb (86.6 kg)  12/16/20 190 lb 4.8 oz (86.3 kg)     GENERAL:alert, no distress and comfortable SKIN: skin color, texture, turgor are normal, no rashes or significant lesions EYES: normal, Conjunctiva are pink and non-injected, sclera clear  NECK: supple, thyroid normal size, non-tender, without nodularity LYMPH:  no palpable lymphadenopathy in the cervical, axillary  LUNGS: clear to auscultation and percussion with normal breathing effort HEART: regular rate & rhythm and no murmurs and no lower extremity edema ABDOMEN:abdomen soft, non-tender and normal bowel sounds Musculoskeletal:no cyanosis of digits and no clubbing  NEURO: alert & oriented x 3 with fluent speech, no focal motor/sensory deficits BREAST: No palpable mass, nodules or adenopathy bilaterally. Breast exam benign.   LABORATORY DATA:  I have reviewed the data as listed CBC Latest Ref Rng & Units 06/13/2021 12/16/2020 08/14/2020  WBC 4.0 - 10.5 K/uL 3.5(L) 4.4 4.3  Hemoglobin 12.0 - 15.0 g/dL 14.0 13.5 13.8  Hematocrit 36.0 - 46.0 % 40.6 40.1 41.3  Platelets 150 - 400 K/uL 301 282 273     CMP Latest Ref Rng & Units 06/13/2021 12/16/2020 08/14/2020  Glucose 70 - 99 mg/dL 108(H) 108(H) 113(H)  BUN 6 - 20 mg/dL _0 Creatinine 0.44 - 1.00 mg/dL 0.98 0.99 0.84  Sodium 135  - 145 mmol/L 137 136 141  Potassium 3.5 - 5.1 mmol/L 4.3 4.0 3.9  Chloride 98 - 111 mmol/L 101 102 106  CO2 22 - 32 mmol/L _1 Calcium 8.9 - 10.3 mg/dL 9.9 9.4 9.5  Total Protein 6.5 - 8.1 g/dL 7.9 7.7 7.3  Total Bilirubin 0.3 - 1.2 mg/dL 0.6 0.6 0.4  Alkaline Phos 38 - 126 U/L 49 63 61  AST 15 - 41 U/L 14(L) 14(L) 15  ALT 0 - 44 U/L 13 11 13  RADIOGRAPHIC STUDIES: I have personally reviewed the radiological images as listed and agreed with the findings in the report. No results found.    No orders of the defined types were placed in this encounter.  All questions were answered. The patient knows to call the clinic with any problems, questions or concerns. No barriers to learning was detected. The total time spent in the appointment was 30 minutes.     Truitt Merle, MD 06/13/2021   I, Wilburn Mylar, am acting as scribe for Truitt Merle, MD.   I have reviewed the above documentation for accuracy and completeness, and I agree with the above.

## 2021-06-16 ENCOUNTER — Telehealth: Payer: Self-pay | Admitting: Hematology

## 2021-06-16 NOTE — Telephone Encounter (Signed)
Scheduled follow-up appointment per 8/26 los. Patient is aware.

## 2021-06-24 ENCOUNTER — Ambulatory Visit: Payer: Medicaid Other | Admitting: Physician Assistant

## 2021-06-26 ENCOUNTER — Encounter: Payer: Self-pay | Admitting: Physician Assistant

## 2021-07-01 ENCOUNTER — Other Ambulatory Visit: Payer: Self-pay | Admitting: Hematology

## 2021-07-01 DIAGNOSIS — Z170 Estrogen receptor positive status [ER+]: Secondary | ICD-10-CM

## 2021-07-02 ENCOUNTER — Encounter: Payer: Self-pay | Admitting: Physician Assistant

## 2021-07-02 ENCOUNTER — Other Ambulatory Visit: Payer: Self-pay | Admitting: Hematology

## 2021-07-02 DIAGNOSIS — C50412 Malignant neoplasm of upper-outer quadrant of left female breast: Secondary | ICD-10-CM

## 2021-07-03 ENCOUNTER — Other Ambulatory Visit: Payer: Self-pay | Admitting: Hematology

## 2021-07-03 DIAGNOSIS — Z170 Estrogen receptor positive status [ER+]: Secondary | ICD-10-CM

## 2021-07-03 DIAGNOSIS — C50412 Malignant neoplasm of upper-outer quadrant of left female breast: Secondary | ICD-10-CM

## 2021-07-03 MED ORDER — ALPRAZOLAM 0.5 MG PO TABS: 0.5000 mg | ORAL_TABLET | Freq: Every evening | ORAL | 0 refills | Status: DC | PRN

## 2021-07-10 ENCOUNTER — Other Ambulatory Visit: Payer: Self-pay

## 2021-07-10 ENCOUNTER — Ambulatory Visit (HOSPITAL_COMMUNITY)
Admission: EM | Admit: 2021-07-10 | Discharge: 2021-07-10 | Disposition: A | Discharge status: Home / Self Care | Payer: Medicaid Other | Attending: Emergency Medicine | Admitting: Emergency Medicine

## 2021-07-10 ENCOUNTER — Encounter (HOSPITAL_COMMUNITY): Payer: Self-pay

## 2021-07-10 DIAGNOSIS — T7840XA Allergy, unspecified, initial encounter: Secondary | ICD-10-CM

## 2021-07-10 DIAGNOSIS — S50862A Insect bite (nonvenomous) of left forearm, initial encounter: Secondary | ICD-10-CM | POA: Diagnosis not present

## 2021-07-10 DIAGNOSIS — W57XXXA Bitten or stung by nonvenomous insect and other nonvenomous arthropods, initial encounter: Secondary | ICD-10-CM | POA: Diagnosis not present

## 2021-07-10 MED ORDER — CEPHALEXIN 500 MG PO CAPS: 500.0000 mg | ORAL_CAPSULE | Freq: Four times a day (QID) | ORAL | 0 refills | Status: DC

## 2021-07-10 MED ORDER — HYDROXYZINE HCL 25 MG PO TABS: 25.0000 mg | ORAL_TABLET | Freq: Four times a day (QID) | ORAL | 0 refills | Status: DC | PRN

## 2021-07-10 NOTE — ED Provider Notes (Addendum)
[place]    CSN: [id number] Arrival date & time: 07/10/21  1159      History   Chief Complaint Chief Complaint  Patient presents with   Insect Bite    HPI Casey Baker is a 50 y.o. female.  She reports ant bites to her feet on 07/05/2021.  2 days later noticed a small bite on her left forearm; she assumes she was bitten by an ant there as well but did not notice it right away.  Yesterday area became more swollen and itchy and red.  Minimally tender.  Patient tried icing the area with no relief.  Patient also tried hydrocortisone cream to the area which did not help it.  She drew around the red area today in pen this morning at 7 AM.  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   Family history of breast cancer    Family history of ovarian cancer    Genetic testing 02/21/2018   STAT Breast panel with reflex to Breast/GYN panel (23 genes) @ [facility] - No pathogenic mutations detected   Hypertension    PONV (postoperative nausea and vomiting)    Uterine fibroid    Vaginal delivery 1992, 1994, 1997    Patient Active Problem List   Diagnosis Date Noted   Insulin resistance 12/17/2020   Breast cancer, left breast ([place]) 03/18/2018   Genetic testing 02/21/2018   Family history of breast cancer    Family history of ovarian cancer    Iron deficiency anemia due to chronic blood loss 02/09/2018   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   Breast wound, left, subsequent encounter 01/12/2018   Hypertension    Anxiety with depression    Uterine fibroid 09/29/2014   Menorrhagia 09/29/2014   Anemia 09/29/2014   Sinus tachycardia     Past Surgical History:  Procedure Laterality Date   AXILLARY SURGERY     BREAST RECONSTRUCTION WITH PLACEMENT OF TISSUE  EXPANDER AND FLEX HD (ACELLULAR HYDRATED DERMIS) Left 03/18/2018   Procedure: LEFT BREAST RECONSTRUCTION WITH PLACEMENT OF TISSUE EXPANDER AND FLEX HD (ACELLULAR HYDRATED DERMIS);  Surgeon: [name], MD;  Location: [place];  Service: Plastics;  Laterality: Left;   BREAST REDUCTION SURGERY Right 06/14/2020   Procedure: REVISION RIGHT BREAST REDUCTION;  Surgeon: [name], MD;  Location: [place];  Service: Plastics;  Laterality: Right;   DENTAL SURGERY N/A 07/08/2018   KNEE ARTHROSCOPY Left 2013   LIPOSUCTION WITH LIPOFILLING Left 03/07/2019   Procedure: LIPOFILLING FROM ABDOMEN TO LEFT CHEST;  Surgeon: [name], MD;  Location: [facility];  Service: Plastics;  Laterality: Left;   LIPOSUCTION WITH LIPOFILLING Bilateral 06/14/2020   Procedure: LIPOSUCTION BILATERAL CHEST WALL AND ABDOMEN; LIPOFILLING LEFT CHEST;  Surgeon: [name], MD;  Location: [facility];  Service: Plastics;  Laterality: Bilateral;   MASTECTOMY Left 2019/5   MASTECTOMY W/ SENTINEL NODE BIOPSY Left 03/18/2018   Procedure: TOTAL LEFT MASTECTOMY WITH AXILLARY SENTINEL LYMPH NODE BIOPSY;  Surgeon: Excell Seltzer, MD;  Location: [place];  Service: General;  Laterality: Left;   MASTOPEXY Right 03/07/2019   Procedure: RIGHT BREAST MASTOPEXY;  Surgeon: [name], MD;  Location: [facility];  Service: Clinical cytogeneticist;  Laterality: Right;   REDUCTION MAMMAPLASTY Right 02/2019   REMOVAL OF TISSUE EXPANDER AND PLACEMENT OF IMPLANT Left 03/07/2019   Procedure: REMOVAL OF LEFT TISSUE EXPANDER AND PLACEMENT OF SILICONE IMPLANT;  Surgeon: [name], MD;  Location: [facility];  Service: Plastics;  Laterality: Left;   TOOTH EXTRACTION N/A 07/08/2018   Procedure: DENTAL RESTORATION/EXTRACTIONS;  Surgeon: Diona Browner, DDS;  Location: [facility];  Service: Oral Surgery;  Laterality: N/A;   TUBAL LIGATION       OB History     Gravida  6   Para      Term      Preterm      AB  3   Living  3      SAB  2   IAB  1   Ectopic      Multiple      Live Births  3        Obstetric Comments  SVD x 3          Home Medications    Prior to Admission medications   Medication Sig Start Date End Date Taking? Authorizing Provider  cephALEXin (KEFLEX) 500 MG capsule Take 1 capsule (500 mg total) by mouth 4 (four) times daily. 07/10/21  Yes Carvel Getting, NP  hydrOXYzine (ATARAX/VISTARIL) 25 MG tablet Take 1 tablet (25 mg total) by mouth every 6 (six) hours as needed. 07/10/21  Yes Carvel Getting, NP  ALPRAZolam Duanne Moron) 0.5 MG tablet Take 1-2 tablets (0.5-1 mg total) by mouth at bedtime as needed for anxiety or sleep. 07/03/21   Truitt Merle, MD  Blood Glucose Monitoring Suppl (TRUE METRIX METER) w/Device KIT USE TO CHECK BLOOD SUGAR DAILY AND PRN 12/31/20   [name], PA  gabapentin (NEURONTIN) 300 MG capsule TAKE 1 CAPSULE(300 MG) BY MOUTH TWICE DAILY 05/06/21   Truitt Merle, MD  glucose blood test strip USE TO CHECK BLOOD SUGAR DAILY AND PRN 12/31/20   [name], PA  ibuprofen (ADVIL) 800 MG tablet Take 800 mg by mouth 3 (three) times daily. Patient not taking: Reported on 07/10/2021 10/30/20   [provider]  Lancets 33G MISC USE TO CHECK BLOOD SUGAR DAILY AND PRN 12/31/20   [name], PA  lisinopril-hydrochlorothiazide (ZESTORETIC) 20-25 MG tablet Take 1 tablet by mouth daily. 12/17/20   [name], PA  propranolol (INDERAL) 20 MG tablet Take 1 tablet (20 mg total) by mouth 3 (three) times daily. 12/17/20   [name], PA  tamoxifen (NOLVADEX) 20 MG tablet TAKE 1 TABLET(20 MG) BY MOUTH DAILY 03/10/21   Truitt Merle, MD    Family History Family History  Problem Relation Age of Onset   Hypertension Mother    Stroke Mother    Ovarian cancer Maternal Grandmother        dx 90s; deceased 63s. She declined therapy of any kind including surgery and chemo    Breast cancer Cousin        dx 3s; currently 66s; daughter of matenral uncle   Leukemia Maternal Aunt        currently 93   Stroke Sister     Social History Social History   Tobacco Use   Smoking status: Never   Smokeless tobacco: Never  Vaping Use   Vaping Use: Never used  Substance Use Topics   Alcohol use: No   Drug use: No     Allergies   Patient has no known allergies.   Review of Systems  Review of Systems  Constitutional:  Negative for chills and fever.  Respiratory:  Negative for shortness of breath and wheezing.   Skin:  Positive for color change.    Physical Exam Triage Vital Signs ED Triage Vitals  Enc Vitals Group     BP 07/10/21 1355 121/83     Pulse Rate 07/10/21 1355 (!) 111     Resp 07/10/21 1355 14     Temp 07/10/21 1355 98.5 F (36.9 C)     Temp Source 07/10/21 1355 Oral     SpO2 07/10/21 1355 97 %     Weight --      Height --      Head Circumference --      Peak Flow --      Pain Score 07/10/21 1357 1     Pain Loc --      Pain Edu? --      Excl. in [place]? --    No data found.  Updated Vital Signs BP 121/83 (BP Location: Right Arm)   Pulse (!) 111   Temp 98.5 F (36.9 C) (Oral)   Resp 14   LMP  (LMP Unknown) Comment: pt reports last period 11 months ago  SpO2 97%   Visual Acuity Right Eye Distance:   Left Eye Distance:   Bilateral Distance:    Right Eye Near:   Left Eye Near:    Bilateral Near:     Physical Exam Constitutional:      General: She is not in acute distress.    Appearance: Normal appearance. She is not ill-appearing.  Pulmonary:     Effort: Pulmonary effort is normal.  Skin:    Comments: 6 cm diameter area of erythema and mild edema on the left anterior forearm with a tiny, superficial puncture wound in the center.  Erythema has not spread outside of the ink markings patient placed this morning at 7 AM.  Minimally tender to palpation.  Patient reports it itches more than hurts.  Neurological:     Mental  Status: She is alert.     UC Treatments / Results  Labs (all labs ordered are listed, but only abnormal results are displayed) Labs Reviewed - No data to display  EKG   Radiology No results found.  Procedures Procedures (including critical care time)  Medications Ordered in UC Medications - No data to display  Initial Impression / Assessment and Plan / UC Course  I have reviewed the triage vital signs and the nursing notes.  Pertinent labs & imaging results that were available during my care of the patient were reviewed by me and considered in my medical decision making (see chart for details).  Patient is worried about an infection but I do not perceive an infection at this time.  I think this is a delayed local reaction to an insect bite.  I marked around the area of erythema with a tissue marker.  Recommended Zyrtec or Allegra for itching; prescribed hydroxyzine for the patient to fill if she feels Zyrtec and Allegra are not sufficient to control itching.  Asked patient to add Pepcid or famotidine every day until symptoms resolve.  Although I do not see an infection at this time, patient is worried about wound and therefore I prescribed Keflex 500 mg 4 times daily for 5 days for patient to fill only if erythema exceeds ink borders and area becomes increasingly tender, warm, and swollen   Final Clinical Impressions(s) / UC Diagnoses   Final  diagnoses:  Insect bite of left forearm, initial encounter  Allergic reaction, initial encounter     Discharge Instructions      Use over-the-counter Zyrtec or Allegra as directed on the package to help relieve the itching, redness, and swelling.  If this is not sufficient, you can get the prescription for hydroxyzine filled and use it instead-just know that it can be very sedating.  You do not need to get the prescription for the antibiotics Keflex filled unless you feel like the redness is spreading and the area is becoming much more  painful.     ED Prescriptions     Medication Sig Dispense Auth. Provider   hydrOXYzine (ATARAX/VISTARIL) 25 MG tablet Take 1 tablet (25 mg total) by mouth every 6 (six) hours as needed. 16 tablet Carvel Getting, NP   cephALEXin (KEFLEX) 500 MG capsule Take 1 capsule (500 mg total) by mouth 4 (four) times daily. 20 capsule Carvel Getting, NP      PDMP not reviewed this encounter.   Carvel Getting, NP 07/10/21 1442    Carvel Getting, NP 07/10/21 [id number]

## 2021-07-10 NOTE — ED Triage Notes (Signed)
Pt presents with insect bite to left forearm and left foot. Pt states she was bit by ants on Saturday.

## 2021-07-10 NOTE — Discharge Instructions (Addendum)
Use over-the-counter Zyrtec or Allegra as directed on the package to help relieve the itching, redness, and swelling.  If this is not sufficient, you can get the prescription for hydroxyzine filled and use it instead-just know that it can be very sedating.  You do not need to get the prescription for the antibiotics Keflex filled unless you feel like the redness is spreading and the area is becoming much more painful.

## 2021-08-07 ENCOUNTER — Other Ambulatory Visit: Payer: Self-pay | Admitting: Hematology

## 2021-08-07 DIAGNOSIS — Z170 Estrogen receptor positive status [ER+]: Secondary | ICD-10-CM

## 2021-08-07 DIAGNOSIS — C50412 Malignant neoplasm of upper-outer quadrant of left female breast: Secondary | ICD-10-CM

## 2021-08-08 MED ORDER — ALPRAZOLAM 0.5 MG PO TABS: 0.5000 mg | ORAL_TABLET | Freq: Every evening | ORAL | 0 refills | Status: DC | PRN

## 2021-09-16 ENCOUNTER — Other Ambulatory Visit: Payer: Self-pay | Admitting: Hematology

## 2021-09-16 DIAGNOSIS — Z170 Estrogen receptor positive status [ER+]: Secondary | ICD-10-CM

## 2021-09-16 DIAGNOSIS — C50412 Malignant neoplasm of upper-outer quadrant of left female breast: Secondary | ICD-10-CM

## 2021-09-17 ENCOUNTER — Other Ambulatory Visit: Payer: Self-pay | Admitting: Hematology

## 2021-09-17 DIAGNOSIS — Z170 Estrogen receptor positive status [ER+]: Secondary | ICD-10-CM

## 2021-09-17 DIAGNOSIS — C50412 Malignant neoplasm of upper-outer quadrant of left female breast: Secondary | ICD-10-CM

## 2021-09-18 ENCOUNTER — Other Ambulatory Visit: Payer: Self-pay | Admitting: Hematology

## 2021-09-18 DIAGNOSIS — C50412 Malignant neoplasm of upper-outer quadrant of left female breast: Secondary | ICD-10-CM

## 2021-09-18 DIAGNOSIS — Z170 Estrogen receptor positive status [ER+]: Secondary | ICD-10-CM

## 2021-09-18 MED ORDER — ALPRAZOLAM 0.5 MG PO TABS: 0.5000 mg | ORAL_TABLET | Freq: Every evening | ORAL | 0 refills | Status: DC | PRN

## 2021-10-01 ENCOUNTER — Other Ambulatory Visit: Payer: Self-pay | Admitting: Physician Assistant

## 2021-10-28 ENCOUNTER — Other Ambulatory Visit: Payer: Self-pay | Admitting: Hematology

## 2021-11-06 ENCOUNTER — Other Ambulatory Visit: Payer: Self-pay | Admitting: Hematology

## 2021-11-06 DIAGNOSIS — C50412 Malignant neoplasm of upper-outer quadrant of left female breast: Secondary | ICD-10-CM

## 2021-11-06 DIAGNOSIS — Z170 Estrogen receptor positive status [ER+]: Secondary | ICD-10-CM

## 2021-11-06 MED ORDER — ALPRAZOLAM 0.5 MG PO TABS: 0.5000 mg | ORAL_TABLET | Freq: Every evening | ORAL | 0 refills | Status: DC | PRN

## 2021-11-20 ENCOUNTER — Telehealth: Payer: Self-pay | Admitting: Hematology

## 2021-11-20 NOTE — Telephone Encounter (Signed)
Sch per 2/1 inbasket,pt aware °

## 2021-12-12 ENCOUNTER — Other Ambulatory Visit: Payer: Medicaid Other

## 2021-12-12 ENCOUNTER — Ambulatory Visit: Payer: Medicaid Other | Admitting: Hematology

## 2021-12-19 ENCOUNTER — Other Ambulatory Visit: Payer: Self-pay | Admitting: Hematology

## 2021-12-19 DIAGNOSIS — C50412 Malignant neoplasm of upper-outer quadrant of left female breast: Secondary | ICD-10-CM

## 2021-12-19 MED ORDER — ALPRAZOLAM 0.5 MG PO TABS: 0.5000 mg | ORAL_TABLET | Freq: Every evening | ORAL | 0 refills | Status: DC | PRN

## 2021-12-26 ENCOUNTER — Other Ambulatory Visit: Payer: Self-pay | Admitting: Physician Assistant

## 2021-12-26 DIAGNOSIS — Z1231 Encounter for screening mammogram for malignant neoplasm of breast: Secondary | ICD-10-CM

## 2021-12-29 ENCOUNTER — Inpatient Hospital Stay: Payer: Medicaid Other | Attending: Hematology

## 2021-12-29 ENCOUNTER — Encounter: Payer: Self-pay | Admitting: Hematology

## 2021-12-29 ENCOUNTER — Other Ambulatory Visit: Payer: Self-pay

## 2021-12-29 ENCOUNTER — Inpatient Hospital Stay (HOSPITAL_BASED_OUTPATIENT_CLINIC_OR_DEPARTMENT_OTHER): Payer: Medicaid Other | Admitting: Hematology

## 2021-12-29 VITALS — BP 147/81 | HR 151 | Temp 98.6°F | Resp 18 | Ht 68.5 in | Wt 177.5 lb

## 2021-12-29 DIAGNOSIS — Z170 Estrogen receptor positive status [ER+]: Secondary | ICD-10-CM

## 2021-12-29 DIAGNOSIS — C50412 Malignant neoplasm of upper-outer quadrant of left female breast: Secondary | ICD-10-CM | POA: Diagnosis not present

## 2021-12-29 DIAGNOSIS — Z79810 Long term (current) use of selective estrogen receptor modulators (SERMs): Secondary | ICD-10-CM | POA: Insufficient documentation

## 2021-12-29 LAB — CBC WITH DIFFERENTIAL (CANCER CENTER ONLY)
Abs Immature Granulocytes: 0.01 10*3/uL (ref 0.00–0.07)
Basophils Absolute: 0 10*3/uL (ref 0.0–0.1)
Basophils Relative: 1 %
Eosinophils Absolute: 0 10*3/uL (ref 0.0–0.5)
Eosinophils Relative: 1 %
HCT: 40.4 % (ref 36.0–46.0)
Hemoglobin: 13.7 g/dL (ref 12.0–15.0)
Immature Granulocytes: 0 %
Lymphocytes Relative: 53 %
Lymphs Abs: 1.7 10*3/uL (ref 0.7–4.0)
MCH: 29.5 pg (ref 26.0–34.0)
MCHC: 33.9 g/dL (ref 30.0–36.0)
MCV: 86.9 fL (ref 80.0–100.0)
Monocytes Absolute: 0.4 10*3/uL (ref 0.1–1.0)
Monocytes Relative: 12 %
Neutro Abs: 1.1 10*3/uL — ABNORMAL LOW (ref 1.7–7.7)
Neutrophils Relative %: 33 %
Platelet Count: 252 10*3/uL (ref 150–400)
RBC: 4.65 MIL/uL (ref 3.87–5.11)
RDW: 14.3 % (ref 11.5–15.5)
WBC Count: 3.3 10*3/uL — ABNORMAL LOW (ref 4.0–10.5)
nRBC: 0 % (ref 0.0–0.2)

## 2021-12-29 LAB — CMP (CANCER CENTER ONLY)
ALT: 9 U/L (ref 0–44)
AST: 13 U/L — ABNORMAL LOW (ref 15–41)
Albumin: 4.3 g/dL (ref 3.5–5.0)
Alkaline Phosphatase: 44 U/L (ref 38–126)
Anion gap: 9 (ref 5–15)
BUN: 13 mg/dL (ref 6–20)
CO2: 29 mmol/L (ref 22–32)
Calcium: 9.4 mg/dL (ref 8.9–10.3)
Chloride: 101 mmol/L (ref 98–111)
Creatinine: 0.93 mg/dL (ref 0.44–1.00)
GFR, Estimated: >60 mL/min (ref >60)
Glucose, Bld: 92 mg/dL (ref 70–99)
Potassium: 3.6 mmol/L (ref 3.5–5.1)
Sodium: 139 mmol/L (ref 135–145)
Total Bilirubin: 0.6 mg/dL (ref 0.3–1.2)
Total Protein: 7.2 g/dL (ref 6.5–8.1)

## 2021-12-29 MED ORDER — ZOLPIDEM TARTRATE 5 MG PO TABS: 2.5000 mg | ORAL_TABLET | Freq: Every evening | ORAL | 0 refills | Status: DC | PRN

## 2021-12-29 MED ORDER — TAMOXIFEN CITRATE 10 MG PO TABS: 10.0000 mg | ORAL_TABLET | Freq: Every day | ORAL | 5 refills | Status: DC

## 2021-12-29 NOTE — Progress Notes (Signed)
[place]   [phone] [phone]   Clinic Follow up Note   Patient Care Team: [name], [place] as PCP - General (Physician Assistant) Excell Seltzer, MD (Inactive) as Consulting Physician (General Surgery) Truitt Merle, MD as Consulting Physician (Hematology) Gery Pray, MD as Consulting Physician (Radiation Oncology) Alla Feeling, NP as Nurse Practitioner (Nurse Practitioner)  Date of Service:  12/29/2021  CHIEF COMPLAINT: f/u of left breast DCIS  CURRENT THERAPY:  Adjuvant Tamoxifen, started in early Oct 2019  ASSESSMENT & PLAN:  Casey Baker is a 51 y.o. female with   1.  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 Treated with left total mastectomy and breast reconstruction and radiation. Currently on adjuvant Tamoxifen. -most recent right mammogram on 12/19/20 was negative. Next scheduled for tomorrow, 12/30/21. -I again discussed her switching to AI for an additional 5 years when she becomes post-menopausal. She would like to continue Tamoxifen. Because of her insomnia, we will try cutting this down to 10 mg. We will also check her hormone levels to ensure she is postmenopausal. -She is clinically doing well. Lab reviewed, her CBC and CMP are within normal limits. Her physical exam was unremarkable. There is no clinical concern for recurrence. -Continue surveillance.  -Continue Tamoxifen, due to her significant fatigue, will decrease dose to 10 mg daily. We will check hormone levels at next visit to see if she is truly postmenopausal and if we can switch to AI. -f/u in 6 months     2. Hot Flashes, depression and anxiety, insomnia -These symptoms were noted before starting Tamoxifen. -With work, cancer diagnosis and Tamoxifen her anxiety and depression has increased.  -She has tried gabapentin,  Melatonin, Ambien, ativan but without much relief. She tried to Effexor but stopped due to N&V. She tried Celexa 78m but stopped after 1 month due to drowsiness. She is currently on gabapentin. [place] to use several times a week. -She is currently on Xanax 0.543m2 tabs nightly. Anxiety and depression improved. She again declines need for a counselor. She is interested in coming off the Xanax; I discussed how to wean off the medicine.   3. HTN, poorly controlled, Elevated heart rate -She is currently on Lisinopril -BP improved with her improved depression, anxiety and stress.      Plan  -Continue Tamoxifen, will reduce dose to 10 mg daily due to her significant fatigue -I called in amAzerbaijanshe will wean off Xanax -right mammogram tomorrow, 3/14 -[facility]   No problem-specific Assessment & Plan notes found for this encounter.   SUMMARY OF ONCOLOGIC HISTORY: Oncology History Overview Note  Cancer Staging Ductal carcinoma in situ (DCIS) of left breast Staging form: Breast, AJCC 8th Edition - Clinical stage from 02/09/2018: Stage 0 (cTis (DCIS), cN0, cM0, ER+, PR+) - Unsigned - Pathologic stage from 03/18/2018: Stage IA (pT1m68mpN1a, cM0, G1, ER+, PR+, HER2-) - Signed by FenTruitt MerleD on 03/28/2018     Malignant ne[place] of upper-outer quadrant of left female breast ([place]amm[place]   IMPRESSION: Suspicious bloody left nipple discharge with palpable masses in the 2 o'clock axis of the left breast. Findings are suspicious for Malignancy. ADDENDUM: Magnification views of the retroareolar right breast were performed to evaluate calcifications seen on the patient's baseline mammogram. There are  loosely grouped and scattered calcifications in the outer right breast, superior to and directly posterior to the nipple. These calcifications are rounded and smudgy in the CC projection, and many of them demonstrate  layering on the 90 degree lateral magnification view, consistent with benign milk of calcium. No suspicious microcalcifications are seen in the right breast.   01/31/2018 Initial Biopsy   Diagnosis 01/31/18 1. 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 - NEGATIVE FOR INVASIVE CARCINOMA. 2. 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 - NEGATIVE FOR INVASIVE CARCINOMA.   01/31/2018 Receptors her2   Prognostic indicators significant for: ER, 100% positive and PR, 100% positive, both with strong staining intensity.    02/03/2018 Initial Diagnosis   Ductal carcinoma in situ (DCIS) of left breast   02/10/2018 Genetic Testing   Testing did not reveal a pathogenic mutation in any of the genes analyzed. A copy of the genetic test report will be scanned into Epic under the Media tab.  The genes analyzed were the 23 genes on [facility]'s Breast/GYN panel (ATM, BARD1, BRCA1, BRCA2, BRIP1, CDH1, CHEK2, DICER1, EPCAM, MLH1,  MSH2, MSH6, NBN, NF1, PALB2, PMS2, PTEN, RAD50, RAD51C, RAD51D,SMARCA4, STK11, and TP53).  Genetic testing involved analysis of 9 genes: ATM, BRCA1, BRCA2, CDH1, CHEK2, PALB2, PTEN, STK11 and TP53 genes. Testing was normal and did not reveal a mutation in these genes.     03/18/2018 Surgery   TOTAL LEFT MASTECTOMY WITH AXILLARY SENTINEL LYMPH NODE BIOPSY and  LEFT BREAST RECONSTRUCTION WITH PLACEMENT OF TISSUE EXPANDER AND FLEX HD (ACELLULAR HYDRATED DERMIS) by Dr. Excell Seltzer and Dr. Iran Planas  03/18/18   03/18/2018 Pathology Results   Diagnosis 03/18/18 1. Lymph node, sentinel, biopsy, Left Axillary #1 - METASTATIC CARCINOMA IN ONE OF ONE LYMPH NODES (1/1). 2. Lymph node, sentinel, biopsy, Left Axillary #2 - ONE OF ONE LYMPH NODES NEGATIVE FOR CARCINOMA (0/1). 3. Breast, simple mastectomy,  Left Total - MICROINVASIVE DUCTAL CARCINOMA ARISING IN A BACKGROUND OF LOW GRADE DUCTAL CARCINOMA IN SITU. - DUCTAL CARCINOMA IN SITU PARTIALLY INVOLVES AN INTRADUCTAL PAPILLOMA. - RESECTION MARGINS ARE NEGATIVE FOR INVASIVE CARCINOMA. - IN SITU CARCINOMA IS PRESENT AT THE ANTERIOR MARGIN BROADLY. - BIOPSY SITE. - SEE ONCOLOGY TABLE.    03/18/2018 Receptors her2   Estrogen Receptor: 100%, POSITIVE, MODERATE-WEAK STAINING INTENSITY Progesterone Receptor: 100%, POSITIVE, STRONG STAINING INTENSITY Proliferation Marker Ki67: 1% HER2 - NEGATIVE   03/18/2018 Cancer Staging   Staging form: Breast, AJCC 8th Edition - Pathologic stage from 03/18/2018: Stage IA (pT81m, pN1a, cM0, G1, ER+, PR+, HER2-) - Signed by [name] on 03/28/2018    05/18/2018 - 06/27/2018 Radiation Therapy   Adjuvant radiation by Dr. KSondra Come   06/27/2018 Mammogram   06/27/2018 Mammogram IMPRESSION: Benign appearing right breast calcifications.   Recommendation: Screening right mammogram is suggested in April 2020.   07/19/2018 -  Anti-estrogen oral therapy   Adjuvant tamoxifen     Survivorship   Per LCira Rue NP    03/07/2019 Surgery   REMOVAL OF LEFT TISSUE EXPANDER AND PLACEMENT OF SILICONE IMPLANT and LIPOFILLING FROM ABDOMEN TO LEFT CHEST and RIGHT BREAST MASTOPEXY by Dr. [name] 03/07/19   06/14/2020 Surgery   REVISION RIGHT BREAST REDUCTION and LIPOSUCTION BILATERAL CHEST WALL AND ABDOMEN; LIPOFILLING LEFT CHEST by Dr [name]      INTERVAL HISTORY:  Casey Baker is here for a follow up of breast cancer. She was last seen by me on 06/13/21. She presents to the clinic alone. She reports she is doing well overall. She denies any new breast concerns. She does report side effects from the tamoxifen, including worsening insomnia. She notes that insomnia runs in her family anyway, so she feels the tamoxifen is exacerbating this.   All other systems were reviewed with the patient and are  negative.  MEDICAL HISTORY:  Past Medical History:  Diagnosis Date   Anemia    required iron transfusions while having periods   Anxiety    Breast cancer ([place]) 2019   left breast   Ductal carcinoma in situ (DCIS) of left breast 02/03/2018   Family history of breast cancer    Family history of ovarian cancer    Genetic testing 02/21/2018   STAT Breast panel with reflex to Breast/GYN panel (23 genes) @ [facility] - No pathogenic mutations detected   Hypertension    PONV (postoperative nausea and vomiting)    Uterine fibroid    Vaginal delivery 1992, 1994, 1997    SURGICAL HISTORY: Past Surgical History:  Procedure Laterality Date   AXILLARY SURGERY     BREAST RECONSTRUCTION WITH PLACEMENT OF TISSUE EXPANDER AND FLEX HD (ACELLULAR HYDRATED DERMIS) Left 03/18/2018   Procedure: LEFT BREAST RECONSTRUCTION WITH PLACEMENT OF TISSUE EXPANDER AND FLEX HD (ACELLULAR HYDRATED DERMIS);  Surgeon: [name], MD;  Location: [facility];  Service: Plastics;  Laterality: Left;   BREAST REDUCTION SURGERY Right 06/14/2020   Procedure: REVISION RIGHT BREAST REDUCTION;  Surgeon: [name], MD;  Location: [facility];  Service: Plastics;  Laterality: Right;   DENTAL SURGERY N/A 07/08/2018   KNEE ARTHROSCOPY Left 2013   LIPOSUCTION WITH LIPOFILLING Left 03/07/2019   Procedure: LIPOFILLING FROM ABDOMEN TO LEFT CHEST;  Surgeon: [name], MD;  Location: [facility];  Service: Plastics;  Laterality: Left;   LIPOSUCTION WITH LIPOFILLING Bilateral 06/14/2020   Procedure: LIPOSUCTION BILATERAL CHEST WALL AND ABDOMEN; LIPOFILLING LEFT CHEST;  Surgeon: [name], MD;  Location: [facility];  Service: Plastics;  Laterality: Bilateral;   MASTECTOMY Left 2019/5   MASTECTOMY W/ SENTINEL NODE BIOPSY Left 03/18/2018   Procedure: TOTAL LEFT MASTECTOMY WITH AXILLARY SENTINEL LYMPH NODE BIOPSY;  Surgeon: Excell Seltzer, MD;  Location:  [facility];  Service: General;  Laterality: Left;   MASTOPEXY Right 03/07/2019   Procedure: RIGHT BREAST MASTOPEXY;  Surgeon: [name], MD;  Location: [facility];  Service: Plastics;  Laterality: Right;   REDUCTION MAMMAPLASTY Right 02/2019   REMOVAL OF TISSUE EXPANDER AND PLACEMENT OF IMPLANT Left 03/07/2019   Procedure: REMOVAL OF LEFT TISSUE EXPANDER AND PLACEMENT OF SILICONE IMPLANT;  Surgeon: [name], MD;  Location: [facility];  Service: Plastics;  Laterality: Left;   TOOTH EXTRACTION N/A 07/08/2018   Procedure: DENTAL RESTORATION/EXTRACTIONS;  Surgeon: Diona Browner, DDS;  Location: [facility];  Service: Oral Surgery;  Laterality: N/A;   TUBAL LIGATION      I have reviewed the social history and family history with the patient and they are unchanged from previous note.  ALLERGIES:  has No Known Allergies.  MEDICATIONS:  Current Outpatient Medications  Medication Sig Dispense Refill   tamoxifen (NOLVADEX) 10 MG tablet Take 1 tablet (10 mg total) by mouth daily. 30 tablet 5   zolpidem (AMBIEN) 5 MG tablet Take 0.5-1 tablets (2.5-5 mg total) by mouth  at bedtime as needed for sleep. 20 tablet 0   ALPRAZolam (XANAX) 0.5 MG tablet Take 1-2 tablets (0.5-1 mg total) by mouth at bedtime as needed for anxiety or sleep. 60 tablet 0   Blood Glucose Monitoring Suppl (TRUE METRIX METER) w/Device KIT USE TO CHECK BLOOD SUGAR DAILY AND PRN 1 kit 0   cephALEXin (KEFLEX) 500 MG capsule Take 1 capsule (500 mg total) by mouth 4 (four) times daily. 20 capsule 0   gabapentin (NEURONTIN) 300 MG capsule TAKE 1 CAPSULE(300 MG) BY MOUTH TWICE DAILY 60 capsule 2   glucose blood test strip USE TO CHECK BLOOD SUGAR DAILY AND PRN 100 each 12   hydrOXYzine (ATARAX/VISTARIL) 25 MG tablet Take 1 tablet (25 mg total) by mouth every 6 (six) hours as needed. 16 tablet 0   ibuprofen (ADVIL) 800 MG tablet Take 800 mg by mouth 3 (three) times daily. (Patient not  taking: Reported on 07/10/2021)     Lancets 33G MISC USE TO CHECK BLOOD SUGAR DAILY AND PRN 100 each 4   lisinopril-hydrochlorothiazide (ZESTORETIC) 20-25 MG tablet TAKE 1 TABLET BY MOUTH DAILY 90 tablet 0   propranolol (INDERAL) 20 MG tablet Take 1 tablet (20 mg total) by mouth 3 (three) times daily. 90 tablet 3   No current facility-administered medications for this visit.    PHYSICAL EXAMINATION: ECOG PERFORMANCE STATUS: 1 - Symptomatic but completely ambulatory  Vitals:   12/29/21 0837  BP: (!) 147/81  Pulse: (!) 151  Resp: 18  Temp: 98.6 F (37 C)  SpO2: 100%   Wt Readings from Last 3 Encounters:  12/29/21 177 lb 8 oz (80.5 kg)  06/13/21 179 lb (81.2 kg)  12/17/20 191 lb (86.6 kg)     GENERAL:alert, no distress and comfortable SKIN: skin color, texture, turgor are normal, no rashes or significant lesions EYES: normal, Conjunctiva are pink and non-injected, sclera clear  NECK: supple, thyroid normal size, non-tender, without nodularity LYMPH:  no palpable lymphadenopathy in the cervical, axillary  LUNGS: clear to auscultation and percussion with normal breathing effort HEART: regular rate & rhythm and no murmurs and no lower extremity edema ABDOMEN:abdomen soft, non-tender and normal bowel sounds Musculoskeletal:no cyanosis of digits and no clubbing  NEURO: alert & oriented x 3 with fluent speech, no focal motor/sensory deficits BREAST: No palpable mass, nodules or adenopathy bilaterally. Breast exam benign.   LABORATORY DATA:  I have reviewed the data as listed CBC Latest Ref Rng & Units 12/29/2021 06/13/2021 12/16/2020  WBC 4.0 - 10.5 K/uL 3.3(L) 3.5(L) 4.4  Hemoglobin 12.0 - 15.0 g/dL 13.7 14.0 13.5  Hematocrit 36.0 - 46.0 % 40.4 40.6 40.1  Platelets 150 - 400 K/uL 252 301 282     CMP Latest Ref Rng & Units 12/29/2021 06/13/2021 12/16/2020  Glucose 70 - 99 mg/dL 92 108(H) 108(H)  BUN 6 - 20 mg/dL '13 14 11  ' Creatinine 0.44 - 1.00 mg/dL 0.93 0.98 0.99  Sodium 135 -  145 mmol/L 139 137 136  Potassium 3.5 - 5.1 mmol/L 3.6 4.3 4.0  Chloride 98 - 111 mmol/L 101 101 102  CO2 22 - 32 mmol/L '29 26 26  ' Calcium 8.9 - 10.3 mg/dL 9.4 9.9 9.4  Total Protein 6.5 - 8.1 g/dL 7.2 7.9 7.7  Total Bilirubin 0.3 - 1.2 mg/dL 0.6 0.6 0.6  Alkaline Phos 38 - 126 U/L 44 49 63  AST 15 - 41 U/L 13(L) 14(L) 14(L)  ALT 0 - 44 U/L 9 13 11  RADIOGRAPHIC STUDIES: I have personally reviewed the radiological images as listed and agreed with the findings in the report. No results found.    Orders Placed This Encounter  Procedures   FSH-Follicle stimulating hormone    Standing Status:   Future    Standing Expiration Date:   12/29/2022   All questions were answered. The patient knows to call the clinic with any problems, questions or concerns. No barriers to learning was detected. The total time spent in the appointment was 30 minutes.     Truitt Merle, MD 12/29/2021   I, Wilburn Mylar, am acting as scribe for Truitt Merle, MD.   I have reviewed the above documentation for accuracy and completeness, and I agree with the above.

## 2021-12-30 ENCOUNTER — Ambulatory Visit
Admission: RE | Admit: 2021-12-30 | Discharge: 2021-12-30 | Disposition: A | Discharge status: Home / Self Care | Payer: Medicaid Other | Source: Ambulatory Visit | Attending: Physician Assistant | Admitting: Physician Assistant

## 2021-12-30 ENCOUNTER — Telehealth: Payer: Self-pay | Admitting: Hematology

## 2021-12-30 DIAGNOSIS — Z1231 Encounter for screening mammogram for malignant neoplasm of breast: Secondary | ICD-10-CM

## 2021-12-30 NOTE — Telephone Encounter (Signed)
Scheduled follow-up appointments per 3/13 los. Patient is aware. ?

## 2022-01-03 ENCOUNTER — Other Ambulatory Visit: Payer: Self-pay | Admitting: Hematology

## 2022-01-03 MED ORDER — QUVIVIQ 25 MG PO TABS
25.0000 mg | ORAL_TABLET | Freq: Every day | ORAL | 0 refills | Status: DC | PRN
Start: 2022-01-03 — End: 2022-01-19

## 2022-01-19 ENCOUNTER — Telehealth (INDEPENDENT_AMBULATORY_CARE_PROVIDER_SITE_OTHER): Payer: Medicaid Other | Admitting: Physician Assistant

## 2022-01-19 ENCOUNTER — Encounter: Payer: Self-pay | Admitting: Physician Assistant

## 2022-01-19 VITALS — Ht 68.5 in | Wt 177.0 lb

## 2022-01-19 DIAGNOSIS — I10 Essential (primary) hypertension: Secondary | ICD-10-CM

## 2022-01-19 DIAGNOSIS — G4700 Insomnia, unspecified: Secondary | ICD-10-CM

## 2022-01-19 DIAGNOSIS — F418 Other specified anxiety disorders: Secondary | ICD-10-CM | POA: Diagnosis not present

## 2022-01-19 MED ORDER — LISINOPRIL 40 MG PO TABS: 40.0000 mg | ORAL_TABLET | Freq: Every day | ORAL | 1 refills | Status: DC

## 2022-01-19 MED ORDER — ZOLPIDEM TARTRATE 10 MG PO TABS
10.0000 mg | ORAL_TABLET | Freq: Every evening | ORAL | 2 refills | Status: DC | PRN
Start: 2022-01-19 — End: 2022-07-03

## 2022-01-19 MED ORDER — BLOOD PRESSURE MONITOR AUTOMAT DEVI: 0 refills | Status: AC

## 2022-01-19 NOTE — Progress Notes (Addendum)
? ?Virtual Visit via Video Note  ? ?IInda Coke, connected with  Casey Baker  ([id number], Sep 12, 1971) on 01/19/22 at  2:00 PM EDT by a video-enabled telemedicine application and verified that I am speaking with the correct person using two identifiers. ? ?Location: ?Patient: Home ?Provider: [facility] office ?  ?I discussed the limitations of evaluation and management by telemedicine and the availability of in person appointments. The patient expressed understanding and agreed to proceed.   ? ?History of Present Illness: ?Casey Baker is a 51 y.o. who identifies as a female who was assigned female at birth, and is being seen today for multiple medical issues. ? ?Insomnia ?She has been treated by her oncologist, Dr. Burr Medico, for this with ambien 5 mg. This worked well for her and she would like refill but her oncologist reports that this needs to come from our office. She would like to trial a higher dosage. Denies any disruptive or complex sleep behaviors while on this med. ? ? ?HTN ?Currently taking lisinopril-hctz 20-25 mg. At home blood pressure readings are: not regularly checked. Patient denies chest pain, SOB, blurred vision, dizziness, unusual headaches, lower leg swelling. Patient is compliant with medication. Denies excessive caffeine intake, stimulant usage, excessive alcohol intake. She fills as though this makes her crave salt and use the restroom all the time and she would like an alternative rx. ? ?BP Readings from Last 3 Encounters:  ?12/29/21 (!) 147/81  ?07/10/21 121/83  ?06/13/21 130/88  ? ? ?Anxiety and Depression ?Overall managing well. Her oncologist is weaning her off of her xanax currently. She feels overall happy. She is about to start working soon for [employer]. ? ?Problems:  ?Patient Active Problem List  ? Diagnosis Date Noted  ? Insulin resistance 12/17/2020  ? Breast cancer, left breast ([place]) 03/18/2018  ? Genetic testing 02/21/2018  ? Family history of  breast cancer   ? Family history of ovarian cancer   ? Iron deficiency anemia due to chronic blood loss 02/09/2018  ? Malignant neoplasm of upper-outer quadrant of left female breast ([place]) 02/03/2018  ? Breast wound, left, subsequent encounter 01/12/2018  ? Hypertension   ? Anxiety with depression   ? Uterine fibroid 09/29/2014  ? Menorrhagia 09/29/2014  ? Anemia 09/29/2014  ? Sinus tachycardia   ?  ?Allergies: No Known Allergies ?Medications:  ?Current Outpatient Medications:  ?  ALPRAZolam (XANAX) 0.5 MG tablet, Take 1-2 tablets (0.5-1 mg total) by mouth at bedtime as needed for anxiety or sleep., Disp: 60 tablet, Rfl: 0 ?  Blood Pressure Monitoring (BLOOD PRESSURE MONITOR AUTOMAT) DEVI, TO CHECK BLOOD PRESSURE TWICE DAILY AS DIRECTED, Disp: 1 each, Rfl: 0 ?  lisinopril (ZESTRIL) 40 MG tablet, Take 1 tablet (40 mg total) by mouth daily., Disp: 90 tablet, Rfl: 1 ?  propranolol (INDERAL) 20 MG tablet, Take 1 tablet (20 mg total) by mouth 3 (three) times daily., Disp: 90 tablet, Rfl: 3 ?  tamoxifen (NOLVADEX) 10 MG tablet, Take 1 tablet (10 mg total) by mouth daily., Disp: 30 tablet, Rfl: 5 ?  zolpidem (AMBIEN) 10 MG tablet, Take 1 tablet (10 mg total) by mouth at bedtime as needed for sleep., Disp: 30 tablet, Rfl: 2 ?  Lancets 33G MISC, USE TO CHECK BLOOD SUGAR DAILY AND PRN, Disp: 100 each, Rfl: 4 ? ?Observations/Objective: ?Patient is well-developed, well-nourished in no acute distress.  ?Resting comfortably  at home.  ?Head is normocephalic, atraumatic.  ?No labored breathing.  ?Speech is  clear and coherent with logical content.  ?Patient is alert and oriented at baseline.  ? ?Assessment and Plan: ?1. Anxiety with depression ?Improving ?Mgmt per oncology ?Continue to support ?Follow-up with [place] in 3 months, sooner if concerns ?I discussed with patient that if they develop any SI, to tell someone immediately and seek medical attention. ? ?2. Primary hypertension ?Unclear if controlled; asymptomatic ?I have sent in  BP monitor for her so she can start checking ?Stop combo lisinopril/hctz ?Start lisinipril 40 mg daily ?Follow-up in 3 months, sooner if concerns ?If needs diuretic, consider prn lasix ? ?3. Insomnia, unspecified type ?Uncontrolled ?Reviewed side effects and risks of ambien and she is willing to proceed ?Start ambien 10 mg -- recommend cutting in half to see if this is sufficient ?Follow-up in 3 months, sooner if concerns ? ?Follow Up Instructions: ?I discussed the assessment and treatment plan with the patient. The patient was provided an opportunity to ask questions and all were answered. The patient agreed with the plan and demonstrated an understanding of the instructions.  A copy of instructions were sent to the patient via MyChart unless otherwise noted below.  ? ?The patient was advised to call back or seek an in-person evaluation if the symptoms worsen or if the condition fails to improve as anticipated. ? ?[name], PA ?

## 2022-02-24 ENCOUNTER — Encounter: Payer: Self-pay | Admitting: Physician Assistant

## 2022-02-25 ENCOUNTER — Ambulatory Visit: Payer: Medicaid Other | Admitting: Family

## 2022-03-29 ENCOUNTER — Other Ambulatory Visit: Payer: Self-pay | Admitting: Hematology

## 2022-03-29 DIAGNOSIS — C50412 Malignant neoplasm of upper-outer quadrant of left female breast: Secondary | ICD-10-CM

## 2022-03-31 ENCOUNTER — Encounter: Payer: Self-pay | Admitting: Physician Assistant

## 2022-03-31 ENCOUNTER — Telehealth (INDEPENDENT_AMBULATORY_CARE_PROVIDER_SITE_OTHER): Payer: Medicaid Other | Admitting: Physician Assistant

## 2022-03-31 ENCOUNTER — Telehealth: Payer: Self-pay | Admitting: Physician Assistant

## 2022-03-31 VITALS — Temp 97.0°F | Ht 68.5 in | Wt 180.0 lb

## 2022-03-31 DIAGNOSIS — G4700 Insomnia, unspecified: Secondary | ICD-10-CM

## 2022-03-31 DIAGNOSIS — B009 Herpesviral infection, unspecified: Secondary | ICD-10-CM

## 2022-03-31 MED ORDER — VALACYCLOVIR HCL 1 G PO TABS: ORAL_TABLET | ORAL | 1 refills | Status: AC

## 2022-03-31 NOTE — Telephone Encounter (Signed)
FYI, pt being seen today, virtual visit at 10:20.

## 2022-03-31 NOTE — Telephone Encounter (Signed)
Patient seeing sam- VV at 1020am    Patient Name: Casey Baker Gender: Female DOB: 1970-10-29 Age: 51 Y 15 D Return Phone Number: [phone] (Primary) Address: City/ State/ Zip: [place] [address] Client [facility] [place] Client Site [facility] [employer] [name]- [place] Contact Type Call Who Is Calling Patient / Member / Family / Caregiver Call Type Triage / Clinical Relationship To Patient Self Return Phone Number [phone] (Primary) Chief Complaint Mouth Symptoms Reason for Call Symptomatic / Request for [place] states that she has had a fever blister coming up on her lip. She would like to know if she has to be seen for this issue or if this is treatable without being seen by the PA. Translation No Nurse Assessment Nurse: [place], RN, Estill Bamberg Date/Time (Eastern Time): 03/31/2022 8:02:22 AM Confirm and document reason for call. If symptomatic, describe symptoms. ---caller states she has a fever blister that started a week ago and then went away and came back. she has been stressed lately. Does the patient have any new or worsening symptoms? ---Yes Will a triage be completed? ---Yes Related visit to physician within the last 2 weeks? ---N/A Does the PT have any chronic conditions? (i.e. diabetes, asthma, this includes High risk factors for pregnancy, etc.) ---Yes List chronic conditions. ---hx breast CA Is the patient pregnant or possibly pregnant? (Ask all females between the ages of 49-55) ---No Is this a behavioral health or substance abuse call? ---No Guidelines Guideline Title Affirmed Question Affirmed Notes Nurse Date/Time (Eastern Time) Cold Sores (Fever Blisters) [1] Red streak or red area spreading from cold sore AND [2] no fever Humfleet, RN, Estill Bamberg 03/31/2022 8:04:15 AM PLEASE NOTE: All timestamps contained within this  report are represented as [place] Standard Time. CONFIDENTIALTY NOTICE: This fax transmission is intended only for the addressee. It contains information that is legally privileged, confidential or otherwise protected from use or disclosure. If you are not the intended recipient, you are strictly prohibited from reviewing, disclosing, copying using or disseminating any of this information or taking any action in reliance on or regarding this information. If you have received this fax in error, please notify us immediately by telephone so that we can arrange for its return to [place]. Phone: [phone], Toll-Free: [phone], Fax: [phone] Page: 2 of 2 Call Id: [id number] [place]. Time Eilene Ghazi Time) Disposition Final User 03/31/2022 8:06:40 AM See PCP within 24 Hours Yes [place], RN, Shelly Coss Disagree/Comply Comply Caller Understands Yes PreDisposition Did not know what to do Care Advice Given Per Guideline SEE PCP WITHIN 24 HOURS: * IF OFFICE WILL BE OPEN: You need to be examined within the next 24 hours. Call your doctor (or NP/PA) when the office opens and make an appointment. CARE ADVICE given per Fever Blisters of Lip (Cold Sores) (Adult) guideline. CALL BACK IF: * You become worse * Fever occurs Referrals REFERRED TO PCP OFFICE

## 2022-03-31 NOTE — Progress Notes (Signed)
Virtual Visit via Video Note   I, [name], connected with  Casey Baker  ([id number], 1971-04-27) on 03/31/22 at 10:20 AM EDT by a video-enabled telemedicine application and verified that I am speaking with the correct person using two identifiers.  Location: Patient: Home Provider: [place] office   I discussed the limitations of evaluation and management by telemedicine and the availability of in person appointments. The patient expressed understanding and agreed to proceed.    History of Present Illness: Casey Baker is a 51 y.o. who identifies as a female who was assigned female at birth, and is being seen today for cold sores and insomnia.   Cold sores Hx of cold sores. Has not had a flare in several years. She is very stressed with work and she is about to move. Denies fevers, chills, sore throat, recent URI. She has not tried anything for her symptoms.   Insomnia Currently prescribed Ambien 10 mg daily. She has been tolerating this well. She noticed that she feels like she is starting to sleep "too much" with this. She also noticed that she may have hallucinated with this. Denies sleep walking or falls.    Problems:  Patient Active Problem List   Diagnosis Date Noted   Insulin resistance 12/17/2020   Breast cancer, left breast ([place]) 03/18/2018   Genetic testing 02/21/2018   Family history of breast cancer    Family history of ovarian cancer    Iron deficiency anemia due to chronic blood loss 02/09/2018   Malignant neoplasm of upper-outer quadrant of left female breast ([place]) 02/03/2018   Breast wound, left, subsequent encounter 01/12/2018   Hypertension    Anxiety with depression    Uterine fibroid 09/29/2014   Menorrhagia 09/29/2014   Anemia 09/29/2014   Sinus tachycardia     Allergies: No Known Allergies Medications:  Current Outpatient Medications:    ALPRAZolam (XANAX) 0.5 MG tablet, Take 1-2 tablets (0.5-1 mg total) by mouth at  bedtime as needed for anxiety or sleep., Disp: 60 tablet, Rfl: 0   Blood Pressure Monitoring (BLOOD PRESSURE MONITOR AUTOMAT) DEVI, TO CHECK BLOOD PRESSURE TWICE DAILY AS DIRECTED, Disp: 1 each, Rfl: 0   Lancets 33G MISC, USE TO CHECK BLOOD SUGAR DAILY AND PRN, Disp: 100 each, Rfl: 4   lisinopril (ZESTRIL) 40 MG tablet, Take 1 tablet (40 mg total) by mouth daily., Disp: 90 tablet, Rfl: 1   propranolol (INDERAL) 20 MG tablet, Take 1 tablet (20 mg total) by mouth 3 (three) times daily., Disp: 90 tablet, Rfl: 3   tamoxifen (NOLVADEX) 10 MG tablet, Take 1 tablet (10 mg total) by mouth daily., Disp: 30 tablet, Rfl: 5   zolpidem (AMBIEN) 10 MG tablet, Take 1 tablet (10 mg total) by mouth at bedtime as needed for sleep., Disp: 30 tablet, Rfl: 2  Observations/Objective: Patient is well-developed, well-nourished in no acute distress.  Resting comfortably  at home.  Head is normocephalic, atraumatic.  No labored breathing.  Speech is clear and coherent with logical content.  Patient is alert and oriented at baseline.  Small erythematous lesion to upper R lip   Assessment and Plan: 1. 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 Insomnia, unspecified type Recommend reduction of ambien to 5 mg daily If unwanted side effects persist, I recommend follow-up to discuss alternative options  Follow Up Instructions: I discussed the assessment and treatment plan with the  patient. The patient was provided an opportunity to ask questions and all were answered. The patient agreed with the plan and demonstrated an understanding of the instructions.  A copy of instructions were sent to the patient via MyChart unless otherwise noted below.   The patient was advised to call back or seek an in-person evaluation if the symptoms worsen or if the condition fails to improve as anticipated.  [name], [place]

## 2022-04-01 ENCOUNTER — Telehealth: Payer: Self-pay

## 2022-04-01 ENCOUNTER — Other Ambulatory Visit: Payer: Self-pay | Admitting: Hematology

## 2022-04-01 ENCOUNTER — Other Ambulatory Visit: Payer: Self-pay

## 2022-04-01 DIAGNOSIS — Z170 Estrogen receptor positive status [ER+]: Secondary | ICD-10-CM

## 2022-04-01 MED ORDER — ALPRAZOLAM 0.5 MG PO TABS: 0.5000 mg | ORAL_TABLET | Freq: Every evening | ORAL | 0 refills | Status: DC | PRN

## 2022-04-01 NOTE — Telephone Encounter (Signed)
Spoke with pt via telephone regarding her Alprazolam.  Pt state she's taking a 1/2 pill as needed but just really starting weaning herself off of the Alprazolam at the beginning of June 2023.  Informed pt that the goal is for her to be completely off of the Alprazolam by the end of the month if possible.  Informed pt that Dr. Burr Medico has authorized a 15 pills to be refilled in hopes that the pt will be completely weaned off of the Alprazolam by the end of June 2023.  Pt verbalized understanding.  Informed pt that most likely Dr. Ernestina Penna office will call her again should receive another refill request at the end of the month to see how she's doing with weaning off of the Alprazolam.  Pt verbalized understanding.

## 2022-04-30 ENCOUNTER — Telehealth: Payer: Self-pay | Admitting: Hematology

## 2022-04-30 NOTE — Telephone Encounter (Signed)
Rescheduled upcoming appointment due to provider's template. Patient is aware of changes. 

## 2022-06-26 ENCOUNTER — Other Ambulatory Visit: Payer: Self-pay

## 2022-06-26 ENCOUNTER — Inpatient Hospital Stay: Payer: Medicaid Other | Attending: Hematology

## 2022-06-26 ENCOUNTER — Encounter: Payer: Self-pay | Admitting: Hematology

## 2022-06-26 DIAGNOSIS — R5383 Other fatigue: Secondary | ICD-10-CM | POA: Insufficient documentation

## 2022-06-26 DIAGNOSIS — N951 Menopausal and female climacteric states: Secondary | ICD-10-CM | POA: Diagnosis not present

## 2022-06-26 DIAGNOSIS — D0512 Intraductal carcinoma in situ of left breast: Secondary | ICD-10-CM | POA: Diagnosis not present

## 2022-06-26 DIAGNOSIS — Z79899 Other long term (current) drug therapy: Secondary | ICD-10-CM | POA: Diagnosis not present

## 2022-06-26 DIAGNOSIS — Z170 Estrogen receptor positive status [ER+]: Secondary | ICD-10-CM

## 2022-06-26 LAB — CBC WITH DIFFERENTIAL (CANCER CENTER ONLY)
Abs Immature Granulocytes: 0.01 10*3/uL (ref 0.00–0.07)
Basophils Absolute: 0 10*3/uL (ref 0.0–0.1)
Basophils Relative: 1 %
Eosinophils Absolute: 0 10*3/uL (ref 0.0–0.5)
Eosinophils Relative: 1 %
HCT: 39.8 % (ref 36.0–46.0)
Hemoglobin: 13.6 g/dL (ref 12.0–15.0)
Immature Granulocytes: 0 %
Lymphocytes Relative: 43 %
Lymphs Abs: 1.8 10*3/uL (ref 0.7–4.0)
MCH: 29.5 pg (ref 26.0–34.0)
MCHC: 34.2 g/dL (ref 30.0–36.0)
MCV: 86.3 fL (ref 80.0–100.0)
Monocytes Absolute: 0.4 10*3/uL (ref 0.1–1.0)
Monocytes Relative: 10 %
Neutro Abs: 1.9 10*3/uL (ref 1.7–7.7)
Neutrophils Relative %: 45 %
Platelet Count: 230 10*3/uL (ref 150–400)
RBC: 4.61 MIL/uL (ref 3.87–5.11)
RDW: 14 % (ref 11.5–15.5)
WBC Count: 4.1 10*3/uL (ref 4.0–10.5)
nRBC: 0 % (ref 0.0–0.2)

## 2022-06-26 LAB — CMP (CANCER CENTER ONLY)
ALT: 7 U/L (ref 0–44)
AST: 12 U/L — ABNORMAL LOW (ref 15–41)
Albumin: 4.1 g/dL (ref 3.5–5.0)
Alkaline Phosphatase: 50 U/L (ref 38–126)
Anion gap: 7 (ref 5–15)
BUN: 13 mg/dL (ref 6–20)
CO2: 27 mmol/L (ref 22–32)
Calcium: 9.2 mg/dL (ref 8.9–10.3)
Chloride: 108 mmol/L (ref 98–111)
Creatinine: 0.89 mg/dL (ref 0.44–1.00)
GFR, Estimated: >60 mL/min (ref >60)
Glucose, Bld: 98 mg/dL (ref 70–99)
Potassium: 4 mmol/L (ref 3.5–5.1)
Sodium: 142 mmol/L (ref 135–145)
Total Bilirubin: 0.5 mg/dL (ref 0.3–1.2)
Total Protein: 7.1 g/dL (ref 6.5–8.1)

## 2022-06-28 LAB — FOLLICLE STIMULATING HORMONE: FSH: 10.1 m[IU]/mL

## 2022-07-03 ENCOUNTER — Encounter: Payer: Self-pay | Admitting: Hematology

## 2022-07-03 ENCOUNTER — Telehealth: Payer: Medicaid Other | Admitting: Hematology

## 2022-07-03 ENCOUNTER — Inpatient Hospital Stay (HOSPITAL_BASED_OUTPATIENT_CLINIC_OR_DEPARTMENT_OTHER): Payer: Medicaid Other | Admitting: Hematology

## 2022-07-03 DIAGNOSIS — C50412 Malignant neoplasm of upper-outer quadrant of left female breast: Secondary | ICD-10-CM

## 2022-07-03 DIAGNOSIS — Z170 Estrogen receptor positive status [ER+]: Secondary | ICD-10-CM

## 2022-07-03 MED ORDER — TAMOXIFEN CITRATE 10 MG PO TABS: 10.0000 mg | ORAL_TABLET | Freq: Every day | ORAL | 5 refills | Status: DC

## 2022-07-03 MED ORDER — ALPRAZOLAM 0.5 MG PO TABS: 0.5000 mg | ORAL_TABLET | Freq: Every evening | ORAL | 0 refills | Status: DC | PRN

## 2022-07-03 NOTE — Progress Notes (Signed)
[place]   [phone] [phone]) [phone]   Clinic Follow up Note   Patient Care Team: [name], [place] as PCP - General (Physician Assistant) Excell Seltzer, MD (Inactive) as Consulting Physician (General Surgery) Truitt Merle, MD as Consulting Physician (Hematology) Gery Pray, MD as Consulting Physician (Radiation Oncology) Alla Feeling, NP as Nurse Practitioner (Nurse Practitioner)  Date of Service:  07/03/2022  I connected with Casey Baker on 07/03/2022 at 11:00 AM EDT by telephone visit and verified that I am speaking with the correct person using two identifiers.  I discussed the limitations, risks, security and privacy concerns of performing an evaluation and management service by telephone and the availability of in person appointments. I also discussed with the patient that there may be a patient responsible charge related to this service. The patient expressed understanding and agreed to proceed.   Other persons participating in the visit and their role in the encounter:  none  Patient's location:  home Provider's location:  my office  CHIEF COMPLAINT: f/u of left breast DCIS  CURRENT THERAPY:  Tamoxifen, starting 07/2018, 26m since 12/29/21  ASSESSMENT & PLAN:  Casey CONSTANCIOis a 51y.o. peri-menopausal female with   1.  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 Treated with left total mastectomy and breast reconstruction and radiation. Currently on adjuvant Tamoxifen since 07/2018. -most recent right mammogram on 12/30/21 was negative.  -we decreased her tamoxifen dose to 175mat last visit on 12/29/21. She reports she is tolerating much better. [place] shows she is still pre-/peri-menopausal. Will continue tamoxifen 1035m-She is clinically doing well. Other lab from 9/8 reviewed, her CBC  and CMP are within normal limits. -Continue surveillance, next mammogram due 12/2022 -f/u in 6 months.    2. Hot Flashes, depression and anxiety, insomnia -present before starting Tamoxifen. -With work, cancer diagnosis and Tamoxifen her anxiety and depression has increased.  -She has tried gabapentin, Melatonin, Ambien, ativan without much relief and did not tolerate Effexor or Celexa.  -She is currently on Xanax 0.5mg37mN for sleep. She uses once every a few days. I refilled for her today.     Plan  -Continue Tamoxifen 10mg16mrefilled xanax and tamoxifen -mammogram due 12/2022 -lab and f/u in 6 months   No problem-specific Assessment & Plan notes found for this encounter.   SUMMARY OF ONCOLOGIC HISTORY: Oncology History Overview Note  Cancer Staging Ductal carcinoma in situ (DCIS) of left breast Staging form: Breast, AJCC 8th Edition - Clinical stage from 02/09/2018: Stage 0 (cTis (DCIS), cN0, cM0, ER+, PR+) - Unsigned - Pathologic stage from 03/18/2018: Stage IA (pT1mi, 30ma, cM0, G1, ER+, PR+, HER2-) - Signed by Kadden Osterhout, Truitt Merlen 03/28/2018     Malignant neoplasm of upper-outer quadrant of left female breast ([facility])  [place]1/2019 Mammogram   IMPRESSION: Suspicious bloody left nipple discharge with palpable masses in the 2 o'clock axis of the left breast. Findings are suspicious for Malignancy. ADDENDUM: Magnification views of the retroareolar right breast were performed to evaluate calcifications seen on the patient's baseline mammogram. There are loosely grouped and scattered calcifications in the outer right breast, superior to and directly posterior to the nipple. These calcifications are rounded and smudgy in the CC projection, and many of them demonstrate layering on the 90 degree lateral magnification view, consistent with benign milk of calcium. No suspicious  microcalcifications are seen in the right breast.   01/31/2018 Initial Biopsy   Diagnosis 01/31/18 1. Breast,  left, needle core biopsy, 2 o'clock, 6 cfn - LOW GRADE DUCTAL CARCINOMA IN SITU (DCIS) PARTIALLY INVOLVING AN INTRADUCTAL PAPILLOMA. - NEGATIVE FOR INVASIVE CARCINOMA. 2. 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 - NEGATIVE FOR INVASIVE CARCINOMA.   01/31/2018 Receptors her2   Prognostic indicators significant for: ER, 100% positive and PR, 100% positive, both with strong staining intensity.    02/03/2018 Initial Diagnosis   Ductal carcinoma in situ (DCIS) of left breast   02/10/2018 Genetic Testing   Testing did not reveal a pathogenic mutation in any of the genes analyzed. A copy of the genetic test report will be scanned into Epic under the Media tab.  The genes analyzed were the 23 genes on [facility]'s Breast/GYN panel (ATM, BARD1, BRCA1, BRCA2, BRIP1, CDH1, CHEK2, DICER1, EPCAM, MLH1,  MSH2, MSH6, NBN, NF1, PALB2, PMS2, PTEN, RAD50, RAD51C, RAD51D,SMARCA4, STK11, and TP53).  Genetic testing involved analysis of 9 genes: ATM, BRCA1, BRCA2, CDH1, CHEK2, PALB2, PTEN, STK11 and TP53 genes. Testing was normal and did not reveal a mutation in these genes.     03/18/2018 Surgery   TOTAL LEFT MASTECTOMY WITH AXILLARY SENTINEL LYMPH NODE BIOPSY and  LEFT BREAST RECONSTRUCTION WITH PLACEMENT OF TISSUE EXPANDER AND FLEX HD (ACELLULAR HYDRATED DERMIS) by Dr. Excell Seltzer and Dr. Iran Planas  03/18/18   03/18/2018 Pathology Results   Diagnosis 03/18/18 1. Lymph node, sentinel, biopsy, Left Axillary #1 - METASTATIC CARCINOMA IN ONE OF ONE LYMPH NODES (1/1). 2. Lymph node, sentinel, biopsy, Left Axillary #2 - ONE OF ONE LYMPH NODES NEGATIVE FOR CARCINOMA (0/1). 3. Breast, simple mastectomy, Left Total - MICROINVASIVE DUCTAL CARCINOMA ARISING IN A BACKGROUND OF LOW GRADE DUCTAL CARCINOMA IN SITU. - DUCTAL CARCINOMA IN SITU PARTIALLY INVOLVES AN INTRADUCTAL PAPILLOMA. - RESECTION MARGINS ARE NEGATIVE FOR  INVASIVE CARCINOMA. - IN SITU CARCINOMA IS PRESENT AT THE ANTERIOR MARGIN BROADLY. - BIOPSY SITE. - SEE ONCOLOGY TABLE.    03/18/2018 Receptors her2   Estrogen Receptor: 100%, POSITIVE, MODERATE-WEAK STAINING INTENSITY Progesterone Receptor: 100%, POSITIVE, STRONG STAINING INTENSITY Proliferation Marker Ki67: 1% HER2 - NEGATIVE   03/18/2018 Cancer Staging   Staging form: Breast, AJCC 8th Edition - Pathologic stage from 03/18/2018: Stage IA (pT43m, pN1a, cM0, G1, ER+, PR+, HER2-) - Signed by [name] on 03/28/2018   05/18/2018 - 06/27/2018 Radiation Therapy   Adjuvant radiation by Dr. KSondra Come   06/27/2018 Mammogram   06/27/2018 Mammogram IMPRESSION: Benign appearing right breast calcifications.   Recommendation: Screening right mammogram is suggested in April 2020.   07/19/2018 -  Anti-estrogen oral therapy   Adjuvant tamoxifen     Survivorship   Per LCira Rue NP    03/07/2019 Surgery   REMOVAL OF LEFT TISSUE EXPANDER AND PLACEMENT OF SILICONE IMPLANT and LIPOFILLING FROM ABDOMEN TO LEFT CHEST and RIGHT BREAST MASTOPEXY by Dr. [name] 03/07/19   06/14/2020 Surgery   REVISION RIGHT BREAST REDUCTION and LIPOSUCTION BILATERAL CHEST WALL AND ABDOMEN; LIPOFILLING LEFT CHEST by Dr [name]      INTERVAL HISTORY:  TFeliberto Hartswas contacted for a follow up of DCIS. She was last seen by me on 12/29/21. She reports she is doing well overall. She notes she is doing much better on lower dose tamoxifen. She notes persistent but mild joint pain.    All other  systems were reviewed with the patient and are negative.  MEDICAL HISTORY:  Past Medical History:  Diagnosis Date   Anemia    required iron transfusions while having periods   Anxiety    Breast cancer ([facility]) 2019   left breast   Ductal carcinoma in situ (DCIS) of left breast 02/03/2018   Family history of breast cancer    Family history of ovarian cancer    Genetic testing 02/21/2018   STAT Breast panel with reflex  to Breast/GYN panel (23 genes) @ [facility] - No pathogenic mutations detected   Hypertension    PONV (postoperative nausea and vomiting)    Uterine fibroid    Vaginal delivery 1992, 1994, 1997    SURGICAL HISTORY: Past Surgical History:  Procedure Laterality Date   AXILLARY SURGERY     BREAST RECONSTRUCTION WITH PLACEMENT OF TISSUE EXPANDER AND FLEX HD (ACELLULAR HYDRATED DERMIS) Left 03/18/2018   Procedure: LEFT BREAST RECONSTRUCTION WITH PLACEMENT OF TISSUE EXPANDER AND FLEX HD (ACELLULAR HYDRATED DERMIS);  Surgeon: [name], MD;  Location: [facility];  Service: Plastics;  Laterality: Left;   BREAST REDUCTION SURGERY Right 06/14/2020   Procedure: REVISION RIGHT BREAST REDUCTION;  Surgeon: [name], MD;  Location: [facility];  Service: Plastics;  Laterality: Right;   DENTAL SURGERY N/A 07/08/2018   KNEE ARTHROSCOPY Left 2013   LIPOSUCTION WITH LIPOFILLING Left 03/07/2019   Procedure: LIPOFILLING FROM ABDOMEN TO LEFT CHEST;  Surgeon: [name], MD;  Location: [facility];  Service: Plastics;  Laterality: Left;   LIPOSUCTION WITH LIPOFILLING Bilateral 06/14/2020   Procedure: LIPOSUCTION BILATERAL CHEST WALL AND ABDOMEN; LIPOFILLING LEFT CHEST;  Surgeon: [name], MD;  Location: [facility];  Service: Plastics;  Laterality: Bilateral;   MASTECTOMY Left 2019/5   MASTECTOMY W/ SENTINEL NODE BIOPSY Left 03/18/2018   Procedure: TOTAL LEFT MASTECTOMY WITH AXILLARY SENTINEL LYMPH NODE BIOPSY;  Surgeon: Excell Seltzer, MD;  Location: [facility];  Service: General;  Laterality: Left;   MASTOPEXY Right 03/07/2019   Procedure: RIGHT BREAST MASTOPEXY;  Surgeon: [name], MD;  Location: [facility];  Service: Plastics;  Laterality: Right;   REDUCTION MAMMAPLASTY Right 02/2019   REMOVAL OF TISSUE EXPANDER AND PLACEMENT OF IMPLANT Left 03/07/2019   Procedure: REMOVAL OF LEFT  TISSUE EXPANDER AND PLACEMENT OF SILICONE IMPLANT;  Surgeon: [name], MD;  Location: [facility];  Service: Plastics;  Laterality: Left;   TOOTH EXTRACTION N/A 07/08/2018   Procedure: DENTAL RESTORATION/EXTRACTIONS;  Surgeon: Diona Browner, DDS;  Location: [place];  Service: Oral Surgery;  Laterality: N/A;   TUBAL LIGATION      I have reviewed the social history and family history with the patient and they are unchanged from previous note.  ALLERGIES:  has No Known Allergies.  MEDICATIONS:  Current Outpatient Medications  Medication Sig Dispense Refill   ALPRAZolam (XANAX) 0.5 MG tablet Take 1 tablet (0.5 mg total) by mouth at bedtime as needed for anxiety or sleep. 15 tablet 0   Blood Pressure Monitoring (BLOOD PRESSURE MONITOR AUTOMAT) DEVI TO CHECK BLOOD PRESSURE TWICE DAILY AS DIRECTED 1 each 0   Lancets 33G MISC USE TO CHECK BLOOD SUGAR DAILY AND PRN 100 each 4   lisinopril (ZESTRIL) 40 MG tablet Take 1 tablet (40 mg total) by mouth daily. 90 tablet 1   propranolol (INDERAL) 20 MG tablet Take 1 tablet (20 mg total) by mouth 3 (three) times daily. 90 tablet 3  tamoxifen (NOLVADEX) 10 MG tablet Take 1 tablet (10 mg total) by mouth daily. 30 tablet 5   valACYclovir (VALTREX) 1000 MG tablet Take two tablets ( total 2000 mg) by mouth q12h x 1 day; Start: ASAP after symptom onset 30 tablet 1   No current facility-administered medications for this visit.    PHYSICAL EXAMINATION: ECOG PERFORMANCE STATUS: 0 - Asymptomatic  There were no vitals filed for this visit. Wt Readings from Last 3 Encounters:  03/31/22 180 lb (81.6 kg)  01/19/22 177 lb (80.3 kg)  12/29/21 177 lb 8 oz (80.5 kg)     No vitals taken today, Exam not performed today  LABORATORY DATA:  I have reviewed the data as listed    Latest Ref Rng & Units 06/26/2022    9:15 AM 12/29/2021    8:15 AM 06/13/2021    8:28 AM  CBC  WBC 4.0 - 10.5 K/uL 4.1  3.3  3.5   Hemoglobin 12.0 - 15.0 g/dL 13.6  13.7   14.0   Hematocrit 36.0 - 46.0 % 39.8  40.4  40.6   Platelets 150 - 400 K/uL 230  252  301         Latest Ref Rng & Units 06/26/2022    9:15 AM 12/29/2021    8:15 AM 06/13/2021    8:28 AM  CMP  Glucose 70 - 99 mg/dL 98  92  108   BUN 6 - 20 mg/dL '13  13  14   ' Creatinine 0.44 - 1.00 mg/dL 0.89  0.93  0.98   Sodium 135 - 145 mmol/L 142  139  137   Potassium 3.5 - 5.1 mmol/L 4.0  3.6  4.3   Chloride 98 - 111 mmol/L 108  101  101   CO2 22 - 32 mmol/L '27  29  26   ' Calcium 8.9 - 10.3 mg/dL 9.2  9.4  9.9   Total Protein 6.5 - 8.1 g/dL 7.1  7.2  7.9   Total Bilirubin 0.3 - 1.2 mg/dL 0.5  0.6  0.6   Alkaline Phos 38 - 126 U/L 50  44  49   AST 15 - 41 U/L '12  13  14   ' ALT 0 - 44 U/L '7  9  13       ' RADIOGRAPHIC STUDIES: I have personally reviewed the radiological images as listed and agreed with the findings in the report. No results found.    Orders Placed This Encounter  Procedures   MM Digital Screening Unilat R    Standing Status:   Future    Standing Expiration Date:   07/03/2023    Order Specific Question:   Reason for Exam (SYMPTOM  OR DIAGNOSIS REQUIRED)    Answer:   screening    Order Specific Question:   Is the patient pregnant?    Answer:   Yes    Order Specific Question:   Preferred imaging location?    Answer:   [facility]   All questions were answered. The patient knows to call the clinic with any problems, questions or concerns. No barriers to learning was detected. The total time spent in the appointment was 22 minutes.     Truitt Merle, MD 07/03/2022   I, Wilburn Mylar, am acting as scribe for Truitt Merle, MD.   I have reviewed the above documentation for accuracy and completeness, and I agree with the above.

## 2022-07-06 ENCOUNTER — Telehealth: Payer: Self-pay | Admitting: Hematology

## 2022-07-06 NOTE — Telephone Encounter (Signed)
Scheduled follow-up appointment per 9/15 los. Patient is aware.

## 2022-08-02 ENCOUNTER — Other Ambulatory Visit: Payer: Self-pay | Admitting: Physician Assistant

## 2022-08-03 NOTE — Telephone Encounter (Signed)
Patient was told her BP medication was  denied. Would like to know why  and receive a refill - no apts sch at the moment. Would like a call back to confirm.

## 2022-08-03 NOTE — Telephone Encounter (Signed)
Spoke to pt told her Rx for Lisinopril was not denied. I just received the request today and sent refill to pharmacy. Told pt you do need to schedule an appt you are overdue. Pt verbalized understanding and will call back to schedule.

## 2022-10-28 ENCOUNTER — Ambulatory Visit: Payer: Medicaid Other | Admitting: Physician Assistant

## 2022-10-30 ENCOUNTER — Ambulatory Visit: Payer: Medicaid Other | Admitting: Physician Assistant

## 2022-11-02 ENCOUNTER — Encounter (HOSPITAL_COMMUNITY): Payer: Self-pay | Admitting: Emergency Medicine

## 2022-11-02 ENCOUNTER — Ambulatory Visit (HOSPITAL_COMMUNITY)
Admission: EM | Admit: 2022-11-02 | Discharge: 2022-11-02 | Disposition: A | Discharge status: Home / Self Care | Payer: Medicaid Other | Attending: Nurse Practitioner | Admitting: Nurse Practitioner

## 2022-11-02 DIAGNOSIS — J206 Acute bronchitis due to rhinovirus: Secondary | ICD-10-CM | POA: Diagnosis not present

## 2022-11-02 MED ORDER — AMOXICILLIN 875 MG PO TABS: 875.0000 mg | ORAL_TABLET | Freq: Two times a day (BID) | ORAL | 0 refills | Status: AC

## 2022-11-02 MED ORDER — METHYLPREDNISOLONE 4 MG PO TBPK: ORAL_TABLET | ORAL | 0 refills | Status: DC

## 2022-11-02 MED ORDER — MUCINEX DM MAXIMUM STRENGTH 60-1200 MG PO TB12: 1.0000 | ORAL_TABLET | Freq: Two times a day (BID) | ORAL | 0 refills | Status: DC

## 2022-11-02 MED ORDER — PSEUDOEPH-BROMPHEN-DM 30-2-10 MG/5ML PO SYRP: 5.0000 mL | ORAL_SOLUTION | Freq: Four times a day (QID) | ORAL | 0 refills | Status: DC | PRN

## 2022-11-02 NOTE — ED Triage Notes (Signed)
Triaged by provider - cough x 3 weeks

## 2022-11-02 NOTE — ED Provider Notes (Signed)
[place]    CSN: [id number] Arrival date & time: 11/02/22  1935      History   Chief Complaint Chief Complaint  Patient presents with   Cough    HPI ARYANAH ENSLOW is a 52 y.o. female.   Subjective:   VALORA NORELL is a 52 y.o. female who presents for evaluation of symptoms of a URI.  Patient reports having "a cold" about 2 weeks ago.  All of her symptoms have since resolved except a lingering cough.  Cough is productive of clear/white sputum.  There is occasional wheezing but no shortness of breath or chest congestion.  Patient has tried over-the-counter decongestants and Mucinex without any relief in the cough.  Patient does not smoke.  She has a history of breast cancer.  Initially diagnosed in April 2019. Treated with left total mastectomy, breast reconstruction and radiation. She is currently on Tamoxifen since being in remission. Last mammogram was in March 2023 and was unremarkable. She is due for her routine follow-up in March 2024.   The following portions of the patient's history were reviewed and updated as appropriate: allergies, current medications, past family history, past medical history, past social history, past surgical history, and problem list.     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   Family history of breast cancer    Family history of ovarian cancer    Genetic testing 02/21/2018   STAT Breast panel with reflex to Breast/GYN panel (23 genes) @ [facility] - No pathogenic mutations detected   Hypertension    PONV (postoperative nausea and vomiting)    Uterine fibroid    Vaginal delivery 1992, 1994, 1997    Patient Active Problem List   Diagnosis Date Noted   Insulin resistance 12/17/2020   Breast cancer, left breast ([place]) 03/18/2018   Genetic testing 02/21/2018    Family history of breast cancer    Family history of ovarian cancer    Iron deficiency anemia due to chronic blood loss 02/09/2018   Malignant neoplasm of upper-outer quadrant of left female breast ([place]) 02/03/2018   Breast wound, left, subsequent encounter 01/12/2018   Hypertension    Anxiety with depression    Uterine fibroid 09/29/2014   Menorrhagia 09/29/2014   Anemia 09/29/2014   Sinus tachycardia     Past Surgical History:  Procedure Laterality Date   AXILLARY SURGERY     BREAST RECONSTRUCTION WITH PLACEMENT OF TISSUE EXPANDER AND FLEX HD (ACELLULAR HYDRATED DERMIS) Left 03/18/2018   Procedure: LEFT BREAST RECONSTRUCTION WITH PLACEMENT OF TISSUE EXPANDER AND FLEX HD (ACELLULAR HYDRATED DERMIS);  Surgeon: [name], MD;  Location: [place];  Service: Plastics;  Laterality: Left;   BREAST REDUCTION SURGERY Right 06/14/2020   Procedure: REVISION RIGHT BREAST REDUCTION;  Surgeon: [name], MD;  Location: [place];  Service: Plastics;  Laterality: Right;   DENTAL SURGERY N/A 07/08/2018   KNEE ARTHROSCOPY Left 2013   LIPOSUCTION WITH LIPOFILLING Left 03/07/2019   Procedure: LIPOFILLING FROM ABDOMEN TO LEFT CHEST;  Surgeon: [name], MD;  Location: [place];  Service: Plastics;  Laterality: Left;   LIPOSUCTION WITH LIPOFILLING Bilateral 06/14/2020   Procedure: LIPOSUCTION BILATERAL CHEST WALL AND ABDOMEN; LIPOFILLING LEFT CHEST;  Surgeon: [name], MD;  Location:  [place];  Service: Plastics;  Laterality: Bilateral;   MASTECTOMY Left 2019/5   MASTECTOMY W/ SENTINEL NODE BIOPSY Left 03/18/2018   Procedure: TOTAL LEFT MASTECTOMY WITH AXILLARY SENTINEL LYMPH NODE BIOPSY;  Surgeon: Excell Seltzer, MD;  Location: [facility] [place];  Service: General;  Laterality: Left;   MASTOPEXY Right 03/07/2019   Procedure: RIGHT BREAST MASTOPEXY;  Surgeon: [name], MD;  Location: [place];  Service: Plastics;  Laterality: Right;   REDUCTION MAMMAPLASTY Right 02/2019   REMOVAL OF TISSUE EXPANDER AND PLACEMENT OF IMPLANT Left 03/07/2019   Procedure: REMOVAL OF LEFT TISSUE EXPANDER AND PLACEMENT OF SILICONE IMPLANT;  Surgeon: [name], MD;  Location: [place];  Service: Plastics;  Laterality: Left;   TOOTH EXTRACTION N/A 07/08/2018   Procedure: DENTAL RESTORATION/EXTRACTIONS;  Surgeon: Diona Browner, DDS;  Location: [place];  Service: Oral Surgery;  Laterality: N/A;   TUBAL LIGATION      OB History     Gravida  6   Para      Term      Preterm      AB  3   Living  3      SAB  2   IAB  1   Ectopic      Multiple      Live Births  3        Obstetric Comments  SVD x 3          Home Medications    Prior to Admission medications   Medication Sig Start Date End Date Taking? Authorizing Provider  amoxicillin (AMOXIL) 875 MG tablet Take 1 tablet (875 mg total) by mouth 2 (two) times daily for 7 days. 11/02/22 11/09/22 Yes Enrique Sack, FNP  brompheniramine-pseudoephedrine-DM 30-2-10 MG/5ML syrup Take 5 mLs by mouth 4 (four) times daily as needed (cough). 11/02/22  Yes Niomi Valent, Aldona Bar, FNP  Dextromethorphan-guaiFENesin (MUCINEX DM MAXIMUM STRENGTH) 60-1200 MG TB12 Take 1 tablet by mouth 2 (two) times daily. 11/02/22  Yes Enrique Sack, FNP  methylPREDNISolone (MEDROL DOSEPAK) 4 MG TBPK tablet Take as directed. Start on the morning of 11/03/22 11/02/22  Yes Enrique Sack, FNP  ALPRAZolam Duanne Moron) 0.5 MG tablet Take 1 tablet (0.5 mg total) by mouth at bedtime as needed for anxiety or sleep. 07/03/22   Truitt Merle, MD  Blood Pressure Monitoring (BLOOD PRESSURE MONITOR AUTOMAT) DEVI TO CHECK BLOOD PRESSURE TWICE DAILY AS DIRECTED 01/19/22   [name], PA  Lancets 33G MISC USE TO CHECK BLOOD SUGAR DAILY AND PRN 12/31/20   [name], PA  lisinopril (ZESTRIL) 40 MG tablet TAKE 1 TABLET(40 MG) BY MOUTH DAILY  08/03/22   [name], PA  propranolol (INDERAL) 20 MG tablet Take 1 tablet (20 mg total) by mouth 3 (three) times daily. 12/17/20   [name], PA  tamoxifen (NOLVADEX) 10 MG tablet Take 1 tablet (10 mg total) by mouth daily. 07/03/22   Truitt Merle, MD  valACYclovir (VALTREX) 1000 MG tablet Take two tablets ( total 2000 mg) by mouth q12h x 1 day; Start: ASAP after symptom onset 03/31/22   [name], PA    Family History Family History  Problem Relation Age of Onset   Hypertension Mother    Stroke Mother    Ovarian cancer Maternal Grandmother        dx 71s; deceased 5s. She declined therapy of any kind including surgery and chemo   Breast cancer Cousin        dx  5s; currently 13s; daughter of matenral uncle   Leukemia Maternal Aunt        currently 61   Stroke Sister     Social History Social History   Tobacco Use   Smoking status: Never   Smokeless tobacco: Never  Vaping Use   Vaping Use: Never used  Substance Use Topics   Alcohol use: No   Drug use: No     Allergies   Patient has no known allergies.   Review of Systems Review of Systems  Constitutional:  Positive for fatigue. Negative for activity change, appetite change and fever.  HENT:  Negative for congestion, rhinorrhea and sore throat.   Respiratory:  Positive for cough. Negative for shortness of breath.        Occasional wheezing  Cardiovascular:  Negative for chest pain, palpitations and leg swelling.  Gastrointestinal:  Positive for vomiting. Negative for nausea.       Occasional vomiting due to coughing  Genitourinary:        Stress incontinence due to coughing  Neurological:  Negative for dizziness and headaches.  All other systems reviewed and are negative.    Physical Exam Triage Vital Signs ED Triage Vitals  Enc Vitals Group     BP      Pulse      Resp      Temp      Temp src      SpO2      Weight      Height      Head Circumference      Peak Flow      Pain Score       Pain Loc      Pain Edu?      Excl. in [place]?    No data found.  Updated Vital Signs BP (!) 142/101 (BP Location: Right Arm)   Pulse (!) 109   Temp 99 F (37.2 C) (Oral)   Resp 16   LMP  (LMP Unknown) Comment: pt reports last period 11 months ago  SpO2 97%   Visual Acuity Right Eye Distance:   Left Eye Distance:   Bilateral Distance:    Right Eye Near:   Left Eye Near:    Bilateral Near:     Physical Exam Vitals reviewed.  Constitutional:      General: She is not in acute distress.    Appearance: Normal appearance. She is not ill-appearing, toxic-appearing or diaphoretic.  HENT:     Head: Normocephalic.  Cardiovascular:     Rate and Rhythm: Normal rate and regular rhythm.  Pulmonary:     Effort: Pulmonary effort is normal. No respiratory distress.     Breath sounds: Normal breath sounds. No wheezing.     Comments: Episodic coughing noted during exam but lungs are clear to auscultation bilaterally without any abnormalities  Musculoskeletal:        General: Normal range of motion.     Cervical back: Normal range of motion and neck supple.  Skin:    General: Skin is warm and dry.  Neurological:     General: No focal deficit present.     Mental Status: She is alert and oriented to person, place, and time.      UC Treatments / Results  Labs (all labs ordered are listed, but only abnormal results are displayed) Labs Reviewed - No data to display  EKG   Radiology No results found.  Procedures Procedures (including critical care time)  Medications Ordered  in UC Medications - No data to display  Initial Impression / Assessment and Plan / UC Course  I have reviewed the triage vital signs and the nursing notes.  Pertinent labs & imaging results that were available during my care of the patient were reviewed by me and considered in my medical decision making (see chart for details).     52 year old female presenting with acute bronchitis likely due to  rhinovirus. Patient and oriented.  Afebrile.  Nontoxic.  Physical exam as above.  Medications per orders.  Supportive care.   Today's evaluation has revealed no signs of a dangerous process. Discussed diagnosis with patient and/or guardian. Patient and/or guardian aware of their diagnosis, possible red flag symptoms to watch out for and need for close follow up. Patient and/or guardian understands verbal and written discharge instructions. Patient and/or guardian comfortable with plan and disposition.  Patient and/or guardian has a clear mental status at this time, good insight into illness (after discussion and teaching) and has clear judgment to make decisions regarding their care  Documentation was completed with the aid of voice recognition software. Transcription may contain typographical errors. Final Clinical Impressions(s) / UC Diagnoses   Final diagnoses:  Acute bronchitis due to Rhinovirus     Discharge Instructions      Your symptoms are likely due to bronchitis due to respiratory infection. A respiratory infection is an illness that affects part of the respiratory system, such as the lungs, nose, or throat. Take medications as prescribed. Drink plenty of fluids.  Ensure you change her toothbrush as well.  Feel better soon!  Aldona Bar, FNP-C        ED Prescriptions     Medication Sig Dispense Auth. Provider   amoxicillin (AMOXIL) 875 MG tablet Take 1 tablet (875 mg total) by mouth 2 (two) times daily for 7 days. 14 tablet Kam Kushnir, [place], FNP   methylPREDNISolone (MEDROL DOSEPAK) 4 MG TBPK tablet Take as directed. Start on the morning of 11/03/22 21 tablet Anamari Galeas, [place], FNP   Dextromethorphan-guaiFENesin (MUCINEX DM MAXIMUM STRENGTH) 60-1200 MG TB12 Take 1 tablet by mouth 2 (two) times daily. 20 tablet Enrique Sack, FNP   brompheniramine-pseudoephedrine-DM 30-2-10 MG/5ML syrup Take 5 mLs by mouth 4 (four) times daily as needed (cough). 120 mL Enrique Sack, FNP       PDMP not reviewed this encounter.   Enrique Sack, [place] 11/02/22 2058

## 2022-11-02 NOTE — Discharge Instructions (Addendum)
Your symptoms are likely due to bronchitis due to respiratory infection. A respiratory infection is an illness that affects part of the respiratory system, such as the lungs, nose, or throat. Take medications as prescribed. Drink plenty of fluids.  Ensure you change her toothbrush as well.  Feel better soon!  Aldona Bar, FNP-C

## 2022-11-08 ENCOUNTER — Other Ambulatory Visit: Payer: Self-pay | Admitting: Physician Assistant

## 2022-11-23 ENCOUNTER — Other Ambulatory Visit: Payer: Self-pay | Admitting: Physician Assistant

## 2022-11-23 ENCOUNTER — Other Ambulatory Visit: Payer: Self-pay | Admitting: Hematology

## 2022-11-23 ENCOUNTER — Encounter: Payer: Self-pay | Admitting: Physician Assistant

## 2022-11-23 DIAGNOSIS — Z170 Estrogen receptor positive status [ER+]: Secondary | ICD-10-CM

## 2022-11-24 MED ORDER — ALPRAZOLAM 0.5 MG PO TABS: 0.5000 mg | ORAL_TABLET | Freq: Every evening | ORAL | 0 refills | Status: DC | PRN

## 2022-11-24 NOTE — Telephone Encounter (Signed)
Dr. Cherlynn Kaiser, will you please refill Xanax for patient since Sam is out. Thanks

## 2022-12-14 ENCOUNTER — Ambulatory Visit (HOSPITAL_COMMUNITY)
Admission: EM | Admit: 2022-12-14 | Discharge: 2022-12-14 | Disposition: A | Discharge status: Home / Self Care | Payer: BC Managed Care – PPO | Attending: Internal Medicine | Admitting: Internal Medicine

## 2022-12-14 ENCOUNTER — Encounter (HOSPITAL_COMMUNITY): Payer: Self-pay

## 2022-12-14 DIAGNOSIS — N898 Other specified noninflammatory disorders of vagina: Secondary | ICD-10-CM | POA: Insufficient documentation

## 2022-12-14 DIAGNOSIS — R300 Dysuria: Secondary | ICD-10-CM | POA: Diagnosis present

## 2022-12-14 LAB — POCT URINALYSIS DIPSTICK, ED / UC
Bilirubin Urine: NEGATIVE
Glucose, UA: NEGATIVE mg/dL
Ketones, ur: NEGATIVE mg/dL
Nitrite: NEGATIVE
Protein, ur: NEGATIVE mg/dL
Specific Gravity, Urine: <=1.005 (ref 1.005–1.030)
Urobilinogen, UA: 0.2 mg/dL (ref 0.0–1.0)
pH: 5.5 (ref 5.0–8.0)

## 2022-12-14 MED ORDER — CEPHALEXIN 500 MG PO CAPS: 500.0000 mg | ORAL_CAPSULE | Freq: Two times a day (BID) | ORAL | 0 refills | Status: AC

## 2022-12-14 NOTE — Discharge Instructions (Addendum)
Your urine shows you likely have a urinary tract infection. I have sent your urine for culture to confirm this. We will go ahead and have you start taking antibiotics due to your symptoms.  Take antibiotic as directed.  (Keflex '500mg'$  every 12 hours for 7 days) If you develop diarrhea while taking this medication you may purchase an over-the-counter probiotic or eat yogurt with live active cultures.  To avoid GI upset please take this medication with food. I have sent your urine for culture to see what type of bacteria grows. We will call you if we need to change the treatment plan based on the results of your urine culture.  Your STD testing has been sent to the lab and will come back in the next 2 to 3 days.  We will call you if any of your results are positive requiring treatment and treat you at that time.   If you do not receive a phone call from [place], this means your testing was negative.  Avoid sexual intercourse until your STD results come back.  If any of your STD results are positive, you will need to avoid sexual intercourse for 7 days while you are being treated to prevent spread of STD.  Condom use is the best way to prevent spread of STDs.  If you develop any new or worsening symptoms or do not improve in the next 2 to 3 days, please return.  If your symptoms are severe, please go to the emergency room.  Follow-up with your primary care provider for further evaluation and management of your symptoms as well as ongoing wellness visits.  I hope you feel better!

## 2022-12-14 NOTE — ED Provider Notes (Signed)
[place]    CSN: [id number] Arrival date & time: 12/14/22  1024      History   Chief Complaint Chief Complaint  Patient presents with   Dysuria    HPI Casey Baker is a 52 y.o. female.   Patient presents to urgent care for evaluation of dysuria and urinary frequency that started 3 days ago.  She states she has been sexually active with 2 different female partners in the last 3 weeks and is concerned that she may have contracted an STD as well.  She is experiencing vaginal itching without vaginal rash, odor, or vaginal discharge.  She also states that she used a new type of flavored lubricant during one of her sexual encounters recently and she had never used this before.  She is concerned that this may be causing the itching to her vaginal area.  Vaginal itching started 3 days ago as well.  She is not diabetic and denies use of an SGLT2 inhibitor.  Denies frequent intake of urinary irritants.  She does not void after sexual intercourse routinely.  No known exposure to STD.  Partners are not having symptoms.  No history of HSV-2.  She is not having any fever, chills, abdominal pain, low back pain, diarrhea, flank pain, headache, or dizziness.  Has not attempted to have any over-the-counter medications before coming to urgent care for evaluation.   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   Family history of breast cancer    Family history of ovarian cancer    Genetic testing 02/21/2018   STAT Breast panel with reflex to Breast/GYN panel (23 genes) @ [facility] - No pathogenic mutations detected   Hypertension    PONV (postoperative nausea and vomiting)    Uterine fibroid    Vaginal delivery 1992, 1994, 1997    Patient Active Problem List   Diagnosis Date Noted   Insulin resistance  12/17/2020   Breast cancer, left breast ([place]) 03/18/2018   Genetic testing 02/21/2018   Family history of breast cancer    Family history of ovarian cancer    Iron deficiency anemia due to chronic blood loss 02/09/2018   Malignant neoplasm of upper-outer quadrant of left female breast ([place]) 02/03/2018   Breast wound, left, subsequent encounter 01/12/2018   Hypertension    Anxiety with depression    Uterine fibroid 09/29/2014   Menorrhagia 09/29/2014   Anemia 09/29/2014   Sinus tachycardia     Past Surgical History:  Procedure Laterality Date   AXILLARY SURGERY     BREAST RECONSTRUCTION WITH PLACEMENT OF TISSUE EXPANDER AND FLEX HD (ACELLULAR HYDRATED DERMIS) Left 03/18/2018   Procedure: LEFT BREAST RECONSTRUCTION WITH PLACEMENT OF TISSUE EXPANDER AND FLEX HD (ACELLULAR HYDRATED DERMIS);  Surgeon: [name], MD;  Location: [facility];  Service: Plastics;  Laterality: Left;   BREAST REDUCTION SURGERY Right 06/14/2020   Procedure: REVISION RIGHT BREAST REDUCTION;  Surgeon: [name], MD;  Location: [facility];  Service: Plastics;  Laterality: Right;   DENTAL SURGERY N/A 07/08/2018   KNEE ARTHROSCOPY Left 2013   LIPOSUCTION WITH LIPOFILLING Left 03/07/2019   Procedure: LIPOFILLING FROM ABDOMEN TO LEFT CHEST;  Surgeon: [name], MD;  Location: [facility];  Service: Plastics;  Laterality:  Left;   LIPOSUCTION WITH LIPOFILLING Bilateral 06/14/2020   Procedure: LIPOSUCTION BILATERAL CHEST WALL AND ABDOMEN; LIPOFILLING LEFT CHEST;  Surgeon: [name], MD;  Location: [facility];  Service: Plastics;  Laterality: Bilateral;   MASTECTOMY Left 2019/5   MASTECTOMY W/ SENTINEL NODE BIOPSY Left 03/18/2018   Procedure: TOTAL LEFT MASTECTOMY WITH AXILLARY SENTINEL LYMPH NODE BIOPSY;  Surgeon: Excell Seltzer, MD;  Location: [facility];  Service: General;  Laterality: Left;   MASTOPEXY Right 03/07/2019    Procedure: RIGHT BREAST MASTOPEXY;  Surgeon: [name], MD;  Location: [facility];  Service: Plastics;  Laterality: Right;   REDUCTION MAMMAPLASTY Right 02/2019   REMOVAL OF TISSUE EXPANDER AND PLACEMENT OF IMPLANT Left 03/07/2019   Procedure: REMOVAL OF LEFT TISSUE EXPANDER AND PLACEMENT OF SILICONE IMPLANT;  Surgeon: [name], MD;  Location: [facility];  Service: Plastics;  Laterality: Left;   TOOTH EXTRACTION N/A 07/08/2018   Procedure: DENTAL RESTORATION/EXTRACTIONS;  Surgeon: Diona Browner, DDS;  Location: [facility];  Service: Oral Surgery;  Laterality: N/A;   TUBAL LIGATION      OB History     Gravida  6   Para      Term      Preterm      AB  3   Living  3      SAB  2   IAB  1   Ectopic      Multiple      Live Births  3        Obstetric Comments  SVD x 3          Home Medications    Prior to Admission medications   Medication Sig Start Date End Date Taking? Authorizing Provider  ALPRAZolam Duanne Moron) 0.5 MG tablet Take 1 tablet (0.5 mg total) by mouth at bedtime as needed for anxiety or sleep. 11/24/22  Yes Tawnya Crook, MD  Blood Pressure Monitoring (BLOOD PRESSURE MONITOR AUTOMAT) DEVI TO CHECK BLOOD PRESSURE TWICE DAILY AS DIRECTED 01/19/22  Yes [name], PA  cephALEXin (KEFLEX) 500 MG capsule Take 1 capsule (500 mg total) by mouth 2 (two) times daily for 7 days. 12/14/22 12/21/22 Yes Talbot Grumbling, FNP  Lancets 33G MISC USE TO CHECK BLOOD SUGAR DAILY AND PRN 12/31/20  Yes Morene Rankins, Aldona Bar, PA  lisinopril (ZESTRIL) 40 MG tablet TAKE 1 TABLET(40 MG) BY MOUTH DAILY 11/09/22  Yes [name], PA  tamoxifen (NOLVADEX) 10 MG tablet Take 1 tablet (10 mg total) by mouth daily. 07/03/22  Yes Truitt Merle, MD  brompheniramine-pseudoephedrine-DM 30-2-10 MG/5ML syrup Take 5 mLs by mouth 4 (four) times daily as needed (cough). 11/02/22   Enrique Sack, FNP  Dextromethorphan-guaiFENesin (MUCINEX DM MAXIMUM  STRENGTH) 60-1200 MG TB12 Take 1 tablet by mouth 2 (two) times daily. 11/02/22   Enrique Sack, FNP  methylPREDNISolone (MEDROL DOSEPAK) 4 MG TBPK tablet Take as directed. Start on the morning of 11/03/22 11/02/22   Enrique Sack, FNP  propranolol (INDERAL) 20 MG tablet Take 1 tablet (20 mg total) by mouth 3 (three) times daily. 12/17/20   [name], PA  valACYclovir (VALTREX) 1000 MG tablet Take two tablets ( total 2000 mg) by mouth q12h x 1 day; Start: ASAP after symptom onset 03/31/22   [name], PA    Family History Family History  Problem Relation Age of Onset   Hypertension Mother    Stroke Mother    Ovarian cancer Maternal Grandmother  dx 72s; deceased 6s. She declined therapy of any kind including surgery and chemo   Breast cancer Cousin        dx 35s; currently 46s; daughter of matenral uncle   Leukemia Maternal Aunt        currently 39   Stroke Sister     Social History Social History   Tobacco Use   Smoking status: Never   Smokeless tobacco: Never  Vaping Use   Vaping Use: Never used  Substance Use Topics   Alcohol use: No   Drug use: No     Allergies   Patient has no known allergies.   Review of Systems Review of Systems  Genitourinary:  Positive for dysuria.  Per HPI   Physical Exam Triage Vital Signs ED Triage Vitals  Enc Vitals Group     BP 12/14/22 1119 (!) 153/90     Pulse --      Resp 12/14/22 1119 12     Temp 12/14/22 1119 98 F (36.7 C)     Temp Source 12/14/22 1119 Oral     SpO2 12/14/22 1119 99 %     Weight --      Height --      Head Circumference --      Peak Flow --      Pain Score 12/14/22 1112 3     Pain Loc --      Pain Edu? --      Excl. in [place]? --    No data found.  Updated Vital Signs BP (!) 153/90 (BP Location: Left Arm)   Temp 98 F (36.7 C) (Oral)   Resp 12   LMP  (LMP Unknown) Comment: pt reports last period 11 months ago  SpO2 99%   Visual Acuity Right Eye Distance:   Left Eye  Distance:   Bilateral Distance:    Right Eye Near:   Left Eye Near:    Bilateral Near:     Physical Exam Vitals and nursing note reviewed.  Constitutional:      Appearance: She is not ill-appearing or toxic-appearing.  HENT:     Head: Normocephalic and atraumatic.     Right Ear: Hearing and external ear normal.     Left Ear: Hearing and external ear normal.     Nose: Nose normal.     Mouth/Throat:     Lips: Pink.  Eyes:     General: Lids are normal. Vision grossly intact. Gaze aligned appropriately.     Extraocular Movements: Extraocular movements intact.     Conjunctiva/sclera: Conjunctivae normal.  Cardiovascular:     Rate and Rhythm: Normal rate and regular rhythm.     Heart sounds: Normal heart sounds, S1 normal and S2 normal.  Pulmonary:     Effort: Pulmonary effort is normal. No respiratory distress.     Breath sounds: Normal breath sounds and air entry.  Abdominal:     General: Bowel sounds are normal.     Palpations: Abdomen is soft.     Tenderness: There is no abdominal tenderness. There is no right CVA tenderness, left CVA tenderness or guarding.  Genitourinary:    Comments: Deferred. Musculoskeletal:     Cervical back: Neck supple.  Skin:    General: Skin is warm and dry.     Capillary Refill: Capillary refill takes less than 2 seconds.     Findings: No rash.  Neurological:     General: No focal deficit present.     Mental Status:  She is alert and oriented to person, place, and time. Mental status is at baseline.     Cranial Nerves: No dysarthria or facial asymmetry.  Psychiatric:        Mood and Affect: Mood normal.        Speech: Speech normal.        Behavior: Behavior normal.        Thought Content: Thought content normal.        Judgment: Judgment normal.      UC Treatments / Results  Labs (all labs ordered are listed, but only abnormal results are displayed) Labs Reviewed  POCT URINALYSIS DIPSTICK, ED / UC - Abnormal; Notable for the  following components:      Result Value   Hgb urine dipstick TRACE (*)    Leukocytes,Ua SMALL (*)    All other components within normal limits  URINE CULTURE  CERVICOVAGINAL ANCILLARY ONLY    EKG   Radiology No results found.  Procedures Procedures (including critical care time)  Medications Ordered in UC Medications - No data to display  Initial Impression / Assessment and Plan / UC Course  I have reviewed the triage vital signs and the nursing notes.  Pertinent labs & imaging results that were available during my care of the patient were reviewed by me and considered in my medical decision making (see chart for details).   1.  Dysuria Urinalysis is positive for trace hemoglobin and leukocytes, therefore will treat for acute cystitis with hematuria given patient's symptoms.  Urine culture pending.  Keflex sent to pharmacy to be taken as directed.  She is to push fluids to stay well-hydrated and avoid use of urinary irritants. Will call to change antibiotic as needed based on urine culture.   2. Vaginal itching STI labs pending.  Patient declines HIV and syphilis testing today.  Will notify patient of positive results and treat accordingly when labs come back.  Patient to avoid sexual intercourse until screening testing comes back.  Education provided regarding safe sexual practices and patient encouraged to use protection to prevent spread of STIs.   Discussed physical exam and available lab work findings in clinic with patient.  Counseled patient regarding appropriate use of medications and potential side effects for all medications recommended or prescribed today. Discussed red flag signs and symptoms of worsening condition,when to call the PCP office, return to urgent care, and when to seek higher level of care in the emergency department. Patient verbalizes understanding and agreement with plan. All questions answered. Patient discharged in stable condition.    Final Clinical  Impressions(s) / UC Diagnoses   Final diagnoses:  Dysuria  Vaginal itching     Discharge Instructions      Your urine shows you likely have a urinary tract infection. I have sent your urine for culture to confirm this. We will go ahead and have you start taking antibiotics due to your symptoms.  Take antibiotic as directed.  (Keflex '500mg'$  every 12 hours for 7 days) If you develop diarrhea while taking this medication you may purchase an over-the-counter probiotic or eat yogurt with live active cultures.  To avoid GI upset please take this medication with food. I have sent your urine for culture to see what type of bacteria grows. We will call you if we need to change the treatment plan based on the results of your urine culture.  Your STD testing has been sent to the lab and will come back in the next 2  to 3 days.  We will call you if any of your results are positive requiring treatment and treat you at that time.   If you do not receive a phone call from [place], this means your testing was negative.  Avoid sexual intercourse until your STD results come back.  If any of your STD results are positive, you will need to avoid sexual intercourse for 7 days while you are being treated to prevent spread of STD.  Condom use is the best way to prevent spread of STDs.  If you develop any new or worsening symptoms or do not improve in the next 2 to 3 days, please return.  If your symptoms are severe, please go to the emergency room.  Follow-up with your primary care provider for further evaluation and management of your symptoms as well as ongoing wellness visits.  I hope you feel better!     ED Prescriptions     Medication Sig Dispense Auth. Provider   cephALEXin (KEFLEX) 500 MG capsule Take 1 capsule (500 mg total) by mouth 2 (two) times daily for 7 days. 14 capsule Talbot Grumbling, FNP      PDMP not reviewed this encounter.   Talbot Grumbling, [place] 12/14/22 1217

## 2022-12-14 NOTE — ED Triage Notes (Signed)
Pt is here for possible STD exposure and or UTI except 3DAYS

## 2022-12-15 LAB — CERVICOVAGINAL ANCILLARY ONLY
Bacterial Vaginitis (gardnerella): POSITIVE — AB
Candida Glabrata: NEGATIVE
Candida Vaginitis: POSITIVE — AB
Chlamydia: NEGATIVE
Comment: NEGATIVE
Comment: NEGATIVE
Comment: NEGATIVE
Comment: NEGATIVE
Comment: NEGATIVE
Comment: NORMAL
Neisseria Gonorrhea: NEGATIVE
Trichomonas: NEGATIVE

## 2022-12-15 LAB — URINE CULTURE: Culture: NO GROWTH

## 2022-12-16 ENCOUNTER — Telehealth (HOSPITAL_COMMUNITY): Payer: Self-pay | Admitting: Emergency Medicine

## 2022-12-16 ENCOUNTER — Telehealth (HOSPITAL_COMMUNITY): Payer: Self-pay

## 2022-12-16 MED ORDER — FLUCONAZOLE 150 MG PO TABS: ORAL_TABLET | ORAL | 0 refills | Status: DC

## 2022-12-16 MED ORDER — METRONIDAZOLE 250 MG PO TABS: 500.0000 mg | ORAL_TABLET | Freq: Three times a day (TID) | ORAL | 0 refills | Status: AC

## 2022-12-16 NOTE — Telephone Encounter (Signed)
Patient calling in for results of her cytology swab. Have forwarded a message via secure chat to the providers on staff today to review results and request treatment.

## 2023-01-01 ENCOUNTER — Encounter: Payer: Self-pay | Admitting: Hematology

## 2023-01-01 ENCOUNTER — Ambulatory Visit
Admission: RE | Admit: 2023-01-01 | Discharge: 2023-01-01 | Disposition: A | Discharge status: Home / Self Care | Payer: Medicaid Other | Source: Ambulatory Visit | Attending: Hematology | Admitting: Hematology

## 2023-01-01 ENCOUNTER — Other Ambulatory Visit: Payer: Self-pay

## 2023-01-01 ENCOUNTER — Other Ambulatory Visit: Payer: Self-pay | Admitting: Hematology

## 2023-01-01 ENCOUNTER — Inpatient Hospital Stay: Payer: BC Managed Care – PPO

## 2023-01-01 ENCOUNTER — Telehealth: Payer: Self-pay

## 2023-01-01 ENCOUNTER — Inpatient Hospital Stay: Payer: BC Managed Care – PPO | Attending: Hematology | Admitting: Hematology

## 2023-01-01 VITALS — BP 131/97 | HR 106 | Temp 99.7°F | Resp 18 | Ht 68.5 in | Wt 189.3 lb

## 2023-01-01 DIAGNOSIS — Z79810 Long term (current) use of selective estrogen receptor modulators (SERMs): Secondary | ICD-10-CM | POA: Diagnosis not present

## 2023-01-01 DIAGNOSIS — F32A Depression, unspecified: Secondary | ICD-10-CM | POA: Insufficient documentation

## 2023-01-01 DIAGNOSIS — Z170 Estrogen receptor positive status [ER+]: Secondary | ICD-10-CM

## 2023-01-01 DIAGNOSIS — C50412 Malignant neoplasm of upper-outer quadrant of left female breast: Secondary | ICD-10-CM | POA: Diagnosis present

## 2023-01-01 DIAGNOSIS — F419 Anxiety disorder, unspecified: Secondary | ICD-10-CM | POA: Insufficient documentation

## 2023-01-01 LAB — CMP (CANCER CENTER ONLY)
ALT: 10 U/L (ref 0–44)
AST: 14 U/L — ABNORMAL LOW (ref 15–41)
Albumin: 4.3 g/dL (ref 3.5–5.0)
Alkaline Phosphatase: 57 U/L (ref 38–126)
Anion gap: 8 (ref 5–15)
BUN: 14 mg/dL (ref 6–20)
CO2: 26 mmol/L (ref 22–32)
Calcium: 9.4 mg/dL (ref 8.9–10.3)
Chloride: 107 mmol/L (ref 98–111)
Creatinine: 0.82 mg/dL (ref 0.44–1.00)
GFR, Estimated: >60 mL/min (ref >60)
Glucose, Bld: 100 mg/dL — ABNORMAL HIGH (ref 70–99)
Potassium: 4 mmol/L (ref 3.5–5.1)
Sodium: 141 mmol/L (ref 135–145)
Total Bilirubin: 0.5 mg/dL (ref 0.3–1.2)
Total Protein: 7.5 g/dL (ref 6.5–8.1)

## 2023-01-01 LAB — CBC WITH DIFFERENTIAL (CANCER CENTER ONLY)
Abs Immature Granulocytes: 0.01 10*3/uL (ref 0.00–0.07)
Basophils Absolute: 0 10*3/uL (ref 0.0–0.1)
Basophils Relative: 1 %
Eosinophils Absolute: 0 10*3/uL (ref 0.0–0.5)
Eosinophils Relative: 1 %
HCT: 40.4 % (ref 36.0–46.0)
Hemoglobin: 14 g/dL (ref 12.0–15.0)
Immature Granulocytes: 0 %
Lymphocytes Relative: 41 %
Lymphs Abs: 1.5 10*3/uL (ref 0.7–4.0)
MCH: 29.7 pg (ref 26.0–34.0)
MCHC: 34.7 g/dL (ref 30.0–36.0)
MCV: 85.6 fL (ref 80.0–100.0)
Monocytes Absolute: 0.4 10*3/uL (ref 0.1–1.0)
Monocytes Relative: 11 %
Neutro Abs: 1.8 10*3/uL (ref 1.7–7.7)
Neutrophils Relative %: 46 %
Platelet Count: 277 10*3/uL (ref 150–400)
RBC: 4.72 MIL/uL (ref 3.87–5.11)
RDW: 14.5 % (ref 11.5–15.5)
WBC Count: 3.7 10*3/uL — ABNORMAL LOW (ref 4.0–10.5)
nRBC: 0 % (ref 0.0–0.2)

## 2023-01-01 MED ORDER — TAMOXIFEN CITRATE 10 MG PO TABS: 10.0000 mg | ORAL_TABLET | Freq: Every day | ORAL | 5 refills | Status: DC

## 2023-01-01 MED ORDER — ALPRAZOLAM 0.5 MG PO TABS: 0.5000 mg | ORAL_TABLET | Freq: Every evening | ORAL | 0 refills | Status: DC | PRN

## 2023-01-01 NOTE — Assessment & Plan Note (Signed)
invasive ductal carcinoma with DCIS, [id number], stage IA, G1, ER/PR positive, HER2 negative -Diagnosed on 01/2018. Treated with left total mastectomy and breast reconstruction and radiation. Currently on adjuvant Tamoxifen. -most recent right mammogram on 12/19/20 was negative. Next scheduled for tomorrow, 12/30/21. -I again discussed her switching to AI for an additional 5 years when she becomes post-menopausal. She would like to continue Tamoxifen. Because of her insomnia, we will try cutting this down to 10 mg. We will also check her hormone levels to ensure she is postmenopausal. -She is clinically doing well. Lab reviewed, her CBC and CMP are within normal limits. Her physical exam was unremarkable. There is no clinical concern for recurrence. -Continue surveillance.  -Continue Tamoxifen, due to her significant fatigue, will decrease dose to 10 mg daily. We will check hormone levels at next visit to see if she is truly postmenopausal and if we can switch to AI. -f/u in 6 months

## 2023-01-01 NOTE — Progress Notes (Signed)
[place]   [phone] [phone]   Clinic Follow up Note   Patient Care Team: [name], [place] as PCP - General (Physician Assistant) Excell Seltzer, MD (Inactive) as Consulting Physician (General Surgery) Truitt Merle, MD as Consulting Physician (Hematology) Gery Pray, MD as Consulting Physician (Radiation Oncology) Alla Feeling, NP as Nurse Practitioner (Nurse Practitioner)  Date of Service:  01/01/2023  CHIEF COMPLAINT: f/u of left breast DCIS   CURRENT THERAPY:   Adjuvant Tamoxifen, started in early Oct 2019   ASSESSMENT:  Casey Baker is a 52 y.o. 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 Treated with left total mastectomy and breast reconstruction and radiation. Currently on adjuvant Tamoxifen. -most recent right mammogram on 12/19/20 was negative. Next scheduled for tomorrow, 12/30/21. -I again discussed her switching to AI for an additional 5 years when she becomes post-menopausal. She would like to continue Tamoxifen. Because of her insomnia, we will try cutting this down to 10 mg. We will also check her hormone levels to ensure she is postmenopausal. -She is clinically doing well. Lab reviewed, her CBC and CMP are within normal limits. Her physical exam was unremarkable. There is no clinical concern for recurrence. -Continue surveillance.  -Continue Tamoxifen, due to her significant fatigue, will decrease dose to 10 mg daily. We will check hormone levels at next visit to see if she is truly postmenopausal and if we can switch to AI. -Patient would like to come off tamoxifen after 5 years in October 2024.  She is reluctant to extend treatment to 10 years.  We discussed the role of breast cancer index, to predict her benefit of extended antiestrogen therapy.  She agrees to  proceed.  I will order, and discussed the results at her next visit. -f/u in 6 months    Anxiety and depression -She will continue Xanax as needed.  I refilled for her today.  PLAN: -lab reviewed. -I refill Tamoxifen 10 mg daily, she will continue. -I encourage pt to diet and exercise -lab,f/u in 6 months - Order BCI  test, and we discuss the results with her on next visit.  The test will determine if she would benefit from extended antiestrogen therapy. - I refill Xanax  SUMMARY OF ONCOLOGIC HISTORY: Oncology History Overview Note  Cancer Staging Ductal carcinoma in situ (DCIS) of left breast Staging form: Breast, AJCC 8th Edition - Clinical stage from 02/09/2018: Stage 0 (cTis (DCIS), cN0, cM0, ER+, PR+) - Unsigned - Pathologic stage from 03/18/2018: Stage IA (pT39mi, pN1a, cM0, G1, ER+, PR+, HER2-) - Signed by Truitt Merle, MD on 03/28/2018     Malignant neoplasm of upper-outer quadrant of left female breast ([place])  01/27/2018 Mammogram   IMPRESSION: Suspicious bloody left nipple discharge with palpable masses in the 2 o'clock axis of the left breast. Findings are suspicious for Malignancy. ADDENDUM: Magnification views of the retroareolar right breast were performed to evaluate calcifications seen on the patient's baseline mammogram. There are loosely grouped and scattered calcifications in the outer right breast, superior to and directly posterior to the nipple. These calcifications are rounded and smudgy in the CC projection, and many of them demonstrate layering on the 90 degree lateral magnification view, consistent with benign milk of calcium. No suspicious microcalcifications are seen in the right breast.   01/31/2018 Initial Biopsy   Diagnosis 01/31/18 1. Breast,  left, needle core biopsy, 2 o'clock, 6 cfn - LOW GRADE DUCTAL CARCINOMA IN SITU (DCIS) PARTIALLY INVOLVING AN INTRADUCTAL PAPILLOMA. - NEGATIVE FOR INVASIVE CARCINOMA. 2. Breast, left, needle core biopsy, 2  o'clock, 10 cfn - LOW GRADE DUCTAL CARCINOMA IN SITU (DCIS) PARTIALLY INVOLVING AN INTRADUCTAL PAPILLOMA. - NEGATIVE FOR INVASIVE CARCINOMA.   01/31/2018 Receptors her2   Prognostic indicators significant for: ER, 100% positive and PR, 100% positive, both with strong staining intensity.    02/03/2018 Initial Diagnosis   Ductal carcinoma in situ (DCIS) of left breast   02/10/2018 Genetic Testing   Testing did not reveal a pathogenic mutation in any of the genes analyzed. A copy of the genetic test report will be scanned into Epic under the Media tab.  The genes analyzed were the 23 genes on [facility]'s Breast/GYN panel (ATM, BARD1, BRCA1, BRCA2, BRIP1, CDH1, CHEK2, DICER1, EPCAM, MLH1,  MSH2, MSH6, NBN, NF1, PALB2, PMS2, PTEN, RAD50, RAD51C, RAD51D,SMARCA4, STK11, and TP53).  Genetic testing involved analysis of 9 genes: ATM, BRCA1, BRCA2, CDH1, CHEK2, PALB2, PTEN, STK11 and TP53 genes. Testing was normal and did not reveal a mutation in these genes.     03/18/2018 Surgery   TOTAL LEFT MASTECTOMY WITH AXILLARY SENTINEL LYMPH NODE BIOPSY and  LEFT BREAST RECONSTRUCTION WITH PLACEMENT OF TISSUE EXPANDER AND FLEX HD (ACELLULAR HYDRATED DERMIS) by Dr. Excell Seltzer and Dr. Iran Planas  03/18/18   03/18/2018 Pathology Results   Diagnosis 03/18/18 1. Lymph node, sentinel, biopsy, Left Axillary #1 - METASTATIC CARCINOMA IN ONE OF ONE LYMPH NODES (1/1). 2. Lymph node, sentinel, biopsy, Left Axillary #2 - ONE OF ONE LYMPH NODES NEGATIVE FOR CARCINOMA (0/1). 3. Breast, simple mastectomy, Left Total - MICROINVASIVE DUCTAL CARCINOMA ARISING IN A BACKGROUND OF LOW GRADE DUCTAL CARCINOMA IN SITU. - DUCTAL CARCINOMA IN SITU PARTIALLY INVOLVES AN INTRADUCTAL PAPILLOMA. - RESECTION MARGINS ARE NEGATIVE FOR INVASIVE CARCINOMA. - IN SITU CARCINOMA IS PRESENT AT THE ANTERIOR MARGIN BROADLY. - BIOPSY SITE. - SEE ONCOLOGY TABLE.    03/18/2018 Receptors her2   Estrogen Receptor: 100%, POSITIVE, MODERATE-WEAK  STAINING INTENSITY Progesterone Receptor: 100%, POSITIVE, STRONG STAINING INTENSITY Proliferation Marker Ki67: 1% HER2 - NEGATIVE   03/18/2018 Cancer Staging   Staging form: Breast, AJCC 8th Edition - Pathologic stage from 03/18/2018: Stage IA (pT105mi, pN1a, cM0, G1, ER+, PR+, HER2-) - Signed by Truitt Merle, MD on 03/28/2018   05/18/2018 - 06/27/2018 Radiation Therapy   Adjuvant radiation by Dr. Sondra Come    06/27/2018 Mammogram   06/27/2018 Mammogram IMPRESSION: Benign appearing right breast calcifications.   Recommendation: Screening right mammogram is suggested in April 2020.   07/19/2018 -  Anti-estrogen oral therapy   Adjuvant tamoxifen     Survivorship   Per Cira Rue, NP    03/07/2019 Surgery   REMOVAL OF LEFT TISSUE EXPANDER AND PLACEMENT OF SILICONE IMPLANT and LIPOFILLING FROM ABDOMEN TO LEFT CHEST and RIGHT BREAST MASTOPEXY by Dr. Iran Planas  03/07/19   06/14/2020 Surgery   REVISION RIGHT BREAST REDUCTION and LIPOSUCTION BILATERAL CHEST WALL AND ABDOMEN; LIPOFILLING LEFT CHEST by Dr Iran Planas       INTERVAL HISTORY:  Casey Baker is here for a follow up of left breast DCIS  She was last seen by me on 12/29/2021 She presents to the clinic alone. Pt state she has some hot flashes and she looses sleep. Pt is still taking the Tamoxifen 10 mg. Pt deny joint pain and have no issues.     All other systems were reviewed with the patient  and are negative.  MEDICAL HISTORY:  Past Medical History:  Diagnosis Date   Anemia    required iron transfusions while having periods   Anxiety    Breast cancer ([place]) 2019   left breast   Ductal carcinoma in situ (DCIS) of left breast 02/03/2018   Family history of breast cancer    Family history of ovarian cancer    Genetic testing 02/21/2018   STAT Breast panel with reflex to Breast/GYN panel (23 genes) @ [facility] - No pathogenic mutations detected   Hypertension    PONV (postoperative nausea and vomiting)    Uterine fibroid    Vaginal  delivery 1992, 1994, 1997    SURGICAL HISTORY: Past Surgical History:  Procedure Laterality Date   AXILLARY SURGERY     BREAST RECONSTRUCTION WITH PLACEMENT OF TISSUE EXPANDER AND FLEX HD (ACELLULAR HYDRATED DERMIS) Left 03/18/2018   Procedure: LEFT BREAST RECONSTRUCTION WITH PLACEMENT OF TISSUE EXPANDER AND FLEX HD (ACELLULAR HYDRATED DERMIS);  Surgeon: [name], MD;  Location: [facility];  Service: Plastics;  Laterality: Left;   BREAST REDUCTION SURGERY Right 06/14/2020   Procedure: REVISION RIGHT BREAST REDUCTION;  Surgeon: [name], MD;  Location: [place];  Service: Plastics;  Laterality: Right;   DENTAL SURGERY N/A 07/08/2018   KNEE ARTHROSCOPY Left 2013   LIPOSUCTION WITH LIPOFILLING Left 03/07/2019   Procedure: LIPOFILLING FROM ABDOMEN TO LEFT CHEST;  Surgeon: [name], MD;  Location: [place];  Service: Plastics;  Laterality: Left;   LIPOSUCTION WITH LIPOFILLING Bilateral 06/14/2020   Procedure: LIPOSUCTION BILATERAL CHEST WALL AND ABDOMEN; LIPOFILLING LEFT CHEST;  Surgeon: [name], MD;  Location: [place];  Service: Plastics;  Laterality: Bilateral;   MASTECTOMY Left 2019/5   MASTECTOMY W/ SENTINEL NODE BIOPSY Left 03/18/2018   Procedure: TOTAL LEFT MASTECTOMY WITH AXILLARY SENTINEL LYMPH NODE BIOPSY;  Surgeon: Excell Seltzer, MD;  Location: [place];  Service: General;  Laterality: Left;   MASTOPEXY Right 03/07/2019   Procedure: RIGHT BREAST MASTOPEXY;  Surgeon: [name], MD;  Location: [place];  Service: Plastics;  Laterality: Right;   REDUCTION MAMMAPLASTY Right 02/2019   REMOVAL OF TISSUE EXPANDER AND PLACEMENT OF IMPLANT Left 03/07/2019   Procedure: REMOVAL OF LEFT TISSUE EXPANDER AND PLACEMENT OF SILICONE IMPLANT;  Surgeon: [name], MD;  Location: [place];  Service: Plastics;  Laterality: Left;   TOOTH  EXTRACTION N/A 07/08/2018   Procedure: DENTAL RESTORATION/EXTRACTIONS;  Surgeon: Diona Browner, DDS;  Location: [place];  Service: Oral Surgery;  Laterality: N/A;   TUBAL LIGATION      I have reviewed the social history and family history with the patient and they are unchanged from previous note.  ALLERGIES:  has No Known Allergies.  MEDICATIONS:  Current Outpatient Medications  Medication Sig Dispense Refill   ALPRAZolam (XANAX) 0.5 MG tablet Take 1 tablet (0.5 mg total) by mouth at bedtime as needed for anxiety or sleep. 20 tablet 0   Blood Pressure Monitoring (BLOOD PRESSURE MONITOR AUTOMAT) DEVI TO CHECK BLOOD PRESSURE TWICE DAILY AS DIRECTED 1 each 0   brompheniramine-pseudoephedrine-DM 30-2-10 MG/5ML syrup Take 5 mLs by mouth 4 (four) times daily as needed (cough). 120 mL 0   Dextromethorphan-guaiFENesin (MUCINEX DM MAXIMUM STRENGTH) 60-1200 MG TB12 Take 1 tablet by mouth 2 (two) times daily. 20 tablet 0   fluconazole (DIFLUCAN) 150 MG tablet Take 150 mg today, then 150 mg in 72 hours. Please take one more  dose of 150 mg on the last day of your metronidazole for a total of 3 doses. 3 tablet 0   Lancets 33G MISC USE TO CHECK BLOOD SUGAR DAILY AND PRN 100 each 4   lisinopril (ZESTRIL) 40 MG tablet TAKE 1 TABLET(40 MG) BY MOUTH DAILY 90 tablet 0   methylPREDNISolone (MEDROL DOSEPAK) 4 MG TBPK tablet Take as directed. Start on the morning of 11/03/22 21 tablet 0   propranolol (INDERAL) 20 MG tablet Take 1 tablet (20 mg total) by mouth 3 (three) times daily. 90 tablet 3   tamoxifen (NOLVADEX) 10 MG tablet Take 1 tablet (10 mg total) by mouth daily. 30 tablet 5   valACYclovir (VALTREX) 1000 MG tablet Take two tablets ( total 2000 mg) by mouth q12h x 1 day; Start: ASAP after symptom onset 30 tablet 1   No current facility-administered medications for this visit.    PHYSICAL EXAMINATION: ECOG PERFORMANCE STATUS: 1 - Symptomatic but completely ambulatory  Vitals:   01/01/23 1054  BP: (!)  131/97  Pulse: (!) 106  Resp: 18  Temp: 99.7 F (37.6 C)  SpO2: 99%   Wt Readings from Last 3 Encounters:  01/01/23 189 lb 4.8 oz (85.9 kg)  03/31/22 180 lb (81.6 kg)  01/19/22 177 lb (80.3 kg)     NECK: (-)supple, thyroid normal size, non-tender, without nodularity LYMPH: (-)  no palpable lymphadenopathy in the cervical, axillary  BREAST: LT breast  mastectomy and implant and Rt breast reduction no palpable mass breast exam benign.   LABORATORY DATA:  I have reviewed the data as listed    Latest Ref Rng & Units 01/01/2023   10:27 AM 06/26/2022    9:15 AM 12/29/2021    8:15 AM  CBC  WBC 4.0 - 10.5 K/uL 3.7  4.1  3.3   Hemoglobin 12.0 - 15.0 g/dL 14.0  13.6  13.7   Hematocrit 36.0 - 46.0 % 40.4  39.8  40.4   Platelets 150 - 400 K/uL 277  230  252         Latest Ref Rng & Units 01/01/2023   10:27 AM 06/26/2022    9:15 AM 12/29/2021    8:15 AM  CMP  Glucose 70 - 99 mg/dL 100  98  92   BUN 6 - 20 mg/dL 14  13  13    Creatinine 0.44 - 1.00 mg/dL 0.82  0.89  0.93   Sodium 135 - 145 mmol/L 141  142  139   Potassium 3.5 - 5.1 mmol/L 4.0  4.0  3.6   Chloride 98 - 111 mmol/L 107  108  101   CO2 22 - 32 mmol/L 26  27  29    Calcium 8.9 - 10.3 mg/dL 9.4  9.2  9.4   Total Protein 6.5 - 8.1 g/dL 7.5  7.1  7.2   Total Bilirubin 0.3 - 1.2 mg/dL 0.5  0.5  0.6   Alkaline Phos 38 - 126 U/L 57  50  44   AST 15 - 41 U/L 14  12  13    ALT 0 - 44 U/L 10  7  9        RADIOGRAPHIC STUDIES: I have personally reviewed the radiological images as listed and agreed with the findings in the report. No results found.    No orders of the defined types were placed in this encounter.  All questions were answered. The patient knows to call the clinic with any problems, questions or concerns. No barriers to  learning was detected. The total time spent in the appointment was 30 minutes.     Truitt Merle, MD 01/01/2023   Felicity Coyer, CMA, am acting as scribe for Truitt Merle, MD.   I have reviewed  the above documentation for accuracy and completeness, and I agree with the above.

## 2023-01-01 NOTE — Telephone Encounter (Signed)
Faxed over the request for Breast Cancer Index Testing, included last office note, pathology report, demographics, insurance cards, and med list with the request.

## 2023-01-03 LAB — FOLLICLE STIMULATING HORMONE: FSH: 9.4 m[IU]/mL

## 2023-01-03 LAB — ESTRADIOL: Estradiol: 21.7 pg/mL

## 2023-01-12 ENCOUNTER — Encounter: Payer: Self-pay | Admitting: Physician Assistant

## 2023-01-13 MED ORDER — SCOPOLAMINE 1 MG/3DAYS TD PT72: 1.0000 | MEDICATED_PATCH | TRANSDERMAL | 0 refills | Status: DC

## 2023-01-13 NOTE — Telephone Encounter (Signed)
Please see message and advise 

## 2023-02-01 ENCOUNTER — Other Ambulatory Visit: Payer: Self-pay | Admitting: Hematology

## 2023-02-01 DIAGNOSIS — C50412 Malignant neoplasm of upper-outer quadrant of left female breast: Secondary | ICD-10-CM

## 2023-02-01 MED ORDER — ALPRAZOLAM 0.5 MG PO TABS: 0.5000 mg | ORAL_TABLET | Freq: Every evening | ORAL | 0 refills | Status: DC | PRN

## 2023-02-04 ENCOUNTER — Encounter: Payer: Self-pay | Admitting: Physician Assistant

## 2023-03-07 ENCOUNTER — Other Ambulatory Visit: Payer: Self-pay | Admitting: Hematology

## 2023-03-07 ENCOUNTER — Other Ambulatory Visit: Payer: Self-pay | Admitting: Physician Assistant

## 2023-03-07 DIAGNOSIS — C50412 Malignant neoplasm of upper-outer quadrant of left female breast: Secondary | ICD-10-CM

## 2023-03-10 ENCOUNTER — Other Ambulatory Visit: Payer: Self-pay | Admitting: Hematology

## 2023-03-10 DIAGNOSIS — Z170 Estrogen receptor positive status [ER+]: Secondary | ICD-10-CM

## 2023-03-10 MED ORDER — ALPRAZOLAM 0.5 MG PO TABS: 0.5000 mg | ORAL_TABLET | Freq: Every evening | ORAL | 0 refills | Status: DC | PRN

## 2023-04-02 ENCOUNTER — Encounter: Payer: Self-pay | Admitting: Physician Assistant

## 2023-04-05 ENCOUNTER — Encounter: Payer: Self-pay | Admitting: Physician Assistant

## 2023-04-05 ENCOUNTER — Telehealth (INDEPENDENT_AMBULATORY_CARE_PROVIDER_SITE_OTHER): Payer: BC Managed Care – PPO | Admitting: Physician Assistant

## 2023-04-05 DIAGNOSIS — R051 Acute cough: Secondary | ICD-10-CM | POA: Diagnosis not present

## 2023-04-05 DIAGNOSIS — I10 Essential (primary) hypertension: Secondary | ICD-10-CM

## 2023-04-05 NOTE — Progress Notes (Signed)
Virtual Visit via Video Note   I, Jarold Motto, connected with  Casey Baker  ([id number], 1970/11/06) on 04/05/23 at 11:20 AM EDT by a video-enabled telemedicine application and verified that I am speaking with the correct person using two identifiers.  Location: Patient: Home Provider: [facility] [place] office   I discussed the limitations of evaluation and management by telemedicine and the availability of in person appointments. The patient expressed understanding and agreed to proceed.    History of Present Illness: Casey Baker is a 52 y.o. who identifies as a female who was assigned female at birth, and is being seen today for upper respiratory infection (URI) and HTN.  Upper respiratory infection (URI) About two weeks ago patient reports that she developed flulike symptoms in her household, her daughter was diagnosed with a sinus infection She had gone on a work trip prior to symptoms starting and wondering if she brought something home Her COVID tests have been negative Denies: fever, chills, shortness of breath, chest pain  She is taking mucinex cough/cold, Flonase Cough can be worse at night because of postnasal drip Overall feels like she is improving and needs a work note today  HTN Currently taking lisinopril 40 mg daily. At home blood pressure readings are: Recently checked and was 140/90 today. Patient denies chest pain, SOB, blurred vision, dizziness, unusual headaches, lower leg swelling. Patient is compliant with medication. Denies excessive caffeine intake, stimulant usage, excessive alcohol intake, or increase in salt consumption.  BP Readings from Last 3 Encounters:  01/01/23 (!) 131/97  12/14/22 (!) 153/90  11/02/22 (!) 142/101     Problems:  Patient Active Problem List   Diagnosis Date Noted   Insulin resistance 12/17/2020   Breast cancer, left breast ([facility]) 03/18/2018   Genetic testing 02/21/2018   Family history of breast cancer     Family history of ovarian cancer    Iron deficiency anemia due to chronic blood loss 02/09/2018   Malignant neoplasm of upper-outer quadrant of left female breast ([facility]) 02/03/2018   Breast wound, left, subsequent encounter 01/12/2018   Hypertension    Anxiety with depression    Uterine fibroid 09/29/2014   Menorrhagia 09/29/2014   Anemia 09/29/2014   Sinus tachycardia     Allergies: No Known Allergies Medications:  Current Outpatient Medications:    ALPRAZolam (XANAX) 0.5 MG tablet, Take 1 tablet (0.5 mg total) by mouth at bedtime as needed for anxiety or sleep., Disp: 20 tablet, Rfl: 0   Blood Pressure Monitoring (BLOOD PRESSURE MONITOR AUTOMAT) DEVI, TO CHECK BLOOD PRESSURE TWICE DAILY AS DIRECTED, Disp: 1 each, Rfl: 0   brompheniramine-pseudoephedrine-DM 30-2-10 MG/5ML syrup, Take 5 mLs by mouth 4 (four) times daily as needed (cough)., Disp: 120 mL, Rfl: 0   Dextromethorphan-guaiFENesin (MUCINEX DM MAXIMUM STRENGTH) 60-1200 MG TB12, Take 1 tablet by mouth 2 (two) times daily., Disp: 20 tablet, Rfl: 0   fluconazole (DIFLUCAN) 150 MG tablet, Take 150 mg today, then 150 mg in 72 hours. Please take one more dose of 150 mg on the last day of your metronidazole for a total of 3 doses., Disp: 3 tablet, Rfl: 0   Lancets 33G MISC, USE TO CHECK BLOOD SUGAR DAILY AND PRN, Disp: 100 each, Rfl: 4   lisinopril (ZESTRIL) 40 MG tablet, TAKE 1 TABLET(40 MG) BY MOUTH DAILY, Disp: 90 tablet, Rfl: 0   methylPREDNISolone (MEDROL DOSEPAK) 4 MG TBPK tablet, Take as directed. Start on the morning of 11/03/22, Disp: 21 tablet, Rfl:  0   propranolol (INDERAL) 20 MG tablet, Take 1 tablet (20 mg total) by mouth 3 (three) times daily., Disp: 90 tablet, Rfl: 3   scopolamine (TRANSDERM-SCOP) 1 MG/3DAYS, Place 1 patch (1.5 mg total) onto the skin every 3 (three) days., Disp: 5 patch, Rfl: 0   tamoxifen (NOLVADEX) 10 MG tablet, Take 1 tablet (10 mg total) by mouth daily., Disp: 30 tablet, Rfl: 5   valACYclovir (VALTREX)  1000 MG tablet, Take two tablets ( total 2000 mg) by mouth q12h x 1 day; Start: ASAP after symptom onset, Disp: 30 tablet, Rfl: 1  Observations/Objective: Patient is well-developed, well-nourished in no acute distress.  Resting comfortably  at home.  Head is normocephalic, atraumatic.  No labored breathing.  Speech is clear and coherent with logical content.  Patient is alert and oriented at baseline.   Assessment and Plan: Primary hypertension Her reading at home today was borderline elevated No evidence of end-organ damage on my exam Recommend patient monitor home blood pressure at least a few times weekly Continue lisinopril 40 mg daily If home monitoring shows consistent elevation, or any symptom(s) develop, recommend reach out to [place] for further advice on next steps  I asked her to check her blood pressure every day this week and send me a MyChart message on Friday with her average readings so I can advise further  Acute cough No red flags on video exam or HPI.  Suspect that she had viral illness and does not meet criteria for antibiotics at this time.  I did recommend that she consider an over-the-counter antihistamine to help with her postnasal drip.  Discussed taking medications as prescribed. Reviewed return precautions including worsening fever, SOB, worsening cough or other concerns. Push fluids and rest. I recommend that patient follow-up if symptoms worsen or persist despite treatment x 7-10 days, sooner if needed.   Follow Up Instructions: I discussed the assessment and treatment plan with the patient. The patient was provided an opportunity to ask questions and all were answered. The patient agreed with the plan and demonstrated an understanding of the instructions.  A copy of instructions were sent to the patient via MyChart unless otherwise noted below.   The patient was advised to call back or seek an in-person evaluation if the symptoms worsen or if the condition fails to  improve as anticipated.  Jarold Motto, [place]

## 2023-04-12 ENCOUNTER — Other Ambulatory Visit: Payer: Self-pay | Admitting: Hematology

## 2023-04-12 DIAGNOSIS — C50412 Malignant neoplasm of upper-outer quadrant of left female breast: Secondary | ICD-10-CM

## 2023-04-13 MED ORDER — ALPRAZOLAM 0.5 MG PO TABS: 0.5000 mg | ORAL_TABLET | Freq: Every evening | ORAL | 0 refills | Status: DC | PRN

## 2023-04-15 ENCOUNTER — Telehealth: Payer: Self-pay | Admitting: Physician Assistant

## 2023-04-15 NOTE — Telephone Encounter (Signed)
Patient dropped off document FMLA, to be filled out by provider. Patient requested to send it back via Call Patient to pick up within 5-days. Document is located in providers tray at front office.Please advise at Mobile [phone] (mobile) ,informed pt ov may be required

## 2023-04-20 NOTE — Telephone Encounter (Signed)
Spoke to pt told her FMLA paperwork is completed and ready for pickup. Pt verbalized understanding and asked if there is a date on it to be back? Told her yes tomorrow.  I found a fax number do you want me to fax? Pt said yes, told her okay I will fax now and there will be a copy in your chart. Pt verbalized understanding.  FMLA faxed to [employer] at [phone].

## 2023-05-14 ENCOUNTER — Other Ambulatory Visit: Payer: Self-pay | Admitting: Nurse Practitioner

## 2023-05-14 DIAGNOSIS — C50412 Malignant neoplasm of upper-outer quadrant of left female breast: Secondary | ICD-10-CM

## 2023-05-24 ENCOUNTER — Other Ambulatory Visit: Payer: Self-pay | Admitting: Nurse Practitioner

## 2023-05-24 DIAGNOSIS — Z170 Estrogen receptor positive status [ER+]: Secondary | ICD-10-CM

## 2023-05-25 NOTE — Telephone Encounter (Signed)
Refilled by Clayborn Heron on 05/19/2023

## 2023-06-08 ENCOUNTER — Other Ambulatory Visit: Payer: Self-pay | Admitting: Physician Assistant

## 2023-07-02 ENCOUNTER — Other Ambulatory Visit: Payer: Self-pay

## 2023-07-02 DIAGNOSIS — Z170 Estrogen receptor positive status [ER+]: Secondary | ICD-10-CM

## 2023-07-04 NOTE — Assessment & Plan Note (Signed)
-  She will continue Xanax as needed.

## 2023-07-04 NOTE — Assessment & Plan Note (Signed)
invasive ductal carcinoma with DCIS, [id number], stage IA, G1, ER/PR positive, HER2 negative -Diagnosed on 01/2018. Treated with left total mastectomy and breast reconstruction and radiation. Currently on adjuvant Tamoxifen. -most recent right mammogram on 12/19/20 was negative. Next scheduled for tomorrow, 12/30/21. -I again discussed her switching to AI for an additional 5 years when she becomes post-menopausal. She would like to continue Tamoxifen. Because of her insomnia, we will try cutting this down to 10 mg. We will also check her hormone levels to ensure she is postmenopausal. -She is clinically doing well. Lab reviewed, her CBC and CMP are within normal limits. Her physical exam was unremarkable. There is no clinical concern for recurrence. -Continue surveillance.  -Continue Tamoxifen, due to her significant fatigue, will decrease dose to 10 mg daily. We will check hormone levels at next visit to see if she is truly postmenopausal and if we can switch to AI. -f/u in 6 months

## 2023-07-05 ENCOUNTER — Other Ambulatory Visit: Payer: Self-pay

## 2023-07-05 ENCOUNTER — Other Ambulatory Visit: Payer: Self-pay | Admitting: Hematology

## 2023-07-05 ENCOUNTER — Inpatient Hospital Stay (HOSPITAL_BASED_OUTPATIENT_CLINIC_OR_DEPARTMENT_OTHER): Payer: BC Managed Care – PPO | Admitting: Hematology

## 2023-07-05 ENCOUNTER — Inpatient Hospital Stay: Payer: BC Managed Care – PPO | Attending: Hematology

## 2023-07-05 ENCOUNTER — Encounter: Payer: Self-pay | Admitting: Hematology

## 2023-07-05 VITALS — BP 127/85 | HR 102 | Temp 98.6°F | Resp 18 | Ht 68.5 in | Wt 195.9 lb

## 2023-07-05 DIAGNOSIS — Z8041 Family history of malignant neoplasm of ovary: Secondary | ICD-10-CM | POA: Insufficient documentation

## 2023-07-05 DIAGNOSIS — F419 Anxiety disorder, unspecified: Secondary | ICD-10-CM | POA: Diagnosis not present

## 2023-07-05 DIAGNOSIS — Z803 Family history of malignant neoplasm of breast: Secondary | ICD-10-CM | POA: Insufficient documentation

## 2023-07-05 DIAGNOSIS — Z79810 Long term (current) use of selective estrogen receptor modulators (SERMs): Secondary | ICD-10-CM | POA: Diagnosis not present

## 2023-07-05 DIAGNOSIS — C50412 Malignant neoplasm of upper-outer quadrant of left female breast: Secondary | ICD-10-CM

## 2023-07-05 DIAGNOSIS — Z170 Estrogen receptor positive status [ER+]: Secondary | ICD-10-CM | POA: Diagnosis not present

## 2023-07-05 DIAGNOSIS — F32A Depression, unspecified: Secondary | ICD-10-CM | POA: Insufficient documentation

## 2023-07-05 DIAGNOSIS — F418 Other specified anxiety disorders: Secondary | ICD-10-CM

## 2023-07-05 DIAGNOSIS — Z79899 Other long term (current) drug therapy: Secondary | ICD-10-CM | POA: Diagnosis not present

## 2023-07-05 LAB — CBC WITH DIFFERENTIAL (CANCER CENTER ONLY)
Abs Immature Granulocytes: 0.01 10*3/uL (ref 0.00–0.07)
Basophils Absolute: 0 10*3/uL (ref 0.0–0.1)
Basophils Relative: 1 %
Eosinophils Absolute: 0.1 10*3/uL (ref 0.0–0.5)
Eosinophils Relative: 2 %
HCT: 41 % (ref 36.0–46.0)
Hemoglobin: 14 g/dL (ref 12.0–15.0)
Immature Granulocytes: 0 %
Lymphocytes Relative: 41 %
Lymphs Abs: 1.5 10*3/uL (ref 0.7–4.0)
MCH: 29.6 pg (ref 26.0–34.0)
MCHC: 34.1 g/dL (ref 30.0–36.0)
MCV: 86.7 fL (ref 80.0–100.0)
Monocytes Absolute: 0.5 10*3/uL (ref 0.1–1.0)
Monocytes Relative: 14 %
Neutro Abs: 1.5 10*3/uL — ABNORMAL LOW (ref 1.7–7.7)
Neutrophils Relative %: 42 %
Platelet Count: 270 10*3/uL (ref 150–400)
RBC: 4.73 MIL/uL (ref 3.87–5.11)
RDW: 13.7 % (ref 11.5–15.5)
WBC Count: 3.6 10*3/uL — ABNORMAL LOW (ref 4.0–10.5)
nRBC: 0 % (ref 0.0–0.2)

## 2023-07-05 LAB — CMP (CANCER CENTER ONLY)
ALT: 10 U/L (ref 0–44)
AST: 16 U/L (ref 15–41)
Albumin: 4.1 g/dL (ref 3.5–5.0)
Alkaline Phosphatase: 66 U/L (ref 38–126)
Anion gap: 6 (ref 5–15)
BUN: 10 mg/dL (ref 6–20)
CO2: 26 mmol/L (ref 22–32)
Calcium: 9.2 mg/dL (ref 8.9–10.3)
Chloride: 107 mmol/L (ref 98–111)
Creatinine: 0.92 mg/dL (ref 0.44–1.00)
GFR, Estimated: >60 mL/min (ref >60)
Glucose, Bld: 102 mg/dL — ABNORMAL HIGH (ref 70–99)
Potassium: 4.2 mmol/L (ref 3.5–5.1)
Sodium: 139 mmol/L (ref 135–145)
Total Bilirubin: 0.4 mg/dL (ref 0.3–1.2)
Total Protein: 7.3 g/dL (ref 6.5–8.1)

## 2023-07-05 MED ORDER — VEOZAH 45 MG PO TABS: 45.0000 mg | ORAL_TABLET | Freq: Every day | ORAL | 3 refills | Status: DC

## 2023-07-05 MED ORDER — TAMOXIFEN CITRATE 10 MG PO TABS: 10.0000 mg | ORAL_TABLET | Freq: Every day | ORAL | 5 refills | Status: DC

## 2023-07-05 MED ORDER — ESZOPICLONE 1 MG PO TABS: 1.0000 mg | ORAL_TABLET | Freq: Every evening | ORAL | 0 refills | Status: DC | PRN

## 2023-07-05 NOTE — Progress Notes (Signed)
[facility]   [phone] [phone]   Clinic Follow up Note   Patient Care Team: Jarold Motto, [place] as PCP - General (Physician Assistant) Glenna Fellows, MD (Inactive) as Consulting Physician (General Surgery) Malachy Mood, MD as Consulting Physician (Hematology) Antony Blackbird, MD as Consulting Physician (Radiation Oncology) Pollyann Samples, NP as Nurse Practitioner (Nurse Practitioner)  Date of Service:  07/05/2023  CHIEF COMPLAINT: f/u of left breast DCIS   CURRENT THERAPY:   Adjuvant Tamoxifen, started in early Oct 2019   ASSESSMENT:  Casey Baker is a 52 y.o. 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 Treated with left total mastectomy and breast reconstruction and radiation. Currently on adjuvant Tamoxifen. -most recent right mammogram on 12/19/20 was negative. Next scheduled for tomorrow, 12/30/21. -I again discussed her switching to AI for an additional 5 years when she becomes post-menopausal. She would like to continue Tamoxifen. Because of her insomnia, we will try cutting this down to 10 mg. We will also check her hormone levels to ensure she is postmenopausal. -She is clinically doing well. Lab reviewed, her CBC and CMP are within normal limits. Her physical exam was unremarkable. There is no clinical concern for recurrence. -Continue surveillance.  -Continue Tamoxifen, due to her significant fatigue and hot flushes, I have reduced her dose to 10 mg daily.  She is tolerating better, but still has hot flashes.  We ordered Veozah for her hot flushes today.  Due to overall poor tolerance, I will stop her tamoxifen after 7 years. -f/u in 6 months    Anxiety with depression -She uses Xanax as needed in the past for anxiety and insomnia -Due to the drowsiness from Xanax, she wanted  to switch Xanax to [facility] for insomnia.  I ordered for her today       PLAN: - I Prescribe veozah 45 MG for hot flash -I prescribe Lunesta 1-2 mg for sleep -I refill Tamoxifen 10mg  daily  -lab reviewed -CMP-reviewed -lab and f/u in 6 month    SUMMARY OF ONCOLOGIC HISTORY: Oncology History Overview Note  Cancer Staging Ductal carcinoma in situ (DCIS) of left breast Staging form: Breast, AJCC 8th Edition - Clinical stage from 02/09/2018: Stage 0 (cTis (DCIS), cN0, cM0, ER+, PR+) - Unsigned - Pathologic stage from 03/18/2018: Stage IA (pT26mi, pN1a, cM0, G1, ER+, PR+, HER2-) - Signed by Malachy Mood, MD on 03/28/2018     Malignant neoplasm of upper-outer quadrant of left female breast (HCC)  01/27/2018 Mammogram   IMPRESSION: Suspicious bloody left nipple discharge with palpable masses in the 2 o'clock axis of the left breast. Findings are suspicious for Malignancy. ADDENDUM: Magnification views of the retroareolar right breast were performed to evaluate calcifications seen on the patient's baseline mammogram. There are loosely grouped and scattered calcifications in the outer right breast, superior to and directly posterior to the nipple. These calcifications are rounded and smudgy in the CC projection, and many of them demonstrate layering on the 90 degree lateral magnification view, consistent with benign milk of calcium. No suspicious microcalcifications are seen in the right breast.   01/31/2018 Initial Biopsy   Diagnosis 01/31/18 1. 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 - NEGATIVE FOR INVASIVE CARCINOMA. 2. Breast, left, needle core biopsy, 2 o'clock, 10 cfn -  LOW GRADE DUCTAL CARCINOMA IN SITU (DCIS) PARTIALLY INVOLVING AN INTRADUCTAL PAPILLOMA. - NEGATIVE FOR INVASIVE CARCINOMA.   01/31/2018 Receptors her2   Prognostic indicators significant for: ER, 100% positive and  PR, 100% positive, both with strong staining intensity.    02/03/2018 Initial Diagnosis   Ductal carcinoma in situ (DCIS) of left breast   02/10/2018 Genetic Testing   Testing did not reveal a pathogenic mutation in any of the genes analyzed. A copy of the genetic test report will be scanned into Epic under the Media tab.  The genes analyzed were the 23 genes on [facility]'s Breast/GYN panel (ATM, BARD1, BRCA1, BRCA2, BRIP1, CDH1, CHEK2, DICER1, EPCAM, MLH1,  MSH2, MSH6, NBN, NF1, PALB2, PMS2, PTEN, RAD50, RAD51C, RAD51D,SMARCA4, STK11, and TP53).  Genetic testing involved analysis of 9 genes: ATM, BRCA1, BRCA2, CDH1, CHEK2, PALB2, PTEN, STK11 and TP53 genes. Testing was normal and did not reveal a mutation in these genes.     03/18/2018 Surgery   TOTAL LEFT MASTECTOMY WITH AXILLARY SENTINEL LYMPH NODE BIOPSY and  LEFT BREAST RECONSTRUCTION WITH PLACEMENT OF TISSUE EXPANDER AND FLEX HD (ACELLULAR HYDRATED DERMIS) by Dr. Johna Sheriff and Dr. Leta Baptist  03/18/18   03/18/2018 Pathology Results   Diagnosis 03/18/18 1. Lymph node, sentinel, biopsy, Left Axillary #1 - METASTATIC CARCINOMA IN ONE OF ONE LYMPH NODES (1/1). 2. Lymph node, sentinel, biopsy, Left Axillary #2 - ONE OF ONE LYMPH NODES NEGATIVE FOR CARCINOMA (0/1). 3. Breast, simple mastectomy, Left Total - MICROINVASIVE DUCTAL CARCINOMA ARISING IN A BACKGROUND OF LOW GRADE DUCTAL CARCINOMA IN SITU. - DUCTAL CARCINOMA IN SITU PARTIALLY INVOLVES AN INTRADUCTAL PAPILLOMA. - RESECTION MARGINS ARE NEGATIVE FOR INVASIVE CARCINOMA. - IN SITU CARCINOMA IS PRESENT AT THE ANTERIOR MARGIN BROADLY. - BIOPSY SITE. - SEE ONCOLOGY TABLE.    03/18/2018 Receptors her2   Estrogen Receptor: 100%, POSITIVE, MODERATE-WEAK STAINING INTENSITY Progesterone Receptor: 100%, POSITIVE, STRONG STAINING INTENSITY Proliferation Marker Ki67: 1% HER2 - NEGATIVE   03/18/2018 Cancer Staging   Staging form: Breast, AJCC 8th Edition - Pathologic stage from 03/18/2018:  Stage IA (pT44mi, pN1a, cM0, G1, ER+, PR+, HER2-) - Signed by Malachy Mood, MD on 03/28/2018   05/18/2018 - 06/27/2018 Radiation Therapy   Adjuvant radiation by Dr. Roselind Messier    06/27/2018 Mammogram   06/27/2018 Mammogram IMPRESSION: Benign appearing right breast calcifications.   Recommendation: Screening right mammogram is suggested in April 2020.   07/19/2018 -  Anti-estrogen oral therapy   Adjuvant tamoxifen     Survivorship   Per Santiago Glad, NP    03/07/2019 Surgery   REMOVAL OF LEFT TISSUE EXPANDER AND PLACEMENT OF SILICONE IMPLANT and LIPOFILLING FROM ABDOMEN TO LEFT CHEST and RIGHT BREAST MASTOPEXY by Dr. Leta Baptist  03/07/19   06/14/2020 Surgery   REVISION RIGHT BREAST REDUCTION and LIPOSUCTION BILATERAL CHEST WALL AND ABDOMEN; LIPOFILLING LEFT CHEST by Dr Leta Baptist       INTERVAL HISTORY:  Audria Nine is here for a follow up of left breast DCIS. She was last seen by me on 01/01/2023. She presents to the clinic alone. Pt is still taking Tamoxifen. She took three week break from Tamoxifen, due to the side effect but she has started back. She states that the hot flashes are unbearable. She also state that she still hasn't been sleeping well at time. Pt state that she has some vaginal dryness due to side effects from Tamoxifen.     All other systems were reviewed with the patient and are negative.  MEDICAL HISTORY:  Past Medical  History:  Diagnosis Date   Anemia    required iron transfusions while having periods   Anxiety    Breast cancer (HCC) 2019   left breast   Ductal carcinoma in situ (DCIS) of left breast 02/03/2018   Family history of breast cancer    Family history of ovarian cancer    Genetic testing 02/21/2018   STAT Breast panel with reflex to Breast/GYN panel (23 genes) @ [facility] - No pathogenic mutations detected   Hypertension    PONV (postoperative nausea and vomiting)    Uterine fibroid    Vaginal delivery 1992, 1994, 1997    SURGICAL HISTORY: Past  Surgical History:  Procedure Laterality Date   AXILLARY SURGERY     BREAST RECONSTRUCTION WITH PLACEMENT OF TISSUE EXPANDER AND FLEX HD (ACELLULAR HYDRATED DERMIS) Left 03/18/2018   Procedure: LEFT BREAST RECONSTRUCTION WITH PLACEMENT OF TISSUE EXPANDER AND FLEX HD (ACELLULAR HYDRATED DERMIS);  Surgeon: Glenna Fellows, MD;  Location: [facility];  Service: Plastics;  Laterality: Left;   BREAST REDUCTION SURGERY Right 06/14/2020   Procedure: REVISION RIGHT BREAST REDUCTION;  Surgeon: Glenna Fellows, MD;  Location: [facility];  Service: Plastics;  Laterality: Right;   DENTAL SURGERY N/A 07/08/2018   KNEE ARTHROSCOPY Left 2013   LIPOSUCTION WITH LIPOFILLING Left 03/07/2019   Procedure: LIPOFILLING FROM ABDOMEN TO LEFT CHEST;  Surgeon: Glenna Fellows, MD;  Location: [facility];  Service: Plastics;  Laterality: Left;   LIPOSUCTION WITH LIPOFILLING Bilateral 06/14/2020   Procedure: LIPOSUCTION BILATERAL CHEST WALL AND ABDOMEN; LIPOFILLING LEFT CHEST;  Surgeon: Glenna Fellows, MD;  Location: [facility];  Service: Plastics;  Laterality: Bilateral;   MASTECTOMY Left 2019/5   MASTECTOMY W/ SENTINEL NODE BIOPSY Left 03/18/2018   Procedure: TOTAL LEFT MASTECTOMY WITH AXILLARY SENTINEL LYMPH NODE BIOPSY;  Surgeon: Glenna Fellows, MD;  Location: [facility];  Service: General;  Laterality: Left;   MASTOPEXY Right 03/07/2019   Procedure: RIGHT BREAST MASTOPEXY;  Surgeon: Glenna Fellows, MD;  Location: [facility];  Service: Plastics;  Laterality: Right;   REDUCTION MAMMAPLASTY Right 02/2019   REMOVAL OF TISSUE EXPANDER AND PLACEMENT OF IMPLANT Left 03/07/2019   Procedure: REMOVAL OF LEFT TISSUE EXPANDER AND PLACEMENT OF SILICONE IMPLANT;  Surgeon: Glenna Fellows, MD;  Location:  SURGERY CENTER;  Service: Plastics;  Laterality: Left;   TOOTH EXTRACTION N/A 07/08/2018   Procedure: DENTAL  RESTORATION/EXTRACTIONS;  Surgeon: Ocie Doyne, DDS;  Location: [facility];  Service: Oral Surgery;  Laterality: N/A;   TUBAL LIGATION      I have reviewed the social history and family history with the patient and they are unchanged from previous note.  ALLERGIES:  has No Known Allergies.  MEDICATIONS:  Current Outpatient Medications  Medication Sig Dispense Refill   eszopiclone (LUNESTA) 1 MG TABS tablet Take 1-2 tablets (1-2 mg total) by mouth at bedtime as needed for sleep. Take immediately before bedtime 60 tablet 0   Blood Pressure Monitoring (BLOOD PRESSURE MONITOR AUTOMAT) DEVI TO CHECK BLOOD PRESSURE TWICE DAILY AS DIRECTED 1 each 0   brompheniramine-pseudoephedrine-DM 30-2-10 MG/5ML syrup Take 5 mLs by mouth 4 (four) times daily as needed (cough). 120 mL 0   Dextromethorphan-guaiFENesin (MUCINEX DM MAXIMUM STRENGTH) 60-1200 MG TB12 Take 1 tablet by mouth 2 (two) times daily. 20 tablet 0   Fezolinetant (VEOZAH) 45 MG TABS Take 1 tablet (45 mg total) by mouth daily. 30 tablet 3   fluconazole (DIFLUCAN) 150 MG tablet Take  150 mg today, then 150 mg in 72 hours. Please take one more dose of 150 mg on the last day of your metronidazole for a total of 3 doses. 3 tablet 0   Lancets 33G MISC USE TO CHECK BLOOD SUGAR DAILY AND PRN 100 each 4   lisinopril (ZESTRIL) 40 MG tablet TAKE 1 TABLET(40 MG) BY MOUTH DAILY 90 tablet 0   methylPREDNISolone (MEDROL DOSEPAK) 4 MG TBPK tablet Take as directed. Start on the morning of 11/03/22 21 tablet 0   propranolol (INDERAL) 20 MG tablet Take 1 tablet (20 mg total) by mouth 3 (three) times daily. 90 tablet 3   scopolamine (TRANSDERM-SCOP) 1 MG/3DAYS Place 1 patch (1.5 mg total) onto the skin every 3 (three) days. 5 patch 0   tamoxifen (NOLVADEX) 10 MG tablet Take 1 tablet (10 mg total) by mouth daily. 30 tablet 5   valACYclovir (VALTREX) 1000 MG tablet Take two tablets ( total 2000 mg) by mouth q12h x 1 day; Start: ASAP after symptom onset 30 tablet 1   No  current facility-administered medications for this visit.    PHYSICAL EXAMINATION: ECOG PERFORMANCE STATUS: 0 - Asymptomatic  Vitals:   07/05/23 1132  BP: 127/85  Pulse: (!) 102  Resp: 18  Temp: 98.6 F (37 C)  SpO2: 99%   Wt Readings from Last 3 Encounters:  07/05/23 195 lb 14.4 oz (88.9 kg)  01/01/23 189 lb 4.8 oz (85.9 kg)  03/31/22 180 lb (81.6 kg)     GENERAL:alert, no distress and comfortable SKIN: skin color normal, no rashes or significant lesions EYES: normal, Conjunctiva are pink and non-injected, sclera clear  NEURO: alert & oriented x 3 with fluent speech NECK:(-) supple, thyroid normal size, non-tender, without nodularity LYMPH: (-) no palpable lymphadenopathy in the cervical, axillary  ABDOMEN:(-)abdomen soft, (-)non-tender and normal bowel sounds Breast: Rt breast no palpable mass breast exam benign. LT breast Mastectomy no palpable mass , breast exam benign. LABORATORY DATA:  I have reviewed the data as listed    Latest Ref Rng & Units 07/05/2023   10:42 AM 01/01/2023   10:27 AM 06/26/2022    9:15 AM  CBC  WBC 4.0 - 10.5 K/uL 3.6  3.7  4.1   Hemoglobin 12.0 - 15.0 g/dL 40.9  81.1  91.4   Hematocrit 36.0 - 46.0 % 41.0  40.4  39.8   Platelets 150 - 400 K/uL 270  277  230         Latest Ref Rng & Units 07/05/2023   10:42 AM 01/01/2023   10:27 AM 06/26/2022    9:15 AM  CMP  Glucose 70 - 99 mg/dL 782  956  98   BUN 6 - 20 mg/dL 10  14  13    Creatinine 0.44 - 1.00 mg/dL 2.13  0.86  5.78   Sodium 135 - 145 mmol/L 139  141  142   Potassium 3.5 - 5.1 mmol/L 4.2  4.0  4.0   Chloride 98 - 111 mmol/L 107  107  108   CO2 22 - 32 mmol/L 26  26  27    Calcium 8.9 - 10.3 mg/dL 9.2  9.4  9.2   Total Protein 6.5 - 8.1 g/dL 7.3  7.5  7.1   Total Bilirubin 0.3 - 1.2 mg/dL 0.4  0.5  0.5   Alkaline Phos 38 - 126 U/L 66  57  50   AST 15 - 41 U/L 16  14  12    ALT 0 -  44 U/L 10  10  7        RADIOGRAPHIC STUDIES: I have personally reviewed the radiological images as  listed and agreed with the findings in the report. No results found.    No orders of the defined types were placed in this encounter.  All questions were answered. The patient knows to call the clinic with any problems, questions or concerns. No barriers to learning was detected. The total time spent in the appointment was 30 minutes.     Malachy Mood, MD 07/05/2023   Carolin Coy, CMA, am acting as scribe for Malachy Mood, MD.   I have reviewed the above documentation for accuracy and completeness, and I agree with the above.

## 2023-07-06 ENCOUNTER — Other Ambulatory Visit: Payer: Self-pay

## 2023-07-06 LAB — ESTRADIOL: Estradiol: 19 pg/mL

## 2023-07-06 LAB — FOLLICLE STIMULATING HORMONE: FSH: 10.8 m[IU]/mL

## 2023-07-12 ENCOUNTER — Telehealth: Payer: BC Managed Care – PPO | Admitting: Physician Assistant

## 2023-07-12 DIAGNOSIS — J019 Acute sinusitis, unspecified: Secondary | ICD-10-CM | POA: Diagnosis not present

## 2023-07-12 DIAGNOSIS — B9689 Other specified bacterial agents as the cause of diseases classified elsewhere: Secondary | ICD-10-CM | POA: Diagnosis not present

## 2023-07-12 MED ORDER — AMOXICILLIN-POT CLAVULANATE 875-125 MG PO TABS: 1.0000 | ORAL_TABLET | Freq: Two times a day (BID) | ORAL | 0 refills | Status: DC

## 2023-07-12 MED ORDER — IPRATROPIUM BROMIDE 0.03 % NA SOLN: 2.0000 | Freq: Two times a day (BID) | NASAL | 0 refills | Status: DC

## 2023-07-12 NOTE — Patient Instructions (Signed)
Oletta Cohn Verner Mould, thank you for joining Margaretann Loveless, PA-C for today's virtual visit.  While this provider is not your primary care provider (PCP), if your PCP is located in our provider database this encounter information will be shared with them immediately following your visit.   A [facility] MyChart account gives you access to today's visit and all your visits, tests, and labs performed at [facility] " click here if you don't have a [facility] MyChart account or go to [email or web address]  Consent: (Patient) Audria Nine provided verbal consent for this virtual visit at the beginning of the encounter.  Current Medications:  Current Outpatient Medications:    amoxicillin-clavulanate (AUGMENTIN) 875-125 MG tablet, Take 1 tablet by mouth 2 (two) times daily., Disp: 14 tablet, Rfl: 0   ipratropium (ATROVENT) 0.03 % nasal spray, Place 2 sprays into both nostrils every 12 (twelve) hours., Disp: 30 mL, Rfl: 0   Blood Pressure Monitoring (BLOOD PRESSURE MONITOR AUTOMAT) DEVI, TO CHECK BLOOD PRESSURE TWICE DAILY AS DIRECTED, Disp: 1 each, Rfl: 0   brompheniramine-pseudoephedrine-DM 30-2-10 MG/5ML syrup, Take 5 mLs by mouth 4 (four) times daily as needed (cough)., Disp: 120 mL, Rfl: 0   Dextromethorphan-guaiFENesin (MUCINEX DM MAXIMUM STRENGTH) 60-1200 MG TB12, Take 1 tablet by mouth 2 (two) times daily., Disp: 20 tablet, Rfl: 0   eszopiclone (LUNESTA) 1 MG TABS tablet, Take 1-2 tablets (1-2 mg total) by mouth at bedtime as needed for sleep. Take immediately before bedtime, Disp: 60 tablet, Rfl: 0   fluconazole (DIFLUCAN) 150 MG tablet, Take 150 mg today, then 150 mg in 72 hours. Please take one more dose of 150 mg on the last day of your metronidazole for a total of 3 doses., Disp: 3 tablet, Rfl: 0   Lancets 33G MISC, USE TO CHECK BLOOD SUGAR DAILY AND PRN, Disp: 100 each, Rfl: 4   lisinopril (ZESTRIL) 40 MG tablet, TAKE 1 TABLET(40 MG) BY MOUTH DAILY, Disp: 90 tablet,  Rfl: 0   methylPREDNISolone (MEDROL DOSEPAK) 4 MG TBPK tablet, Take as directed. Start on the morning of 11/03/22, Disp: 21 tablet, Rfl: 0   propranolol (INDERAL) 20 MG tablet, Take 1 tablet (20 mg total) by mouth 3 (three) times daily., Disp: 90 tablet, Rfl: 3   scopolamine (TRANSDERM-SCOP) 1 MG/3DAYS, Place 1 patch (1.5 mg total) onto the skin every 3 (three) days., Disp: 5 patch, Rfl: 0   tamoxifen (NOLVADEX) 10 MG tablet, Take 1 tablet (10 mg total) by mouth daily., Disp: 30 tablet, Rfl: 5   valACYclovir (VALTREX) 1000 MG tablet, Take two tablets ( total 2000 mg) by mouth q12h x 1 day; Start: ASAP after symptom onset, Disp: 30 tablet, Rfl: 1   VEOZAH 45 MG TABS, TAKE 1 TABLET BY MOUTH EVERY DAY, Disp: 30 tablet, Rfl: 3   Medications ordered in this encounter:  Meds ordered this encounter  Medications   amoxicillin-clavulanate (AUGMENTIN) 875-125 MG tablet    Sig: Take 1 tablet by mouth 2 (two) times daily.    Dispense:  14 tablet    Refill:  0    Order Specific Question:   Supervising Provider    Answer:   Merrilee Jansky [[id number]]   ipratropium (ATROVENT) 0.03 % nasal spray    Sig: Place 2 sprays into both nostrils every 12 (twelve) hours.    Dispense:  30 mL    Refill:  0    Order Specific Question:   Supervising Provider    Answer:   Leonides Grills, PHILIP  [facility] [place] [[id number]]     *If you need refills on other medications prior to your next appointment, please contact your pharmacy*  Follow-Up: Call back or seek an in-person evaluation if the symptoms worsen or if the condition fails to improve as anticipated.  [facility] [phone]  Other Instructions Sinus Infection, Adult A sinus infection, also called sinusitis, is inflammation of your sinuses. Sinuses are hollow spaces in the bones around your face. Your sinuses are located: Around your eyes. In the middle of your forehead. Behind your nose. In your cheekbones. Mucus normally drains out of your sinuses. When your  nasal tissues become inflamed or swollen, mucus can become trapped or blocked. This allows bacteria, viruses, and fungi to grow, which leads to infection. Most infections of the sinuses are caused by a virus. A sinus infection can develop quickly. It can last for up to 4 weeks (acute) or for more than 12 weeks (chronic). A sinus infection often develops after a cold. What are the causes? This condition is caused by anything that creates swelling in the sinuses or stops mucus from draining. This includes: Allergies. Asthma. Infection from bacteria or viruses. Deformities or blockages in your nose or sinuses. Abnormal growths in the nose (nasal polyps). Pollutants, such as chemicals or irritants in the air. Infection from fungi. This is rare. What increases the risk? You are more likely to develop this condition if you: Have a weak body defense system (immune system). Do a lot of swimming or diving. Overuse nasal sprays. Smoke. What are the signs or symptoms? The main symptoms of this condition are pain and a feeling of pressure around the affected sinuses. Other symptoms include: Stuffy nose or congestion that makes it difficult to breathe through your nose. Thick yellow or greenish drainage from your nose. Tenderness, swelling, and warmth over the affected sinuses. A cough that may get worse at night. Decreased sense of smell and taste. Extra mucus that collects in the throat or the back of the nose (postnasal drip) causing a sore throat or bad breath. Tiredness (fatigue). Fever. How is this diagnosed? This condition is diagnosed based on: Your symptoms. Your medical history. A physical exam. Tests to find out if your condition is acute or chronic. This may include: Checking your nose for nasal polyps. Viewing your sinuses using a device that has a light (endoscope). Testing for allergies or bacteria. Imaging tests, such as an MRI or CT scan. In rare cases, a bone biopsy may be  done to rule out more serious types of fungal sinus disease. How is this treated? Treatment for a sinus infection depends on the cause and whether your condition is chronic or acute. If caused by a virus, your symptoms should go away on their own within 10 days. You may be given medicines to relieve symptoms. They include: Medicines that shrink swollen nasal passages (decongestants). A spray that eases inflammation of the nostrils (topical intranasal corticosteroids). Rinses that help get rid of thick mucus in your nose (nasal saline washes). Medicines that treat allergies (antihistamines). Over-the-counter pain relievers. If caused by bacteria, your health care provider may recommend waiting to see if your symptoms improve. Most bacterial infections will get better without antibiotic medicine. You may be given antibiotics if you have: A severe infection. A weak immune system. If caused by narrow nasal passages or nasal polyps, surgery may be needed. Follow these instructions at home: Medicines Take, use, or apply over-the-counter and prescription medicines only as told by  your health care provider. These may include nasal sprays. If you were prescribed an antibiotic medicine, take it as told by your health care provider. Do not stop taking the antibiotic even if you start to feel better. Hydrate and humidify  Drink enough fluid to keep your urine pale yellow. Staying hydrated will help to thin your mucus. Use a cool mist humidifier to keep the humidity level in your home above 50%. Inhale steam for 10-15 minutes, 3-4 times a day, or as told by your health care provider. You can do this in the bathroom while a hot shower is running. Limit your exposure to cool or dry air. Rest Rest as much as possible. Sleep with your head raised (elevated). Make sure you get enough sleep each night. General instructions  Apply a warm, moist washcloth to your face 3-4 times a day or as told by your  health care provider. This will help with discomfort. Use nasal saline washes as often as told by your health care provider. Wash your hands often with soap and water to reduce your exposure to germs. If soap and water are not available, use hand sanitizer. Do not smoke. Avoid being around people who are smoking (secondhand smoke). Keep all follow-up visits. This is important. Contact a health care provider if: You have a fever. Your symptoms get worse. Your symptoms do not improve within 10 days. Get help right away if: You have a severe headache. You have persistent vomiting. You have severe pain or swelling around your face or eyes. You have vision problems. You develop confusion. Your neck is stiff. You have trouble breathing. These symptoms may be an emergency. Get help right away. Call 911. Do not wait to see if the symptoms will go away. Do not drive yourself to the hospital. Summary A sinus infection is soreness and inflammation of your sinuses. Sinuses are hollow spaces in the bones around your face. This condition is caused by nasal tissues that become inflamed or swollen. The swelling traps or blocks the flow of mucus. This allows bacteria, viruses, and fungi to grow, which leads to infection. If you were prescribed an antibiotic medicine, take it as told by your health care provider. Do not stop taking the antibiotic even if you start to feel better. Keep all follow-up visits. This is important. This information is not intended to replace advice given to you by your health care provider. Make sure you discuss any questions you have with your health care provider. Document Revised: 09/09/2021 Document Reviewed: 09/09/2021 Elsevier Patient Education  2024 Elsevier Inc.    If you have been instructed to have an in-person evaluation today at a local Urgent Care facility, please use the link below. It will take you to a list of all of our available [facility],  including address, phone number and hours of operation. Please do not delay care.  [facility]  If you or a family member do not have a primary care provider, use the link below to schedule a visit and establish care. When you choose a [facility] primary care physician or advanced practice provider, you gain a long-term partner in health. Find a Primary Care Provider  Learn more about [facility]'s in-office and virtual care options: [facility] - Get Care Now

## 2023-07-12 NOTE — Progress Notes (Signed)
Virtual Visit Consent   Casey Baker, you are scheduled for a virtual visit with a [facility] provider today. Just as with appointments in the office, your consent must be obtained to participate. Your consent will be active for this visit and any virtual visit you may have with one of our providers in the next 365 days. If you have a MyChart account, a copy of this consent can be sent to you electronically.  As this is a virtual visit, video technology does not allow for your provider to perform a traditional examination. This may limit your provider's ability to fully assess your condition. If your provider identifies any concerns that need to be evaluated in person or the need to arrange testing (such as labs, EKG, etc.), we will make arrangements to do so. Although advances in technology are sophisticated, we cannot ensure that it will always work on either your end or our end. If the connection with a video visit is poor, the visit may have to be switched to a telephone visit. With either a video or telephone visit, we are not always able to ensure that we have a secure connection.  By engaging in this virtual visit, you consent to the provision of healthcare and authorize for your insurance to be billed (if applicable) for the services provided during this visit. Depending on your insurance coverage, you may receive a charge related to this service.  I need to obtain your verbal consent now. Are you willing to proceed with your visit today? BRECCA TRIVETTE has provided verbal consent on 07/12/2023 for a virtual visit (video or telephone). Margaretann Loveless, PA-C  Date: 07/12/2023 6:33 PM  Virtual Visit via Video Note   I, Margaretann Loveless, connected with  Casey Baker  ([id number], [birth year]) on 07/12/23 at  6:30 PM EDT by a video-enabled telemedicine application and verified that I am speaking with the correct person using two identifiers.  Location: Patient: Virtual Visit  Location Patient: Home Provider: Virtual Visit Location Provider: Home Office   I discussed the limitations of evaluation and management by telemedicine and the availability of in person appointments. The patient expressed understanding and agreed to proceed.    History of Present Illness: [name] is a 52 y.o. who identifies as a female who was assigned female at birth, and is being seen today for URI symptoms.  HPI: URI  This is a new problem. The current episode started in the past 7 days. The problem has been gradually worsening. There has been no fever. Associated symptoms include congestion, coughing, headaches, rhinorrhea (and post nasal drainage), sinus pain, sneezing and a sore throat. Pertinent negatives include no ear pain or plugged ear sensation. Associated symptoms comments: chills. Treatments tried: mucinex. The treatment provided mild relief.   Negative at home Covid 19 testing.   Problems:  Patient Active Problem List   Diagnosis Date Noted   Insulin resistance 12/17/2020   Breast cancer, left breast (HCC) 03/18/2018   Genetic testing 02/21/2018   Family history of breast cancer    Family history of ovarian cancer    Iron deficiency anemia due to chronic blood loss 02/09/2018   Malignant neoplasm of upper-outer quadrant of left female breast (HCC) 02/03/2018   Breast wound, left, subsequent encounter 01/12/2018   Hypertension    Anxiety with depression    Uterine fibroid 09/29/2014   Menorrhagia 09/29/2014   Anemia 09/29/2014   Sinus tachycardia     Allergies: No Known  Allergies Medications:  Current Outpatient Medications:    amoxicillin-clavulanate (AUGMENTIN) 875-125 MG tablet, Take 1 tablet by mouth 2 (two) times daily., Disp: 14 tablet, Rfl: 0   ipratropium (ATROVENT) 0.03 % nasal spray, Place 2 sprays into both nostrils every 12 (twelve) hours., Disp: 30 mL, Rfl: 0   Blood Pressure Monitoring (BLOOD PRESSURE MONITOR AUTOMAT) DEVI, TO CHECK BLOOD  PRESSURE TWICE DAILY AS DIRECTED, Disp: 1 each, Rfl: 0   brompheniramine-pseudoephedrine-DM 30-2-10 MG/5ML syrup, Take 5 mLs by mouth 4 (four) times daily as needed (cough)., Disp: 120 mL, Rfl: 0   Dextromethorphan-guaiFENesin (MUCINEX DM MAXIMUM STRENGTH) 60-1200 MG TB12, Take 1 tablet by mouth 2 (two) times daily., Disp: 20 tablet, Rfl: 0   eszopiclone (LUNESTA) 1 MG TABS tablet, Take 1-2 tablets (1-2 mg total) by mouth at bedtime as needed for sleep. Take immediately before bedtime, Disp: 60 tablet, Rfl: 0   fluconazole (DIFLUCAN) 150 MG tablet, Take 150 mg today, then 150 mg in 72 hours. Please take one more dose of 150 mg on the last day of your metronidazole for a total of 3 doses., Disp: 3 tablet, Rfl: 0   Lancets 33G MISC, USE TO CHECK BLOOD SUGAR DAILY AND PRN, Disp: 100 each, Rfl: 4   lisinopril (ZESTRIL) 40 MG tablet, TAKE 1 TABLET(40 MG) BY MOUTH DAILY, Disp: 90 tablet, Rfl: 0   methylPREDNISolone (MEDROL DOSEPAK) 4 MG TBPK tablet, Take as directed. Start on the morning of 11/03/22, Disp: 21 tablet, Rfl: 0   propranolol (INDERAL) 20 MG tablet, Take 1 tablet (20 mg total) by mouth 3 (three) times daily., Disp: 90 tablet, Rfl: 3   scopolamine (TRANSDERM-SCOP) 1 MG/3DAYS, Place 1 patch (1.5 mg total) onto the skin every 3 (three) days., Disp: 5 patch, Rfl: 0   tamoxifen (NOLVADEX) 10 MG tablet, Take 1 tablet (10 mg total) by mouth daily., Disp: 30 tablet, Rfl: 5   valACYclovir (VALTREX) 1000 MG tablet, Take two tablets ( total 2000 mg) by mouth q12h x 1 day; Start: ASAP after symptom onset, Disp: 30 tablet, Rfl: 1   VEOZAH 45 MG TABS, TAKE 1 TABLET BY MOUTH EVERY DAY, Disp: 30 tablet, Rfl: 3  Observations/Objective: Patient is well-developed, well-nourished in no acute distress.  Resting comfortably at home.  Head is normocephalic, atraumatic.  No labored breathing.  Speech is clear and coherent with logical content.  Patient is alert and oriented at baseline.    Assessment and  Plan: 1. Acute bacterial sinusitis - amoxicillin-clavulanate (AUGMENTIN) 875-125 MG tablet; Take 1 tablet by mouth 2 (two) times daily.  Dispense: 14 tablet; Refill: 0 - ipratropium (ATROVENT) 0.03 % nasal spray; Place 2 sprays into both nostrils every 12 (twelve) hours.  Dispense: 30 mL; Refill: 0  - Worsening symptoms that have not responded to OTC medications.  - Will give Augmentin and Ipratropium Bromide nasal spray - Continue allergy medications.  - Steam and humidifier can help - Stay well hydrated and get plenty of rest.  - Seek in person evaluation if no symptom improvement or if symptoms worsen   Follow Up Instructions: I discussed the assessment and treatment plan with the patient. The patient was provided an opportunity to ask questions and all were answered. The patient agreed with the plan and demonstrated an understanding of the instructions.  A copy of instructions were sent to the patient via MyChart unless otherwise noted below.    The patient was advised to call back or seek an in-person evaluation if the symptoms worsen  or if the condition fails to improve as anticipated.  Time:  I spent 8 minutes with the patient via telehealth technology discussing the above problems/concerns.    Margaretann Loveless, PA-C

## 2023-08-02 ENCOUNTER — Encounter: Payer: Self-pay | Admitting: Hematology

## 2023-08-02 ENCOUNTER — Other Ambulatory Visit: Payer: Self-pay | Admitting: Hematology

## 2023-08-02 MED ORDER — ESZOPICLONE 3 MG PO TABS: 3.0000 mg | ORAL_TABLET | Freq: Every evening | ORAL | 1 refills | Status: DC | PRN

## 2023-08-19 ENCOUNTER — Other Ambulatory Visit: Payer: Self-pay | Admitting: Hematology

## 2023-08-19 ENCOUNTER — Encounter: Payer: Self-pay | Admitting: Hematology

## 2023-08-19 MED ORDER — MELOXICAM 5 MG PO CAPS: ORAL_CAPSULE | ORAL | 1 refills | Status: DC

## 2023-08-20 ENCOUNTER — Other Ambulatory Visit: Payer: Self-pay

## 2023-08-23 ENCOUNTER — Other Ambulatory Visit: Payer: Self-pay

## 2023-08-23 ENCOUNTER — Other Ambulatory Visit: Payer: Self-pay | Admitting: Hematology

## 2023-08-23 MED ORDER — MELOXICAM 7.5 MG PO TABS: 7.5000 mg | ORAL_TABLET | Freq: Every day | ORAL | 0 refills | Status: DC | PRN

## 2023-08-24 ENCOUNTER — Encounter: Payer: Self-pay | Admitting: *Deleted

## 2023-09-21 ENCOUNTER — Other Ambulatory Visit: Payer: Self-pay | Admitting: Physician Assistant

## 2023-09-21 ENCOUNTER — Encounter: Payer: Self-pay | Admitting: Hematology

## 2023-09-27 ENCOUNTER — Telehealth: Payer: Self-pay

## 2023-09-27 NOTE — Telephone Encounter (Signed)
Patient notified that her [employer] FMLA documents had been completed and faxed back to company. Fax confirmation received. Copy of documents mailed to patient as requested. No further concerns at this time.

## 2023-10-12 ENCOUNTER — Other Ambulatory Visit: Payer: Self-pay | Admitting: Hematology

## 2023-10-12 MED ORDER — MELOXICAM 7.5 MG PO TABS: 7.5000 mg | ORAL_TABLET | Freq: Every day | ORAL | 0 refills | Status: DC | PRN

## 2023-10-13 ENCOUNTER — Other Ambulatory Visit: Payer: Self-pay | Admitting: Nurse Practitioner

## 2023-11-22 ENCOUNTER — Other Ambulatory Visit: Payer: Self-pay | Admitting: Nurse Practitioner

## 2023-11-23 ENCOUNTER — Other Ambulatory Visit: Payer: Self-pay | Admitting: Hematology

## 2023-11-23 ENCOUNTER — Encounter: Payer: Self-pay | Admitting: Hematology

## 2023-11-24 NOTE — Telephone Encounter (Signed)
 Hey. I looked at [employer]  online. There is a coupon offered from the manufacturer. Can you see if the patient has gotten this? I think she can get it off the [employer]  site online. She has been on this for a while and if the copay card will help her, I would rather her stay on this.

## 2023-11-29 ENCOUNTER — Telehealth: Payer: Self-pay | Admitting: Hematology

## 2023-11-29 NOTE — Telephone Encounter (Signed)
 Called to set up a telephone visit per "patient advise message" by Dr.Feng's nurse. Talked with the patient and she has declined setting up a telephone visit. Dr.Feng and her nurse have been notified via 2/10 secure chat.

## 2023-12-11 ENCOUNTER — Encounter (HOSPITAL_BASED_OUTPATIENT_CLINIC_OR_DEPARTMENT_OTHER): Payer: Self-pay | Admitting: Emergency Medicine

## 2023-12-11 ENCOUNTER — Other Ambulatory Visit: Payer: Self-pay

## 2023-12-11 ENCOUNTER — Emergency Department (HOSPITAL_BASED_OUTPATIENT_CLINIC_OR_DEPARTMENT_OTHER)
Admission: EM | Admit: 2023-12-11 | Discharge: 2023-12-12 | Disposition: A | Discharge status: Home / Self Care | Payer: BC Managed Care – PPO | Attending: Emergency Medicine | Admitting: Emergency Medicine

## 2023-12-11 DIAGNOSIS — H1132 Conjunctival hemorrhage, left eye: Secondary | ICD-10-CM | POA: Diagnosis not present

## 2023-12-11 DIAGNOSIS — F419 Anxiety disorder, unspecified: Secondary | ICD-10-CM | POA: Diagnosis present

## 2023-12-11 DIAGNOSIS — I10 Essential (primary) hypertension: Secondary | ICD-10-CM | POA: Insufficient documentation

## 2023-12-11 DIAGNOSIS — Z79899 Other long term (current) drug therapy: Secondary | ICD-10-CM | POA: Insufficient documentation

## 2023-12-11 LAB — CBC WITH DIFFERENTIAL/PLATELET
Abs Immature Granulocytes: 0.01 10*3/uL (ref 0.00–0.07)
Basophils Absolute: 0 10*3/uL (ref 0.0–0.1)
Basophils Relative: 0 %
Eosinophils Absolute: 0.1 10*3/uL (ref 0.0–0.5)
Eosinophils Relative: 1 %
HCT: 42.1 % (ref 36.0–46.0)
Hemoglobin: 13.8 g/dL (ref 12.0–15.0)
Immature Granulocytes: 0 %
Lymphocytes Relative: 42 %
Lymphs Abs: 3.3 10*3/uL (ref 0.7–4.0)
MCH: 28.8 pg (ref 26.0–34.0)
MCHC: 32.8 g/dL (ref 30.0–36.0)
MCV: 87.7 fL (ref 80.0–100.0)
Monocytes Absolute: 0.8 10*3/uL (ref 0.1–1.0)
Monocytes Relative: 10 %
Neutro Abs: 3.8 10*3/uL (ref 1.7–7.7)
Neutrophils Relative %: 47 %
Platelets: 257 10*3/uL (ref 150–400)
RBC: 4.8 MIL/uL (ref 3.87–5.11)
RDW: 14.4 % (ref 11.5–15.5)
WBC: 8 10*3/uL (ref 4.0–10.5)
nRBC: 0 % (ref 0.0–0.2)

## 2023-12-11 LAB — COMPREHENSIVE METABOLIC PANEL
ALT: 7 U/L (ref 0–44)
AST: 13 U/L — ABNORMAL LOW (ref 15–41)
Albumin: 4.6 g/dL (ref 3.5–5.0)
Alkaline Phosphatase: 61 U/L (ref 38–126)
Anion gap: 14 (ref 5–15)
BUN: 10 mg/dL (ref 6–20)
CO2: 23 mmol/L (ref 22–32)
Calcium: 9.5 mg/dL (ref 8.9–10.3)
Chloride: 103 mmol/L (ref 98–111)
Creatinine, Ser: 0.95 mg/dL (ref 0.44–1.00)
GFR, Estimated: >60 mL/min (ref >60)
Glucose, Bld: 102 mg/dL — ABNORMAL HIGH (ref 70–99)
Potassium: 3.2 mmol/L — ABNORMAL LOW (ref 3.5–5.1)
Sodium: 140 mmol/L (ref 135–145)
Total Bilirubin: 0.6 mg/dL (ref 0.0–1.2)
Total Protein: 7.7 g/dL (ref 6.5–8.1)

## 2023-12-11 LAB — URINALYSIS, ROUTINE W REFLEX MICROSCOPIC
Bilirubin Urine: NEGATIVE
Glucose, UA: NEGATIVE mg/dL
Ketones, ur: NEGATIVE mg/dL
Nitrite: NEGATIVE
Protein, ur: NEGATIVE mg/dL
Specific Gravity, Urine: 1.012 (ref 1.005–1.030)
pH: 5.5 (ref 5.0–8.0)

## 2023-12-11 LAB — MAGNESIUM: Magnesium: 1.9 mg/dL (ref 1.7–2.4)

## 2023-12-11 LAB — TROPONIN I (HIGH SENSITIVITY): Troponin I (High Sensitivity): 3 ng/L (ref <18)

## 2023-12-11 MED ORDER — HYDROXYZINE HCL 25 MG PO TABS: 25.0000 mg | ORAL_TABLET | Freq: Four times a day (QID) | ORAL | 0 refills | Status: AC

## 2023-12-11 MED ORDER — POLYMYXIN B-TRIMETHOPRIM 10000-0.1 UNIT/ML-% OP SOLN: 1.0000 [drp] | OPHTHALMIC | 0 refills | Status: DC

## 2023-12-11 MED ORDER — HYDROXYZINE HCL 25 MG PO TABS: 25.0000 mg | ORAL_TABLET | Freq: Four times a day (QID) | ORAL | 0 refills | Status: DC
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
## 2023-12-11 NOTE — ED Triage Notes (Signed)
 Reports high BP Unsure if she took home BP meds Denies symptoms  Does have anxiety and feels nervous

## 2023-12-11 NOTE — Discharge Instructions (Addendum)
 You need to follow-up with your primary doctor about your high heart rate.  You should get your thyroid checked and other studies done if this persists.  If it is all secondary to your anxiety, your doctor can treat this as well.  If you develop chest pain, shortness of breath or any new symptoms, please return to the ED.  Take your blood pressure medicines as prescribed.

## 2023-12-13 ENCOUNTER — Other Ambulatory Visit: Payer: Self-pay | Admitting: Hematology

## 2023-12-13 ENCOUNTER — Other Ambulatory Visit: Payer: Self-pay | Admitting: Physician Assistant

## 2023-12-15 ENCOUNTER — Ambulatory Visit: Payer: BC Managed Care – PPO | Admitting: Physician Assistant

## 2023-12-21 ENCOUNTER — Encounter: Payer: Self-pay | Admitting: Physician Assistant

## 2023-12-21 ENCOUNTER — Other Ambulatory Visit: Payer: Self-pay | Admitting: Hematology

## 2023-12-21 ENCOUNTER — Ambulatory Visit: Payer: BC Managed Care – PPO | Admitting: Physician Assistant

## 2023-12-21 VITALS — BP 130/90 | HR 138 | Temp 98.0°F | Ht 68.5 in | Wt 192.2 lb

## 2023-12-21 DIAGNOSIS — I10 Essential (primary) hypertension: Secondary | ICD-10-CM | POA: Diagnosis not present

## 2023-12-21 DIAGNOSIS — F418 Other specified anxiety disorders: Secondary | ICD-10-CM

## 2023-12-21 DIAGNOSIS — E876 Hypokalemia: Secondary | ICD-10-CM

## 2023-12-21 DIAGNOSIS — Z1231 Encounter for screening mammogram for malignant neoplasm of breast: Secondary | ICD-10-CM

## 2023-12-21 DIAGNOSIS — E559 Vitamin D deficiency, unspecified: Secondary | ICD-10-CM

## 2023-12-21 DIAGNOSIS — E88819 Insulin resistance, unspecified: Secondary | ICD-10-CM | POA: Diagnosis not present

## 2023-12-21 DIAGNOSIS — R0981 Nasal congestion: Secondary | ICD-10-CM

## 2023-12-21 LAB — COMPREHENSIVE METABOLIC PANEL
ALT: 10 U/L (ref 0–35)
AST: 15 U/L (ref 0–37)
Albumin: 4.6 g/dL (ref 3.5–5.2)
Alkaline Phosphatase: 75 U/L (ref 39–117)
BUN: 10 mg/dL (ref 6–23)
CO2: 28 meq/L (ref 19–32)
Calcium: 9.7 mg/dL (ref 8.4–10.5)
Chloride: 105 meq/L (ref 96–112)
Creatinine, Ser: 0.83 mg/dL (ref 0.40–1.20)
GFR: 80.29 mL/min (ref >60.00)
Glucose, Bld: 93 mg/dL (ref 70–99)
Potassium: 4.2 meq/L (ref 3.5–5.1)
Sodium: 142 meq/L (ref 135–145)
Total Bilirubin: 0.6 mg/dL (ref 0.2–1.2)
Total Protein: 7.6 g/dL (ref 6.0–8.3)

## 2023-12-21 LAB — VITAMIN D 25 HYDROXY (VIT D DEFICIENCY, FRACTURES): VITD: 14.31 ng/mL — ABNORMAL LOW (ref 30.00–100.00)

## 2023-12-21 LAB — HEMOGLOBIN A1C: Hgb A1c MFr Bld: 6.3 % (ref 4.6–6.5)

## 2023-12-21 LAB — VITAMIN B12: Vitamin B-12: 437 pg/mL (ref 211–911)

## 2023-12-21 MED ORDER — AMOXICILLIN-POT CLAVULANATE 875-125 MG PO TABS: 1.0000 | ORAL_TABLET | Freq: Two times a day (BID) | ORAL | 0 refills | Status: DC

## 2023-12-21 MED ORDER — AMLODIPINE BESYLATE 5 MG PO TABS
5.0000 mg | ORAL_TABLET | Freq: Every day | ORAL | 1 refills | Status: DC
Start: 2023-12-21 — End: 2024-01-12

## 2023-12-21 NOTE — Patient Instructions (Signed)
 It was great to see you!  Start Augmentin for your sinus infection  I will message oncology about best anxiety medication(s) and be in touch  Start amlodipine 5 mg daily Continue lisinopril 40 mg daily  Let's follow-up in 1 month, sooner if you have concerns.  Take care,  Jarold Motto PA-C

## 2023-12-21 NOTE — Progress Notes (Signed)
 Casey Baker is a 53 y.o. female here for a follow up of a pre-existing problem.  History of Present Illness:   Chief Complaint  Patient presents with   Follow-up    Pt here for f/u was seen in the ED on 2/22 for left eye hemorrhage, and anxiety. Pt has been checking Bp at home 143/100.   Anxiety   Nasal Congestion    Pt c/o nasal congestion x 1 week, clear nasal drainage.   HTN Patient was seen in the 12/11/23 with complains of elevated BP and a small subconjunctival hemorrhage in the left eye. Lab work is all normal except for slightly low potassium.   Lately, her at home BP numbers have been above goal. Highest being 171/97 with best being 143/87. She does report compliance and good tolerance of lisinopril 40 mg. She is agreeable to starting Norvasc.  Patient denies chest pain, SOB, blurred vision, dizziness, unusual headaches, lower leg swelling. Patient is compliant with medication. Denies excessive caffeine intake, stimulant usage, excessive alcohol intake, or increase in salt  Anxiety She states that her anxiety episodes are unmanageable and she can't identify ant possible triggers. Reports compliance and good tolerance of Veozah 45 mg, stating that it has significantly improved her menopause symptoms. She was prescribed Hydroxyzine 25 mg at the ED and states that she feels her symptoms are improved but she also feels drowsy when taking it.  Denies being previously on a daily medication other than xanax. She is interested in starting a daily medication at this time. Denies any depression, suicidal or harmful thoughts.       Nasal Congestion    She also complains today of congestion that has persisted for the past week. Accompanying symptom include clear nasal drainage, runny nose, and a mild sore throat.  Reports taking Flonase and Nyquil.  No recent exposure to viral sickness.  Denies any fever or chills.  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   Family history of breast cancer    Family history of ovarian cancer    Genetic testing 02/21/2018   STAT Breast panel with reflex to Breast/GYN panel (23 genes) @ [facility] - No pathogenic mutations detected   Hypertension    PONV (postoperative nausea and vomiting)    Uterine fibroid    Vaginal delivery 1992, 1994, 1997     Social History   Tobacco Use   Smoking status: Never   Smokeless tobacco: Never  Vaping Use   Vaping status: Never Used  Substance Use Topics   Alcohol use: No   Drug use: No    Past Surgical History:  Procedure Laterality Date   AXILLARY SURGERY     BREAST RECONSTRUCTION WITH PLACEMENT OF TISSUE EXPANDER AND FLEX HD (ACELLULAR HYDRATED DERMIS) Left 03/18/2018   Procedure: LEFT BREAST RECONSTRUCTION WITH PLACEMENT OF TISSUE EXPANDER AND FLEX HD (ACELLULAR HYDRATED DERMIS);  Surgeon: Glenna Fellows, MD;  Location: [facility];  Service: Plastics;  Laterality: Left;   BREAST REDUCTION SURGERY Right 06/14/2020   Procedure: REVISION RIGHT BREAST REDUCTION;  Surgeon: Glenna Fellows, MD;  Location: [facility];  Service: Plastics;  Laterality: Right;   DENTAL SURGERY  N/A 07/08/2018   KNEE ARTHROSCOPY Left 2013   LIPOSUCTION WITH LIPOFILLING Left 03/07/2019   Procedure: LIPOFILLING FROM ABDOMEN TO LEFT CHEST;  Surgeon: Glenna Fellows, MD;  Location: [facility];  Service: Plastics;  Laterality: Left;   LIPOSUCTION WITH LIPOFILLING Bilateral 06/14/2020   Procedure: LIPOSUCTION BILATERAL CHEST WALL AND ABDOMEN; LIPOFILLING LEFT CHEST;  Surgeon: Glenna Fellows, MD;  Location: [facility];  Service: Plastics;  Laterality: Bilateral;   MASTECTOMY Left 2019/5    MASTECTOMY W/ SENTINEL NODE BIOPSY Left 03/18/2018   Procedure: TOTAL LEFT MASTECTOMY WITH AXILLARY SENTINEL LYMPH NODE BIOPSY;  Surgeon: Glenna Fellows, MD;  Location: [facility];  Service: General;  Laterality: Left;   MASTOPEXY Right 03/07/2019   Procedure: RIGHT BREAST MASTOPEXY;  Surgeon: Glenna Fellows, MD;  Location: [facility];  Service: Plastics;  Laterality: Right;   REDUCTION MAMMAPLASTY Right 02/2019   REMOVAL OF TISSUE EXPANDER AND PLACEMENT OF IMPLANT Left 03/07/2019   Procedure: REMOVAL OF LEFT TISSUE EXPANDER AND PLACEMENT OF SILICONE IMPLANT;  Surgeon: Glenna Fellows, MD;  Location: [facility];  Service: Plastics;  Laterality: Left;   TOOTH EXTRACTION N/A 07/08/2018   Procedure: DENTAL RESTORATION/EXTRACTIONS;  Surgeon: Ocie Doyne, DDS;  Location: [facility];  Service: Oral Surgery;  Laterality: N/A;   TUBAL LIGATION      Family History  Problem Relation Age of Onset   Hypertension Mother    Stroke Mother    Ovarian cancer Maternal Grandmother        dx 99s; deceased 60s. She declined therapy of any kind including surgery and chemo   Breast cancer Cousin        dx 35s; currently 59s; daughter of matenral uncle   Leukemia Maternal Aunt        currently 79   Stroke Sister     No Known Allergies  Current Medications:   Current Outpatient Medications:    amLODipine (NORVASC) 5 MG tablet, Take 1 tablet (5 mg total) by mouth daily., Disp: 30 tablet, Rfl: 1   amoxicillin-clavulanate (AUGMENTIN) 875-125 MG tablet, Take 1 tablet by mouth 2 (two) times daily., Disp: 14 tablet, Rfl: 0   Blood Pressure Monitoring (BLOOD PRESSURE MONITOR AUTOMAT) DEVI, TO CHECK BLOOD PRESSURE TWICE DAILY AS DIRECTED, Disp: 1 each, Rfl: 0   eszopiclone 3 MG TABS, Take 1 tablet (3 mg total) by mouth at bedtime as needed. Take immediately before bedtime, Disp: 30 tablet, Rfl: 1   hydrOXYzine (ATARAX) 25 MG tablet, Take 1 tablet (25 mg total) by mouth  every 6 (six) hours., Disp: 12 tablet, Rfl: 0   Lancets 33G MISC, USE TO CHECK BLOOD SUGAR DAILY AND PRN, Disp: 100 each, Rfl: 4   lisinopril (ZESTRIL) 40 MG tablet, TAKE 1 TABLET BY MOUTH EVERY DAY, Disp: 30 tablet, Rfl: 1   meloxicam (MOBIC) 7.5 MG tablet, TAKE 1 TABLET BY MOUTH EVERY DAY AS NEEDED FOR PAIN DO NOT TAKE DAILY FOR MORE THAN A WEEK, Disp: 30 tablet, Rfl: 1   tamoxifen (NOLVADEX) 10 MG tablet, TAKE 1 TABLET BY MOUTH EVERY DAY, Disp: 90 tablet, Rfl: 1   valACYclovir (VALTREX) 1000 MG tablet, Take two tablets ( total 2000 mg) by mouth q12h x 1 day; Start: ASAP after symptom onset, Disp: 30 tablet, Rfl: 1   VEOZAH 45 MG TABS, TAKE 1 TABLET BY MOUTH EVERY DAY, Disp: 30 tablet, Rfl: 3   Review of Systems:   Review of Systems  Constitutional:  Negative for  chills and fever.  HENT:  Positive for congestion and sore throat.   Respiratory:  Positive for sputum production.   Psychiatric/Behavioral:  Negative for depression and suicidal ideas. The patient is nervous/anxious.     Vitals:   Vitals:   12/21/23 1104 12/21/23 1149  BP: (!) 160/80 (!) 130/90  Pulse: (!) 138   Temp: 98 F (36.7 C)   TempSrc: Temporal   SpO2: 99%   Weight: 192 lb 4 oz (87.2 kg)   Height: 5' 8.5" (1.74 m)      Body mass index is 28.81 kg/m.  Physical Exam:   Physical Exam Vitals and nursing note reviewed.  Constitutional:      General: She is not in acute distress.    Appearance: She is well-developed. She is not ill-appearing or toxic-appearing.  HENT:     Head: Normocephalic and atraumatic.     Right Ear: Tympanic membrane, ear canal and external ear normal. Tympanic membrane is not erythematous, retracted or bulging.     Left Ear: Tympanic membrane, ear canal and external ear normal. Tympanic membrane is not erythematous, retracted or bulging.     Nose:     Right Sinus: Frontal sinus tenderness present. No maxillary sinus tenderness.     Left Sinus: Frontal sinus tenderness present. No  maxillary sinus tenderness.     Mouth/Throat:     Pharynx: Uvula midline. No posterior oropharyngeal erythema.  Eyes:     General: Lids are normal.     Conjunctiva/sclera: Conjunctivae normal.  Neck:     Trachea: Trachea normal.  Cardiovascular:     Rate and Rhythm: Normal rate and regular rhythm.     Heart sounds: Normal heart sounds, S1 normal and S2 normal.  Pulmonary:     Effort: Pulmonary effort is normal.     Breath sounds: Normal breath sounds. No decreased breath sounds, wheezing, rhonchi or rales.  Lymphadenopathy:     Cervical: No cervical adenopathy.  Skin:    General: Skin is warm and dry.  Neurological:     Mental Status: She is alert.  Psychiatric:        Speech: Speech normal.        Behavior: Behavior normal. Behavior is cooperative.     Assessment and Plan:   Primary hypertension Above goal today No evidence of end-organ damage on my exam Recommend patient monitor home blood pressure at least a few times weekly Continue lisinopril 40 mg daily and add amlodipine 5 mg daily Update blood work to reassess potassium, she is willing to trial diuretic if she must for her blood pressure  If home monitoring shows consistent elevation, or any symptom(s) develop, recommend reach out to [place] for further advice on next steps Follow-up in 1 month(s)   Hypokalemia Recheck blood work and advise further  Vitamin D deficiency Update vitamin D and provide recommendations   Insulin resistance Update Hemoglobin A1c and provide recommendations   Anxiety with depression Uncontrolled Will message her oncologist about best/safest ssri with tamoxifen Consider cutting hydroxyzine in half for less sedation Follow-up in 1 month(s)   Nasal congestion No red flags on exam.   Will initiate augmentin per orders.  Discussed taking medications as prescribed.  Reviewed return precautions including new or worsening fever, SOB, new or worsening cough or other concerns.  Push fluids  and rest.  I recommend that patient follow-up if symptoms worsen or persist despite treatment x 7-10 days, sooner if needed.     Jarold Motto, PA-C  I,Safa M Kadhim,acting as a scribe for [employer], PA.,have documented all relevant documentation on the behalf of Jarold Motto, PA,as directed by  Jarold Motto, PA while in the presence of Jarold Motto, [place].   I, Jarold Motto, [place], have reviewed all documentation for this visit. The documentation on 12/21/23 for the exam, diagnosis, procedures, and orders are all accurate and complete.

## 2023-12-22 ENCOUNTER — Encounter: Payer: Self-pay | Admitting: Physician Assistant

## 2023-12-22 ENCOUNTER — Other Ambulatory Visit: Payer: Self-pay | Admitting: Physician Assistant

## 2023-12-22 MED ORDER — VITAMIN D (ERGOCALCIFEROL) 1.25 MG (50000 UNIT) PO CAPS: 50000.0000 [IU] | ORAL_CAPSULE | ORAL | 0 refills | Status: AC

## 2023-12-24 ENCOUNTER — Other Ambulatory Visit: Payer: Self-pay | Admitting: Hematology

## 2023-12-24 MED ORDER — ESZOPICLONE 3 MG PO TABS: 3.0000 mg | ORAL_TABLET | Freq: Every evening | ORAL | 0 refills | Status: DC | PRN

## 2024-01-02 NOTE — Progress Notes (Unsigned)
 Patient Care Team: Jarold Motto, [place] as PCP - General (Physician Assistant) Glenna Fellows, MD (Inactive) as Consulting Physician (General Surgery) Malachy Mood, MD as Consulting Physician (Hematology) Antony Blackbird, MD as Consulting Physician (Radiation Oncology) Pollyann Samples, NP as Nurse Practitioner (Nurse Practitioner)   CHIEF COMPLAINT: Follow up left breast DCIS  Oncology History Overview Note  Cancer Staging Ductal carcinoma in situ (DCIS) of left breast Staging form: Breast, AJCC 8th Edition - Clinical stage from 02/09/2018: Stage 0 (cTis (DCIS), cN0, cM0, ER+, PR+) - Unsigned - Pathologic stage from 03/18/2018: Stage IA (pT56mi, pN1a, cM0, G1, ER+, PR+, HER2-) - Signed by Malachy Mood, MD on 03/28/2018     Malignant neoplasm of upper-outer quadrant of left female breast (HCC)  01/27/2018 Mammogram   IMPRESSION: Suspicious bloody left nipple discharge with palpable masses in the 2 o'clock axis of the left breast. Findings are suspicious for Malignancy. ADDENDUM: Magnification views of the retroareolar right breast were performed to evaluate calcifications seen on the patient's baseline mammogram. There are loosely grouped and scattered calcifications in the outer right breast, superior to and directly posterior to the nipple. These calcifications are rounded and smudgy in the CC projection, and many of them demonstrate layering on the 90 degree lateral magnification view, consistent with benign milk of calcium. No suspicious microcalcifications are seen in the right breast.   01/31/2018 Initial Biopsy   Diagnosis 01/31/18 1. 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 - NEGATIVE FOR INVASIVE CARCINOMA. 2. 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 - NEGATIVE FOR INVASIVE  CARCINOMA.   01/31/2018 Receptors her2   Prognostic indicators significant for: ER, 100% positive and PR, 100% positive, both with strong staining intensity.    02/03/2018 Initial Diagnosis   Ductal carcinoma in situ (DCIS) of left breast   02/10/2018 Genetic Testing   Testing did not reveal a pathogenic mutation in any of the genes analyzed. A copy of the genetic test report will be scanned into Epic under the Media tab.  The genes analyzed were the 23 genes on [facility]'s Breast/GYN panel (ATM, BARD1, BRCA1, BRCA2, BRIP1, CDH1, CHEK2, DICER1, EPCAM, MLH1,  MSH2, MSH6, NBN, NF1, PALB2, PMS2, PTEN, RAD50, RAD51C, RAD51D,SMARCA4, STK11, and TP53).  Genetic testing involved analysis of 9 genes: ATM, BRCA1, BRCA2, CDH1, CHEK2, PALB2, PTEN, STK11 and TP53 genes. Testing was normal and did not reveal a mutation in these genes.     03/18/2018 Surgery   TOTAL LEFT MASTECTOMY WITH AXILLARY SENTINEL LYMPH NODE BIOPSY and  LEFT BREAST RECONSTRUCTION WITH PLACEMENT OF TISSUE EXPANDER AND FLEX HD (ACELLULAR HYDRATED DERMIS) by Dr. Johna Sheriff and Dr. Leta Baptist  03/18/18   03/18/2018 Pathology Results   Diagnosis 03/18/18 1. Lymph node, sentinel, biopsy, Left Axillary #1 - METASTATIC CARCINOMA IN ONE OF ONE LYMPH NODES (1/1). 2. Lymph node, sentinel, biopsy, Left Axillary #2 - ONE OF ONE LYMPH NODES NEGATIVE FOR CARCINOMA (0/1). 3. Breast, simple mastectomy, Left Total - MICROINVASIVE DUCTAL CARCINOMA ARISING IN A BACKGROUND OF LOW GRADE DUCTAL CARCINOMA IN SITU. - DUCTAL CARCINOMA IN SITU PARTIALLY INVOLVES AN INTRADUCTAL PAPILLOMA. - RESECTION MARGINS ARE NEGATIVE FOR INVASIVE CARCINOMA. - IN SITU CARCINOMA IS PRESENT AT THE ANTERIOR MARGIN BROADLY. - BIOPSY SITE. - SEE ONCOLOGY TABLE.    03/18/2018 Receptors her2   Estrogen Receptor: 100%, POSITIVE, MODERATE-WEAK STAINING INTENSITY  Progesterone Receptor: 100%, POSITIVE, STRONG STAINING INTENSITY Proliferation Marker Ki67: 1% HER2 - NEGATIVE    03/18/2018 Cancer Staging   Staging form: Breast, AJCC 8th Edition - Pathologic stage from 03/18/2018: Stage IA (pT43mi, pN1a, cM0, G1, ER+, PR+, HER2-) - Signed by Malachy Mood, MD on 03/28/2018   05/18/2018 - 06/27/2018 Radiation Therapy   Adjuvant radiation by Dr. Roselind Messier    06/27/2018 Mammogram   06/27/2018 Mammogram IMPRESSION: Benign appearing right breast calcifications.   Recommendation: Screening right mammogram is suggested in April 2020.   07/19/2018 -  Anti-estrogen oral therapy   Adjuvant tamoxifen     Survivorship   Per Santiago Glad, NP    03/07/2019 Surgery   REMOVAL OF LEFT TISSUE EXPANDER AND PLACEMENT OF SILICONE IMPLANT and LIPOFILLING FROM ABDOMEN TO LEFT CHEST and RIGHT BREAST MASTOPEXY by Dr. Leta Baptist  03/07/19   06/14/2020 Surgery   REVISION RIGHT BREAST REDUCTION and LIPOSUCTION BILATERAL CHEST WALL AND ABDOMEN; LIPOFILLING LEFT CHEST by Dr Leta Baptist       CURRENT THERAPY: Tamoxifen, started 07/2018 x7 years  INTERVAL HISTORY Casey Baker is for follow-up as scheduled, last seen by Dr. Mosetta Putt 07/05/2023.  She is anxious all the time, but it escalates and peaks after January until this visit and her mammogram are complete.  She denies any changes in her breasts or concerns but just worries.  Hot flashes well-managed on Veozah.  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   Family history of breast cancer    Family history of ovarian cancer    Genetic testing 02/21/2018   STAT Breast panel with reflex to Breast/GYN panel (23 genes) @ [facility] - No pathogenic mutations detected   Hypertension    PONV (postoperative nausea and vomiting)    Uterine fibroid    Vaginal delivery 1992, 1994, 1997     Past Surgical History:  Procedure Laterality Date   AXILLARY  SURGERY     BREAST RECONSTRUCTION WITH PLACEMENT OF TISSUE EXPANDER AND FLEX HD (ACELLULAR HYDRATED DERMIS) Left 03/18/2018   Procedure: LEFT BREAST RECONSTRUCTION WITH PLACEMENT OF TISSUE EXPANDER AND FLEX HD (ACELLULAR HYDRATED DERMIS);  Surgeon: Glenna Fellows, MD;  Location: [facility];  Service: Plastics;  Laterality: Left;   BREAST REDUCTION SURGERY Right 06/14/2020   Procedure: REVISION RIGHT BREAST REDUCTION;  Surgeon: Glenna Fellows, MD;  Location: [facility];  Service: Plastics;  Laterality: Right;   DENTAL SURGERY N/A 07/08/2018   KNEE ARTHROSCOPY Left 2013   LIPOSUCTION WITH LIPOFILLING Left 03/07/2019   Procedure: LIPOFILLING FROM ABDOMEN TO LEFT CHEST;  Surgeon: Glenna Fellows, MD;  Location: [facility];  Service: Plastics;  Laterality: Left;   LIPOSUCTION WITH LIPOFILLING Bilateral 06/14/2020   Procedure: LIPOSUCTION BILATERAL CHEST WALL AND ABDOMEN; LIPOFILLING LEFT CHEST;  Surgeon: Glenna Fellows, MD;  Location: [facility];  Service: Plastics;  Laterality: Bilateral;   MASTECTOMY Left 2019/5   MASTECTOMY W/ SENTINEL NODE BIOPSY Left 03/18/2018   Procedure: TOTAL LEFT MASTECTOMY WITH AXILLARY SENTINEL LYMPH NODE BIOPSY;  Surgeon: Glenna Fellows, MD;  Location: [facility];  Service: General;  Laterality: Left;   MASTOPEXY Right 03/07/2019   Procedure: RIGHT BREAST MASTOPEXY;  Surgeon: Glenna Fellows, MD;  Location:   SURGERY CENTER;  Service: Plastics;  Laterality: Right;   REDUCTION MAMMAPLASTY Right 02/2019   REMOVAL OF TISSUE EXPANDER AND PLACEMENT OF IMPLANT Left 03/07/2019   Procedure: REMOVAL OF LEFT TISSUE EXPANDER AND PLACEMENT OF SILICONE IMPLANT;  Surgeon: Glenna Fellows, MD;  Location: [facility];  Service: Plastics;  Laterality: Left;   TOOTH EXTRACTION N/A 07/08/2018   Procedure: DENTAL RESTORATION/EXTRACTIONS;  Surgeon: Ocie Doyne, DDS;  Location: [facility];   Service: Oral Surgery;  Laterality: N/A;   TUBAL LIGATION       Outpatient Encounter Medications as of 01/03/2024  Medication Sig   amLODipine (NORVASC) 5 MG tablet Take 1 tablet (5 mg total) by mouth daily.   amoxicillin-clavulanate (AUGMENTIN) 875-125 MG tablet Take 1 tablet by mouth 2 (two) times daily.   Blood Pressure Monitoring (BLOOD PRESSURE MONITOR AUTOMAT) DEVI TO CHECK BLOOD PRESSURE TWICE DAILY AS DIRECTED   Eszopiclone 3 MG TABS Take 1 tablet (3 mg total) by mouth at bedtime as needed.   hydrOXYzine (ATARAX) 25 MG tablet Take 1 tablet (25 mg total) by mouth every 6 (six) hours.   Lancets 33G MISC USE TO CHECK BLOOD SUGAR DAILY AND PRN   lisinopril (ZESTRIL) 40 MG tablet TAKE 1 TABLET BY MOUTH EVERY DAY   meloxicam (MOBIC) 7.5 MG tablet TAKE 1 TABLET BY MOUTH EVERY DAY AS NEEDED FOR PAIN DO NOT TAKE DAILY FOR MORE THAN A WEEK   tamoxifen (NOLVADEX) 10 MG tablet TAKE 1 TABLET BY MOUTH EVERY DAY   valACYclovir (VALTREX) 1000 MG tablet Take two tablets ( total 2000 mg) by mouth q12h x 1 day; Start: ASAP after symptom onset   VEOZAH 45 MG TABS TAKE 1 TABLET BY MOUTH EVERY DAY   Vitamin D, Ergocalciferol, (DRISDOL) 1.25 MG (50000 UNIT) CAPS capsule Take 1 capsule (50,000 Units total) by mouth every 7 (seven) days.   No facility-administered encounter medications on file as of 01/03/2024.     There were no vitals filed for this visit. There is no height or weight on file to calculate BMI.   PHYSICAL EXAM GENERAL:alert, no distress and comfortable SKIN: no rash  EYES: sclera clear NECK: without mass LYMPH:  no palpable cervical or supraclavicular lymphadenopathy  LUNGS: normal breathing effort HEART: no lower extremity edema ABDOMEN: abdomen soft, non-tender  NEURO: alert & oriented x 3 with fluent speech, no focal motor/sensory deficits Breast exam: S/p left mastectomy and bilateral reconstruction, incisions completely healed.  Right breast without nipple discharge or  inversion.  Palpable scar tissue and dense tissue without discrete mass or any nodularity that I could appreciate.  Bilateral axilla and left breast are benign   CBC    Component Value Date/Time   WBC 8.0 12/11/2023 2249   RBC 4.80 12/11/2023 2249   HGB 13.8 12/11/2023 2249   HGB 14.0 07/05/2023 1042   HCT 42.1 12/11/2023 2249   PLT 257 12/11/2023 2249   PLT 270 07/05/2023 1042   MCV 87.7 12/11/2023 2249   MCH 28.8 12/11/2023 2249   MCHC 32.8 12/11/2023 2249   RDW 14.4 12/11/2023 2249   LYMPHSABS 3.3 12/11/2023 2249   MONOABS 0.8 12/11/2023 2249   EOSABS 0.1 12/11/2023 2249   BASOSABS 0.0 12/11/2023 2249     CMP     Component Value Date/Time   NA 142 12/21/2023 1201   K 4.2 12/21/2023 1201   CL 105 12/21/2023 1201   CO2 28 12/21/2023 1201   GLUCOSE 93 12/21/2023 1201   BUN 10 12/21/2023  1201   CREATININE 0.83 12/21/2023 1201   CREATININE 0.92 07/05/2023 1042   CALCIUM 9.7 12/21/2023 1201   PROT 7.6 12/21/2023 1201   ALBUMIN 4.6 12/21/2023 1201   AST 15 12/21/2023 1201   AST 16 07/05/2023 1042   ALT 10 12/21/2023 1201   ALT 10 07/05/2023 1042   ALKPHOS 75 12/21/2023 1201   BILITOT 0.6 12/21/2023 1201   BILITOT 0.4 07/05/2023 1042   GFRNONAA >60 12/11/2023 2249   GFRNONAA >60 07/05/2023 1042   GFRAA >60 02/12/2020 1329     ASSESSMENT & PLAN:Casey Baker is a 53 y.o. 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  S/p left total mastectomy and breast reconstruction and radiation. Currently on adjuvant Tamoxifen. -Only tolerating tamoxifen 10 mg, reduced due to her insomnia and hot flashes (controlled with Veozah). Goal 7 years -Casey Baker is clinically doing well, tolerating low-dose tamoxifen, exam is benign, labs are stable. We reviewed intermittent leukopenia/neutropenia and precautions.  - Overall no  clinical concern for recurrence -Continue breast cancer surveillance and tamoxifen, mammo is scheduled this week -Follow-up in a year, or sooner if needed   Anxiety with depression -She uses Xanax as needed in the past for anxiety and insomnia -Due to the drowsiness from Xanax, she switched to Lunesta  -Anxiety peaks a few months before her surveillance visit and mammogram, I validated  -PCP recently saw this for this and plans to start her on something safe with tamoxifen, I will message her    PLAN: -Labs reviewed -Continue breast cancer surveillance and tamoxifen -Mammogram 3/19 as scheduled -I will message PCP re: anti-anxiety agents safe with tamoxifen -F/up in 1 year, or sooner if needed    All questions were answered. The patient knows to call the clinic with any problems, questions or concerns. No barriers to learning were detected. I spent 20 minutes counseling the patient face to face. The total time spent in the appointment was 30 minutes and more than 50% was on counseling, review of test results, and coordination of care.   Santiago Glad, NP-C 01/03/2024

## 2024-01-03 ENCOUNTER — Encounter: Payer: Self-pay | Admitting: Nurse Practitioner

## 2024-01-03 ENCOUNTER — Encounter: Payer: Self-pay | Admitting: Physician Assistant

## 2024-01-03 ENCOUNTER — Inpatient Hospital Stay: Payer: BC Managed Care – PPO | Attending: Nurse Practitioner

## 2024-01-03 ENCOUNTER — Inpatient Hospital Stay (HOSPITAL_BASED_OUTPATIENT_CLINIC_OR_DEPARTMENT_OTHER): Payer: BC Managed Care – PPO | Admitting: Nurse Practitioner

## 2024-01-03 VITALS — BP 134/83 | HR 130 | Temp 98.0°F | Resp 18 | Ht 68.5 in | Wt 195.3 lb

## 2024-01-03 DIAGNOSIS — F32A Depression, unspecified: Secondary | ICD-10-CM | POA: Insufficient documentation

## 2024-01-03 DIAGNOSIS — Z79810 Long term (current) use of selective estrogen receptor modulators (SERMs): Secondary | ICD-10-CM | POA: Insufficient documentation

## 2024-01-03 DIAGNOSIS — C50412 Malignant neoplasm of upper-outer quadrant of left female breast: Secondary | ICD-10-CM | POA: Diagnosis not present

## 2024-01-03 DIAGNOSIS — F419 Anxiety disorder, unspecified: Secondary | ICD-10-CM | POA: Insufficient documentation

## 2024-01-03 DIAGNOSIS — Z9012 Acquired absence of left breast and nipple: Secondary | ICD-10-CM | POA: Diagnosis not present

## 2024-01-03 DIAGNOSIS — Z170 Estrogen receptor positive status [ER+]: Secondary | ICD-10-CM | POA: Diagnosis not present

## 2024-01-03 DIAGNOSIS — Z923 Personal history of irradiation: Secondary | ICD-10-CM | POA: Insufficient documentation

## 2024-01-03 LAB — CMP (CANCER CENTER ONLY)
ALT: 9 U/L (ref 0–44)
AST: 13 U/L — ABNORMAL LOW (ref 15–41)
Albumin: 4.1 g/dL (ref 3.5–5.0)
Alkaline Phosphatase: 69 U/L (ref 38–126)
Anion gap: 6 (ref 5–15)
BUN: 14 mg/dL (ref 6–20)
CO2: 28 mmol/L (ref 22–32)
Calcium: 8.8 mg/dL — ABNORMAL LOW (ref 8.9–10.3)
Chloride: 107 mmol/L (ref 98–111)
Creatinine: 0.88 mg/dL (ref 0.44–1.00)
GFR, Estimated: >60 mL/min (ref >60)
Glucose, Bld: 96 mg/dL (ref 70–99)
Potassium: 4.2 mmol/L (ref 3.5–5.1)
Sodium: 141 mmol/L (ref 135–145)
Total Bilirubin: 0.5 mg/dL (ref 0.0–1.2)
Total Protein: 7 g/dL (ref 6.5–8.1)

## 2024-01-03 LAB — CBC WITH DIFFERENTIAL (CANCER CENTER ONLY)
Abs Immature Granulocytes: 0.01 10*3/uL (ref 0.00–0.07)
Basophils Absolute: 0 10*3/uL (ref 0.0–0.1)
Basophils Relative: 1 %
Eosinophils Absolute: 0 10*3/uL (ref 0.0–0.5)
Eosinophils Relative: 1 %
HCT: 40.9 % (ref 36.0–46.0)
Hemoglobin: 13.8 g/dL (ref 12.0–15.0)
Immature Granulocytes: 0 %
Lymphocytes Relative: 56 %
Lymphs Abs: 2 10*3/uL (ref 0.7–4.0)
MCH: 28.6 pg (ref 26.0–34.0)
MCHC: 33.7 g/dL (ref 30.0–36.0)
MCV: 84.7 fL (ref 80.0–100.0)
Monocytes Absolute: 0.4 10*3/uL (ref 0.1–1.0)
Monocytes Relative: 11 %
Neutro Abs: 1.1 10*3/uL — ABNORMAL LOW (ref 1.7–7.7)
Neutrophils Relative %: 31 %
Platelet Count: 253 10*3/uL (ref 150–400)
RBC: 4.83 MIL/uL (ref 3.87–5.11)
RDW: 14.2 % (ref 11.5–15.5)
WBC Count: 3.6 10*3/uL — ABNORMAL LOW (ref 4.0–10.5)
nRBC: 0 % (ref 0.0–0.2)

## 2024-01-04 MED ORDER — SERTRALINE HCL 25 MG PO TABS: 25.0000 mg | ORAL_TABLET | Freq: Every day | ORAL | 2 refills | Status: DC

## 2024-01-05 ENCOUNTER — Ambulatory Visit
Admission: RE | Admit: 2024-01-05 | Discharge: 2024-01-05 | Disposition: A | Discharge status: Home / Self Care | Source: Ambulatory Visit | Attending: Hematology | Admitting: Hematology

## 2024-01-05 DIAGNOSIS — Z1231 Encounter for screening mammogram for malignant neoplasm of breast: Secondary | ICD-10-CM

## 2024-01-12 ENCOUNTER — Other Ambulatory Visit: Payer: Self-pay | Admitting: Physician Assistant

## 2024-01-17 ENCOUNTER — Other Ambulatory Visit: Payer: Self-pay | Admitting: Nurse Practitioner

## 2024-01-17 ENCOUNTER — Other Ambulatory Visit: Payer: Self-pay | Admitting: Hematology

## 2024-01-27 ENCOUNTER — Ambulatory Visit: Admitting: Physician Assistant

## 2024-01-28 ENCOUNTER — Other Ambulatory Visit: Payer: Self-pay | Admitting: *Deleted

## 2024-01-28 MED ORDER — SERTRALINE HCL 25 MG PO TABS: 25.0000 mg | ORAL_TABLET | Freq: Every day | ORAL | 1 refills | Status: DC

## 2024-02-17 ENCOUNTER — Other Ambulatory Visit: Payer: Self-pay | Admitting: Physician Assistant

## 2024-03-29 ENCOUNTER — Other Ambulatory Visit: Payer: Self-pay | Admitting: Nurse Practitioner

## 2024-03-29 ENCOUNTER — Other Ambulatory Visit: Payer: Self-pay | Admitting: Physician Assistant

## 2024-04-14 ENCOUNTER — Other Ambulatory Visit: Payer: Self-pay | Admitting: Nurse Practitioner

## 2024-04-14 ENCOUNTER — Encounter: Payer: Self-pay | Admitting: Hematology

## 2024-04-14 MED ORDER — ESZOPICLONE 3 MG PO TABS: 3.0000 mg | ORAL_TABLET | Freq: Every evening | ORAL | 2 refills | Status: DC | PRN

## 2024-04-18 ENCOUNTER — Encounter: Payer: Self-pay | Admitting: Hematology

## 2024-04-20 ENCOUNTER — Encounter: Payer: Self-pay | Admitting: Nurse Practitioner

## 2024-04-20 ENCOUNTER — Inpatient Hospital Stay: Attending: Hematology | Admitting: Licensed Clinical Social Worker

## 2024-04-20 DIAGNOSIS — Z170 Estrogen receptor positive status [ER+]: Secondary | ICD-10-CM

## 2024-04-20 NOTE — Progress Notes (Signed)
 [facility] CSW Progress Note  Clinical Child psychotherapist contacted patient by phone to follow-up on questions regarding applying for FMLA and disability.    Interventions: CSW instructed pt to reach out to her HR department through her employer to obtain the paperwork which they will require to be completed.  Instructions given regarding submitting that paperwork at the [facility] to be completed.  Contact details for CSW provided should pt have additional questions.        Follow Up Plan:  Patient will contact CSW with any support or resource needs    Casey JONELLE Manna, LCSW Clinical Social Worker [facility]    Patient is participating in a Managed Medicaid Plan:  Yes

## 2024-04-24 ENCOUNTER — Encounter: Payer: Self-pay | Admitting: Nurse Practitioner

## 2024-04-25 ENCOUNTER — Encounter: Payer: Self-pay | Admitting: Nurse Practitioner

## 2024-04-25 ENCOUNTER — Other Ambulatory Visit: Payer: Self-pay | Admitting: Physician Assistant

## 2024-05-10 ENCOUNTER — Encounter: Payer: Self-pay | Admitting: Nurse Practitioner

## 2024-05-12 ENCOUNTER — Telehealth: Payer: Self-pay

## 2024-05-12 NOTE — Telephone Encounter (Signed)
 Notified the pt regarding here FMLA being completed,faxed, with confirmation received. Pt will be picking up her copy. No questions or concern to be noted at this time.

## 2024-05-17 ENCOUNTER — Telehealth: Payer: Self-pay | Admitting: Nurse Practitioner

## 2024-05-17 NOTE — Telephone Encounter (Signed)
 Called to get the patient per staff message. Unable to leave a VM due to the patient mailbox being full.

## 2024-05-18 ENCOUNTER — Telehealth: Payer: Self-pay

## 2024-05-18 ENCOUNTER — Telehealth: Payer: Self-pay | Admitting: *Deleted

## 2024-05-18 NOTE — Telephone Encounter (Signed)
 Casey Baker Evangelist, [phone] (home) calling to speak with staff member who completed my form.  Employer needs further information. Question number # 4 on page three reads 01/03/2024 but should read 04/20/2024.  I received a call for the visit on 04/20/2024. Unable to assist.  Will notify other forms staff member.

## 2024-05-18 NOTE — Telephone Encounter (Signed)
 Pt called regarding her FMLA form, was missing an appointment date. The date is not appearing in epic.I was able to go into her appointment and found that she actually had another appointment the same day. That appointment you 'er able to see, but not the first appointment, I added the date and resubmit the form to the third party company. Pt is aware of the changes made. No questions or concerns to be noted at this time.

## 2024-06-12 ENCOUNTER — Telehealth: Payer: Self-pay | Admitting: *Deleted

## 2024-06-12 ENCOUNTER — Other Ambulatory Visit: Payer: Self-pay | Admitting: Nurse Practitioner

## 2024-06-12 NOTE — Telephone Encounter (Signed)
 Medication Prior Authorization Status  Processed CoverMyMeds KEY:  [id number] for Veozah  45MG  tablets  Submitted Today  Per [employer] PA  PA Case ID:  [id number]   Effective 06/12/2024 through ?SABRA

## 2024-06-23 ENCOUNTER — Other Ambulatory Visit: Payer: Self-pay | Admitting: Physician Assistant

## 2024-07-08 ENCOUNTER — Other Ambulatory Visit: Payer: Self-pay | Admitting: Physician Assistant

## 2024-08-11 ENCOUNTER — Other Ambulatory Visit: Payer: Self-pay | Admitting: Nurse Practitioner

## 2024-08-24 ENCOUNTER — Other Ambulatory Visit: Payer: Self-pay | Admitting: Physician Assistant

## 2024-09-20 ENCOUNTER — Other Ambulatory Visit: Payer: Self-pay | Admitting: Physician Assistant

## 2024-09-20 ENCOUNTER — Other Ambulatory Visit: Payer: Self-pay | Admitting: Hematology

## 2024-09-20 ENCOUNTER — Other Ambulatory Visit: Payer: Self-pay | Admitting: Nurse Practitioner

## 2024-10-16 ENCOUNTER — Other Ambulatory Visit: Payer: Self-pay | Admitting: Nurse Practitioner

## 2024-10-26 ENCOUNTER — Telehealth

## 2024-11-05 ENCOUNTER — Telehealth: Admitting: Family

## 2024-11-05 DIAGNOSIS — B9689 Other specified bacterial agents as the cause of diseases classified elsewhere: Secondary | ICD-10-CM | POA: Diagnosis not present

## 2024-11-05 DIAGNOSIS — J208 Acute bronchitis due to other specified organisms: Secondary | ICD-10-CM | POA: Diagnosis not present

## 2024-11-05 MED ORDER — PREDNISONE 10 MG (21) PO TBPK: ORAL_TABLET | ORAL | 0 refills | Status: AC

## 2024-11-05 MED ORDER — AZITHROMYCIN 250 MG PO TABS: ORAL_TABLET | ORAL | 0 refills | Status: AC

## 2024-11-05 MED ORDER — BENZONATATE 200 MG PO CAPS: 200.0000 mg | ORAL_CAPSULE | Freq: Two times a day (BID) | ORAL | 0 refills | Status: AC | PRN

## 2024-11-05 NOTE — Progress Notes (Signed)
 " Virtual Visit Consent   Casey Baker Evangelist, you are scheduled for a virtual visit with a [facility] provider today. Just as with appointments in the office, your consent must be obtained to participate. Your consent will be active for this visit and any virtual visit you may have with one of our providers in the next 365 days. If you have a MyChart account, a copy of this consent can be sent to you electronically.  As this is a virtual visit, video technology does not allow for your provider to perform a traditional examination. This may limit your provider's ability to fully assess your condition. If your provider identifies any concerns that need to be evaluated in person or the need to arrange testing (such as labs, EKG, etc.), we will make arrangements to do so. Although advances in technology are sophisticated, we cannot ensure that it will always work on either your end or our end. If the connection with a video visit is poor, the visit may have to be switched to a telephone visit. With either a video or telephone visit, we are not always able to ensure that we have a secure connection.  By engaging in this virtual visit, you consent to the provision of healthcare and authorize for your insurance to be billed (if applicable) for the services provided during this visit. Depending on your insurance coverage, you may receive a charge related to this service.  I need to obtain your verbal consent now. Are you willing to proceed with your visit today? Casey Baker has provided verbal consent on 11/05/2024 for a virtual visit (video or telephone). Bari Learn, FNP  Date: 11/05/2024 8:27 AM   Virtual Visit via Video Note   I, Bari Learn, connected with  Casey Baker  ([id number], April 04, 1971) on 11/05/24 at  8:30 AM EST by a video-enabled telemedicine application and verified that I am speaking with the correct person using two identifiers.  Location: Patient: Virtual Visit Location  Patient: Home Provider: Virtual Visit Location Provider: Home Office   I discussed the limitations of evaluation and management by telemedicine and the availability of in person appointments. The patient expressed understanding and agreed to proceed.    History of Present Illness: Casey Baker is a 54 y.o. who identifies as a female who was assigned female at birth, and is being seen today for cough and congestion that started three weeks ago. Reports she was feeling better, but starting to feel bad again over the last two days.   HPI: Cough This is a new problem. The current episode started 1 to 4 weeks ago. The problem has been unchanged. The problem occurs every few minutes. The cough is Productive of purulent sputum and productive of sputum. Associated symptoms include nasal congestion, postnasal drip, shortness of breath and wheezing. Pertinent negatives include no chills, ear congestion, ear pain, fever, headaches, myalgias or sore throat. She has tried rest and OTC cough suppressant for the symptoms. The treatment provided mild relief.    Problems:  Patient Active Problem List   Diagnosis Date Noted   Insulin resistance 12/17/2020   Breast cancer, left breast (HCC) 03/18/2018   Genetic testing 02/21/2018   Family history of breast cancer    Family history of ovarian cancer    Iron  deficiency anemia due to chronic blood loss 02/09/2018   Malignant neoplasm of upper-outer quadrant of left female breast (HCC) 02/03/2018   Breast wound, left, subsequent encounter 01/12/2018   Hypertension  Anxiety with depression    Uterine fibroid 09/29/2014   Menorrhagia 09/29/2014   Anemia 09/29/2014   Sinus tachycardia     Allergies: Allergies[1] Medications: Current Medications[2]  Observations/Objective: Patient is well-developed, well-nourished in no acute distress.  Resting comfortably  at home.  Head is normocephalic, atraumatic.  No labored breathing.  Speech is clear and  coherent with logical content.  Patient is alert and oriented at baseline.  Dry nonproductive   Assessment and Plan: 1. Acute bacterial bronchitis (Primary) - predniSONE  (STERAPRED UNI-PAK 21 TAB) 10 MG (21) TBPK tablet; Use as directed  Dispense: 21 tablet; Refill: 0 - benzonatate  (TESSALON ) 200 MG capsule; Take 1 capsule (200 mg total) by mouth 2 (two) times daily as needed for cough.  Dispense: 20 capsule; Refill: 0 - azithromycin  (ZITHROMAX ) 250 MG tablet; Take 500 mg once, then 250 mg for four days  Dispense: 6 tablet; Refill: 0  - Take meds as prescribed - Use a cool mist humidifier  -Use saline nose sprays frequently -Force fluids -For any cough or congestion  Use plain Mucinex - regular strength or max strength is fine -For fever or aces or pains- take tylenol  or ibuprofen . -Throat lozenges if help -Follow up if symptoms worsen or do not improve  Follow Up Instructions: I discussed the assessment and treatment plan with the patient. The patient was provided an opportunity to ask questions and all were answered. The patient agreed with the plan and demonstrated an understanding of the instructions.  A copy of instructions were sent to the patient via MyChart unless otherwise noted below.     The patient was advised to call back or seek an in-person evaluation if the symptoms worsen or if the condition fails to improve as anticipated.    Bari Learn, FNP    [1] No Known Allergies [2]  Current Outpatient Medications:    azithromycin  (ZITHROMAX ) 250 MG tablet, Take 500 mg once, then 250 mg for four days, Disp: 6 tablet, Rfl: 0   benzonatate  (TESSALON ) 200 MG capsule, Take 1 capsule (200 mg total) by mouth 2 (two) times daily as needed for cough., Disp: 20 capsule, Rfl: 0   predniSONE  (STERAPRED UNI-PAK 21 TAB) 10 MG (21) TBPK tablet, Use as directed, Disp: 21 tablet, Rfl: 0   amLODipine  (NORVASC ) 5 MG tablet, TAKE 1 TABLET (5 MG TOTAL) BY MOUTH DAILY., Disp: 90 tablet, Rfl:  1   Blood Pressure Monitoring (BLOOD PRESSURE MONITOR AUTOMAT) DEVI, TO CHECK BLOOD PRESSURE TWICE DAILY AS DIRECTED, Disp: 1 each, Rfl: 0   Eszopiclone  3 MG TABS, TAKE 1 TABLET (3 MG TOTAL) BY MOUTH AT BEDTIME AS NEEDED., Disp: 30 tablet, Rfl: 2   hydrOXYzine  (ATARAX ) 25 MG tablet, Take 1 tablet (25 mg total) by mouth every 6 (six) hours., Disp: 12 tablet, Rfl: 0   Lancets 33G MISC, USE TO CHECK BLOOD SUGAR DAILY AND PRN, Disp: 100 each, Rfl: 4   lisinopril  (ZESTRIL ) 40 MG tablet, TAKE 1 TABLET BY MOUTH EVERY DAY, Disp: 90 tablet, Rfl: 0   meloxicam  (MOBIC ) 7.5 MG tablet, TAKE 1 TABLET BY MOUTH EVERY DAY AS NEEDED FOR PAIN DO NOT TAKE DAILY FOR MORE THAN A WEEK, Disp: 30 tablet, Rfl: 1   sertraline  (ZOLOFT ) 25 MG tablet, TAKE 1 TABLET (25 MG TOTAL) BY MOUTH DAILY., Disp: 90 tablet, Rfl: 1   tamoxifen  (NOLVADEX ) 10 MG tablet, TAKE 1 TABLET BY MOUTH EVERY DAY, Disp: 90 tablet, Rfl: 1   valACYclovir  (VALTREX ) 1000 MG tablet, Take two tablets (  total 2000 mg) by mouth q12h x 1 day; Start: ASAP after symptom onset, Disp: 30 tablet, Rfl: 1   VEOZAH  45 MG TABS, TAKE 1 TABLET BY MOUTH EVERY DAY, Disp: 30 tablet, Rfl: 3   Vitamin D , Ergocalciferol , (DRISDOL ) 1.25 MG (50000 UNIT) CAPS capsule, Take 1 capsule (50,000 Units total) by mouth every 7 (seven) days., Disp: 12 capsule, Rfl: 0  "

## 2024-11-05 NOTE — Patient Instructions (Signed)

## 2025-01-12 ENCOUNTER — Inpatient Hospital Stay

## 2025-01-12 ENCOUNTER — Inpatient Hospital Stay: Admitting: Nurse Practitioner
